# Patient Record
Sex: Male | Born: 1940 | Race: Black or African American | Hispanic: No | Marital: Married | Smoking: Never smoker
Health system: Southern US, Community
[De-identification: ages and names within clinical notes are randomized; demographics above are authoritative.]

## PROBLEM LIST (undated history)

## (undated) DIAGNOSIS — IMO0001 Reserved for inherently not codable concepts without codable children: Secondary | ICD-10-CM

## (undated) DIAGNOSIS — C801 Malignant (primary) neoplasm, unspecified: Secondary | ICD-10-CM

## (undated) DIAGNOSIS — IMO0002 Reserved for concepts with insufficient information to code with codable children: Secondary | ICD-10-CM

## (undated) DIAGNOSIS — I639 Cerebral infarction, unspecified: Secondary | ICD-10-CM

## (undated) DIAGNOSIS — I4891 Unspecified atrial fibrillation: Secondary | ICD-10-CM

## (undated) DIAGNOSIS — E7800 Pure hypercholesterolemia, unspecified: Secondary | ICD-10-CM

## (undated) DIAGNOSIS — N189 Chronic kidney disease, unspecified: Secondary | ICD-10-CM

## (undated) DIAGNOSIS — M1990 Unspecified osteoarthritis, unspecified site: Secondary | ICD-10-CM

## (undated) DIAGNOSIS — I2510 Atherosclerotic heart disease of native coronary artery without angina pectoris: Secondary | ICD-10-CM

## (undated) DIAGNOSIS — I10 Essential (primary) hypertension: Secondary | ICD-10-CM

## (undated) HISTORY — DX: Reserved for concepts with insufficient information to code with codable children: IMO0002

## (undated) HISTORY — PX: KNEE ARTHROSCOPY: SUR90

## (undated) HISTORY — PX: JOINT REPLACEMENT: SHX530

## (undated) HISTORY — DX: Pure hypercholesterolemia, unspecified: E78.00

## (undated) HISTORY — PX: OTHER SURGICAL HISTORY: SHX169

## (undated) HISTORY — DX: Unspecified osteoarthritis, unspecified site: M19.90

## (undated) HISTORY — DX: Essential (primary) hypertension: I10

## (undated) HISTORY — DX: Reserved for inherently not codable concepts without codable children: IMO0001

---

## 1999-09-11 ENCOUNTER — Other Ambulatory Visit: Admission: RE | Admit: 1999-09-11 | Discharge: 1999-09-11 | Payer: Self-pay | Admitting: Urology

## 1999-12-11 ENCOUNTER — Encounter: Payer: Self-pay | Admitting: Family Medicine

## 1999-12-11 ENCOUNTER — Ambulatory Visit (HOSPITAL_COMMUNITY): Admission: RE | Admit: 1999-12-11 | Discharge: 1999-12-11 | Payer: Self-pay | Admitting: Family Medicine

## 2000-07-21 ENCOUNTER — Encounter (INDEPENDENT_AMBULATORY_CARE_PROVIDER_SITE_OTHER): Payer: Self-pay | Admitting: Specialist

## 2000-07-21 ENCOUNTER — Other Ambulatory Visit: Admission: RE | Admit: 2000-07-21 | Discharge: 2000-07-21 | Payer: Self-pay | Admitting: Urology

## 2001-06-28 ENCOUNTER — Ambulatory Visit (HOSPITAL_COMMUNITY): Admission: RE | Admit: 2001-06-28 | Discharge: 2001-06-28 | Payer: Self-pay | Admitting: Family Medicine

## 2001-06-28 ENCOUNTER — Encounter: Payer: Self-pay | Admitting: Family Medicine

## 2001-08-02 ENCOUNTER — Ambulatory Visit (HOSPITAL_COMMUNITY): Admission: RE | Admit: 2001-08-02 | Discharge: 2001-08-02 | Payer: Self-pay | Admitting: Family Medicine

## 2001-08-02 ENCOUNTER — Encounter: Payer: Self-pay | Admitting: Family Medicine

## 2006-11-01 ENCOUNTER — Ambulatory Visit: Payer: Self-pay | Admitting: Gastroenterology

## 2006-11-19 ENCOUNTER — Emergency Department: Payer: Self-pay

## 2008-04-20 ENCOUNTER — Emergency Department: Payer: Self-pay | Admitting: Emergency Medicine

## 2010-05-18 ENCOUNTER — Ambulatory Visit
Admission: RE | Admit: 2010-05-18 | Discharge: 2010-05-18 | Payer: Self-pay | Source: Home / Self Care | Attending: Urology | Admitting: Urology

## 2010-05-18 LAB — POCT I-STAT 4, (NA,K, GLUC, HGB,HCT)
Glucose, Bld: 87 mg/dL (ref 70–99)
HCT: 42 % (ref 39.0–52.0)
Hemoglobin: 14.3 g/dL (ref 13.0–17.0)
Potassium: 4 mEq/L (ref 3.5–5.1)
Sodium: 143 mEq/L (ref 135–145)

## 2010-05-18 NOTE — Op Note (Signed)
  Mark [name], Mark [name]             ACCOUNT NO.:  [id number]  MEDICAL RECORD NO.:  [id number]          PATIENT TYPE:  AMB  LOCATION:  [facility]                         FACILITY:  [facility]  PHYSICIAN:  Mark C. Vernie Ammons, M.D.  DATE OF BIRTH:  [birth year]  DATE OF PROCEDURE:  05/18/2010 DATE OF DISCHARGE:                              OPERATIVE REPORT   PREOPERATIVE DIAGNOSIS:  Elevated prostate-specific antigen.  POSTOPERATIVE DIAGNOSIS:  Elevated prostate-specific antigen.  PROCEDURE:  Transrectal ultrasound and biopsy of the prostate.  SURGEON:  Mark C. Vernie Ammons, M.D.  ANESTHESIA:  General.  BLOOD LOSS:  Minimal.  SPECIMENS:  Prostate cores to Pathology.  COMPLICATIONS:  None.  INDICATIONS:  The patient is a 70 year old male with a long history of elevated PSA.  He underwent transrectal ultrasound and biopsy of his prostate first in 2000 and then 2002 and finally a third time in January 2010 for an elevated PSA.  His last biopsy was due to a PSA of 20.3 while on a 5-alpha-reductase inhibitor.  None of the biopsies revealed any abnormality.  He does have an enlarged prostate and we therefore have discussed a repeat biopsy with cores obtained from his transition zone in order to rule out a transition zone cancer.  His most recent PSA was greater than 30 on Avodart.  The risks, complications, alternatives, and limitations have been discussed.  He understands and elected to proceed.  DESCRIPTION OF OPERATION:  After informed consent, the patient was brought to the major OR, placed on the table, and administered general anesthesia and then moved to the lateral position.  He had received preoperative antibiotics, and I proceeded with transrectal ultrasound using a 10 MHz probe.  Scanning from base to apex in the axial plane, I note hypertrophy of the transition zone.  The peripheral zone appeared to be free of hyper or hypoechoic lesions.  The capsule appears intact throughout.   Additional scanning sagittally revealed no further findings.  The seminal vesicles appear symmetric and uninvolved with disease process.  His prostate length measured 5.67 cm, the height was 4.66 cm, and the width was 5.96 cm, giving a prostate volume of 82.6 cc.  Transrectal ultrasound-guided biopsies were then obtained.  I obtained 12 cores in the standard fashion, but all of these were obtained by advancing the needle through the peripheral zone and into the transition zone, where biopsies were obtained of the deep transition zone.  This was well tolerated, with no immediate complication or bleeding.  He will be taken to recovery room and observed and then discharged, with followup in my office in 1 week to go over the results of this prostate biopsy.  He will remain on his antibiotics and was given written instructions upon discharge.     Mark C. Vernie Ammons, M.D.     MCO/MEDQ  D:  05/18/2010  T:  05/18/2010  Job:  [id number]  Electronically Signed by [name] M.D. on 05/18/2010 06:15:45 PM

## 2010-05-26 ENCOUNTER — Other Ambulatory Visit (HOSPITAL_COMMUNITY): Payer: Self-pay | Admitting: Urology

## 2010-05-26 DIAGNOSIS — C61 Malignant neoplasm of prostate: Secondary | ICD-10-CM

## 2010-06-08 ENCOUNTER — Ambulatory Visit (HOSPITAL_COMMUNITY)
Admission: RE | Admit: 2010-06-08 | Discharge: 2010-06-08 | Disposition: A | Discharge status: Home / Self Care | Payer: Medicare Other | Source: Ambulatory Visit | Attending: Urology | Admitting: Urology

## 2010-06-08 ENCOUNTER — Encounter (HOSPITAL_COMMUNITY): Payer: Self-pay

## 2010-06-08 ENCOUNTER — Encounter (HOSPITAL_COMMUNITY)
Admission: RE | Admit: 2010-06-08 | Discharge: 2010-06-08 | Disposition: A | Discharge status: Home / Self Care | Payer: Medicare Other | Source: Ambulatory Visit | Attending: Urology | Admitting: Urology

## 2010-06-08 DIAGNOSIS — C61 Malignant neoplasm of prostate: Secondary | ICD-10-CM | POA: Insufficient documentation

## 2010-06-08 HISTORY — DX: Malignant (primary) neoplasm, unspecified: C80.1

## 2010-06-08 MED ORDER — TECHNETIUM TC 99M MEDRONATE IV KIT
22.9000 | PACK | Freq: Once | INTRAVENOUS | Status: AC | PRN
  Administered 2010-06-08: 22.9 via INTRAVENOUS

## 2010-06-24 ENCOUNTER — Ambulatory Visit: Payer: Medicare Other | Attending: Radiation Oncology | Admitting: Radiation Oncology

## 2010-06-24 DIAGNOSIS — Z79899 Other long term (current) drug therapy: Secondary | ICD-10-CM | POA: Insufficient documentation

## 2010-06-24 DIAGNOSIS — C61 Malignant neoplasm of prostate: Secondary | ICD-10-CM | POA: Insufficient documentation

## 2010-06-24 DIAGNOSIS — C7951 Secondary malignant neoplasm of bone: Secondary | ICD-10-CM | POA: Insufficient documentation

## 2010-06-24 DIAGNOSIS — C7952 Secondary malignant neoplasm of bone marrow: Secondary | ICD-10-CM | POA: Insufficient documentation

## 2010-06-24 DIAGNOSIS — I10 Essential (primary) hypertension: Secondary | ICD-10-CM | POA: Insufficient documentation

## 2010-06-24 DIAGNOSIS — E7800 Pure hypercholesterolemia, unspecified: Secondary | ICD-10-CM | POA: Insufficient documentation

## 2010-09-22 ENCOUNTER — Ambulatory Visit
Admission: RE | Admit: 2010-09-22 | Discharge: 2010-09-22 | Disposition: A | Discharge status: Home / Self Care | Payer: Medicare Other | Source: Ambulatory Visit | Attending: Radiation Oncology | Admitting: Radiation Oncology

## 2010-09-22 DIAGNOSIS — R350 Frequency of micturition: Secondary | ICD-10-CM | POA: Insufficient documentation

## 2010-09-22 DIAGNOSIS — C61 Malignant neoplasm of prostate: Secondary | ICD-10-CM | POA: Insufficient documentation

## 2010-09-22 DIAGNOSIS — Z510 Encounter for antineoplastic radiation therapy: Secondary | ICD-10-CM | POA: Insufficient documentation

## 2010-09-22 DIAGNOSIS — R300 Dysuria: Secondary | ICD-10-CM | POA: Insufficient documentation

## 2010-10-06 ENCOUNTER — Emergency Department: Payer: Self-pay | Admitting: *Deleted

## 2010-10-08 ENCOUNTER — Emergency Department: Payer: Self-pay | Admitting: Emergency Medicine

## 2010-10-09 ENCOUNTER — Emergency Department: Payer: Self-pay | Admitting: Emergency Medicine

## 2010-10-10 ENCOUNTER — Emergency Department: Payer: Self-pay | Admitting: Internal Medicine

## 2010-10-13 ENCOUNTER — Emergency Department (HOSPITAL_COMMUNITY)
Admission: EM | Admit: 2010-10-13 | Discharge: 2010-10-13 | Disposition: A | Discharge status: Home / Self Care | Payer: Medicare Other | Attending: Emergency Medicine | Admitting: Emergency Medicine

## 2010-10-13 DIAGNOSIS — I10 Essential (primary) hypertension: Secondary | ICD-10-CM | POA: Insufficient documentation

## 2010-10-13 DIAGNOSIS — E7800 Pure hypercholesterolemia, unspecified: Secondary | ICD-10-CM | POA: Insufficient documentation

## 2010-10-13 DIAGNOSIS — Z8546 Personal history of malignant neoplasm of prostate: Secondary | ICD-10-CM | POA: Insufficient documentation

## 2010-10-13 DIAGNOSIS — Z0389 Encounter for observation for other suspected diseases and conditions ruled out: Secondary | ICD-10-CM | POA: Insufficient documentation

## 2010-10-22 ENCOUNTER — Other Ambulatory Visit: Payer: Self-pay | Admitting: Urology

## 2010-10-22 ENCOUNTER — Ambulatory Visit (HOSPITAL_BASED_OUTPATIENT_CLINIC_OR_DEPARTMENT_OTHER)
Admission: RE | Admit: 2010-10-22 | Discharge: 2010-10-22 | Disposition: A | Discharge status: Home / Self Care | Payer: Medicare Other | Source: Ambulatory Visit | Attending: Urology | Admitting: Urology

## 2010-10-22 DIAGNOSIS — Z01812 Encounter for preprocedural laboratory examination: Secondary | ICD-10-CM | POA: Insufficient documentation

## 2010-10-22 DIAGNOSIS — E7800 Pure hypercholesterolemia, unspecified: Secondary | ICD-10-CM | POA: Insufficient documentation

## 2010-10-22 DIAGNOSIS — I10 Essential (primary) hypertension: Secondary | ICD-10-CM | POA: Insufficient documentation

## 2010-10-22 DIAGNOSIS — N401 Enlarged prostate with lower urinary tract symptoms: Secondary | ICD-10-CM | POA: Insufficient documentation

## 2010-10-22 DIAGNOSIS — N138 Other obstructive and reflux uropathy: Secondary | ICD-10-CM | POA: Insufficient documentation

## 2010-10-22 DIAGNOSIS — R339 Retention of urine, unspecified: Secondary | ICD-10-CM | POA: Insufficient documentation

## 2010-10-22 DIAGNOSIS — C61 Malignant neoplasm of prostate: Secondary | ICD-10-CM | POA: Insufficient documentation

## 2010-10-22 LAB — POCT I-STAT, CHEM 8
BUN: 20 mg/dL (ref 6–23)
Calcium, Ion: 1.23 mmol/L (ref 1.12–1.32)
Chloride: 105 mEq/L (ref 96–112)
Creatinine, Ser: 1.1 mg/dL (ref 0.50–1.35)
Glucose, Bld: 98 mg/dL (ref 70–99)
HCT: 40 % (ref 39.0–52.0)
Hemoglobin: 13.6 g/dL (ref 13.0–17.0)
Potassium: 4 mEq/L (ref 3.5–5.1)
Sodium: 143 mEq/L (ref 135–145)
TCO2: 29 mmol/L (ref 0–100)

## 2010-10-28 NOTE — Op Note (Signed)
NAMEWACO, Mark [name]             ACCOUNT NO.:  [id number]  MEDICAL RECORD NO.:  [id number]  LOCATION:                                 FACILITY:  PHYSICIAN:  Shylynn Bruning C. Vernie Ammons, M.D.  DATE OF BIRTH:  08/13/1940  DATE OF PROCEDURE:  10/22/2010 DATE OF DISCHARGE:                              OPERATIVE REPORT   PREOPERATIVE DIAGNOSES: 1. Urinary retention. 2. Benign prostatic hypertrophy.  PROCEDURE: 1. Transurethral incision of prostate. 2. Open suprapubic tube placement.  SURGEON:  [name] C. Vernie Ammons, M.D.  ANESTHESIA:  General.  BLOOD LOSS:  Minimal.  DRAINS:  An 18-French Foley catheter as a suprapubic tube.  SPECIMENS:  TUR chips to pathology.  BLOOD LOSS:  Minimal.  COMPLICATIONS:  None.  INDICATIONS:  The patient is a 70 year old male who has BPH with outlet obstructive voiding symptoms who was diagnosed with adenocarcinoma of the prostate.  He began external beam radiation therapy and subsequently developed urinary retention.  Attempts at medical management were undertaken but he has failed medical management and because a Foley catheter per urethra during radiation therapy would place him at increased risk of urethral stricture disease, we discussed placement of the suprapubic tube and transurethral incision of the prostate.  The risks, complications, alternatives, and limitations were discussed.  He understands and elected to proceed.  A culture preoperatively from his catheter was negative.  He received ampicillin and gentamicin preoperatively as well.  DESCRIPTION OF OPERATION:  After informed consent, the patient was brought to major OR, placed on table, and administered general anesthesia.  He was then moved to the dorsal lithotomy position and his genitalia was sterilely prepped and draped.  Initially, I passed the 28- French resectoscope sheath with [name] into the bladder and removed the obturator inserting the 12-degree lens with  resectoscope element.  The bladder was noted to be trabeculated.  There was irritation from the catheter.  His prostatic urethra revealed bilobar hypertrophy.  I began my incision at the level of the bladder neck using the loop and incised at the 6 o'clock position back to the level of the verumontanum. This was taken down to near the surgical capsule and bleeding points were then cauterized.  The prostate chips were then removed from the bladder and sent to pathology.  The bladder was then filled to capacity and the patient was placed in Trendelenburg position.  I first passed a spinal needle 2 fingerbreadths above the symphysis pubis and visualized this with the cystoscope and the bladder full.  I therefore infiltrated this area with 0.5% Marcaine with epinephrine and then made a midline incision approximately 1 cm in length.  I then removed the cystoscope and with the bladder full passed the Lowsley retractor per urethra and was able to palpate this through the incision in the lower abdomen.  I then cut on top of the Lowsley retractor and passed this through the bladder and out through the skin incision.  An 18-French Foley catheter was then grasped in the retractors jaws and withdrawn into the bladder and out the urethra.  I released the catheter and then reinserted the resectoscope and advanced the catheter back through the urethra and into the bladder  under direct vision.  I then filled the balloon with 10 cc of water.  I then removed the resectoscope and placed a 24-French Foley catheter.  Irrigation was then connected to the suprapubic tube and continuous bladder irrigation was begun while the patient is in the recovery room.  Prior to his discharge, the Foley catheter in his urethra will be removed and the suprapubic tube will be left connected to close system drainage.  He will be given a prescription for Cipro 500 mg b.i.d. #14 and Vicodin HP #28 with written instructions  given and follow up in my office in 1 week.     Annalese Stiner C. Vernie Ammons, M.D.     MCO/MEDQ  D:  10/22/2010  T:  10/22/2010  Job:  [id number]  Electronically Signed by [name] M.D. on 10/28/2010 04:48:45 AM

## 2011-01-04 ENCOUNTER — Ambulatory Visit
Admission: RE | Admit: 2011-01-04 | Discharge: 2011-01-04 | Disposition: A | Discharge status: Home / Self Care | Payer: Medicare Other | Source: Ambulatory Visit | Attending: Radiation Oncology | Admitting: Radiation Oncology

## 2011-06-28 ENCOUNTER — Ambulatory Visit: Payer: Medicare Other | Admitting: Radiation Oncology

## 2011-07-03 ENCOUNTER — Encounter: Payer: Self-pay | Admitting: Radiation Oncology

## 2011-07-05 ENCOUNTER — Encounter: Payer: Self-pay | Admitting: Radiation Oncology

## 2011-07-05 ENCOUNTER — Ambulatory Visit
Admission: RE | Admit: 2011-07-05 | Discharge: 2011-07-05 | Disposition: A | Discharge status: Home / Self Care | Payer: Medicare Other | Source: Ambulatory Visit | Attending: Radiation Oncology | Admitting: Radiation Oncology

## 2011-07-05 VITALS — BP 131/79 | HR 62 | Temp 97.6°F | Wt 206.2 lb

## 2011-07-05 DIAGNOSIS — C61 Malignant neoplasm of prostate: Secondary | ICD-10-CM | POA: Insufficient documentation

## 2011-07-05 NOTE — Progress Notes (Signed)
Here for routine follow up post completion of pelvic radiation of in August of 2012. Intermittent burning on urination. No frequency. Nocturia x 2.

## 2011-07-05 NOTE — Progress Notes (Signed)
CC:   Mark [name], M.D. Mark [name], M.D.  DIAGNOSIS:  Prostate cancer.  INTERVAL SINCE RADIATION THERAPY:  Seven months.  NARRATIVE:  Mark [name] comes in today for routine followup.  He clinically seems to be doing reasonably well.  The patient has noticed some mild dysuria, but denies any hematuria.  The patient's nocturia has improved significantly.  He has nocturia approximately 2 to 3 times. The patient denies any rectal bleeding or discomfort with bowel movements.  The patient was able to have his bladder catheter removed approximately a month after his radiation therapy was completed.  I am unsure whether the patient has had a PSA since completion of his therapy, but have requested this information from Dr. Margrett [name] office. The patient will be seeing Mark [name] in approximately a month.  PHYSICAL EXAMINATION:  Vital Signs:  The patient's temperature is 97.6, pulse 62, blood pressure is 131/79, weight is 206 pounds.  Lymph: Examination of the neck and supraclavicular region reveals no evidence of adenopathy.  The axillary areas are free of adenopathy.  Lungs: Examination of the lungs reveals them to be clear.  Heart:  Has a regular rhythm and rate.  Abdomen:  Soft and nontender with normal bowel sounds.  Rectal:  A rectal exam is not performed in light of the upcoming exam with Mark [name].  IMPRESSION AND PLAN:  The patient is doing well at this time except for intermittent dysuria.  I have recommended the patient present to Dr. Margrett [name] office today for a UA, C and S for further evaluation of this issue.  Otherwise the patient will see Mark [name] in April.  In light of the patient's close followup with Urology, I have not scheduled Mark [name] for a formal followup appointment, but would be glad to see him at any time.    ______________________________ Mark [name], Ph.D., M.D. JDK/MEDQ  D:  07/05/2011  T:  07/05/2011  Job:  [id number]

## 2013-12-14 ENCOUNTER — Ambulatory Visit: Payer: Self-pay | Admitting: Nephrology

## 2014-08-12 ENCOUNTER — Encounter: Payer: Self-pay | Admitting: Emergency Medicine

## 2014-08-12 DIAGNOSIS — Z8546 Personal history of malignant neoplasm of prostate: Secondary | ICD-10-CM | POA: Diagnosis not present

## 2014-08-20 DIAGNOSIS — Z8546 Personal history of malignant neoplasm of prostate: Secondary | ICD-10-CM | POA: Diagnosis not present

## 2014-09-26 ENCOUNTER — Ambulatory Visit: Payer: Self-pay | Admitting: Family Medicine

## 2015-02-18 DIAGNOSIS — Z8546 Personal history of malignant neoplasm of prostate: Secondary | ICD-10-CM | POA: Diagnosis not present

## 2015-02-25 DIAGNOSIS — Z8546 Personal history of malignant neoplasm of prostate: Secondary | ICD-10-CM | POA: Diagnosis not present

## 2015-02-25 DIAGNOSIS — R3121 Asymptomatic microscopic hematuria: Secondary | ICD-10-CM | POA: Diagnosis not present

## 2015-05-12 DIAGNOSIS — R69 Illness, unspecified: Secondary | ICD-10-CM | POA: Diagnosis not present

## 2015-07-15 DIAGNOSIS — Z0100 Encounter for examination of eyes and vision without abnormal findings: Secondary | ICD-10-CM | POA: Diagnosis not present

## 2015-08-18 DIAGNOSIS — N401 Enlarged prostate with lower urinary tract symptoms: Secondary | ICD-10-CM | POA: Diagnosis not present

## 2015-08-18 DIAGNOSIS — Z8546 Personal history of malignant neoplasm of prostate: Secondary | ICD-10-CM | POA: Diagnosis not present

## 2015-08-25 DIAGNOSIS — Z0000 Encounter for general adult medical examination without abnormal findings: Secondary | ICD-10-CM | POA: Diagnosis not present

## 2015-08-25 DIAGNOSIS — Z8546 Personal history of malignant neoplasm of prostate: Secondary | ICD-10-CM | POA: Diagnosis not present

## 2015-11-10 DIAGNOSIS — R69 Illness, unspecified: Secondary | ICD-10-CM | POA: Diagnosis not present

## 2015-11-24 DIAGNOSIS — R69 Illness, unspecified: Secondary | ICD-10-CM | POA: Diagnosis not present

## 2016-02-02 ENCOUNTER — Encounter: Payer: Self-pay | Admitting: Family Medicine

## 2016-02-02 ENCOUNTER — Ambulatory Visit (INDEPENDENT_AMBULATORY_CARE_PROVIDER_SITE_OTHER): Payer: Medicare HMO | Admitting: Family Medicine

## 2016-02-02 VITALS — BP 140/80 | HR 66 | Temp 98.4°F | Resp 16 | Ht 67.0 in | Wt 203.6 lb

## 2016-02-02 DIAGNOSIS — M25512 Pain in left shoulder: Secondary | ICD-10-CM | POA: Diagnosis not present

## 2016-02-02 DIAGNOSIS — G8929 Other chronic pain: Secondary | ICD-10-CM | POA: Diagnosis not present

## 2016-02-02 DIAGNOSIS — I10 Essential (primary) hypertension: Secondary | ICD-10-CM | POA: Diagnosis not present

## 2016-02-02 DIAGNOSIS — Z021 Encounter for pre-employment examination: Secondary | ICD-10-CM | POA: Diagnosis not present

## 2016-02-02 DIAGNOSIS — E7800 Pure hypercholesterolemia, unspecified: Secondary | ICD-10-CM

## 2016-02-02 DIAGNOSIS — Z111 Encounter for screening for respiratory tuberculosis: Secondary | ICD-10-CM

## 2016-02-02 LAB — LIPID PANEL
Cholesterol: 196 mg/dL (ref 125–200)
HDL: 56 mg/dL (ref >=40)
LDL Cholesterol: 111 mg/dL (ref <130)
Total CHOL/HDL Ratio: 3.5 Ratio (ref <=5.0)
Triglycerides: 147 mg/dL (ref <150)
VLDL: 29 mg/dL (ref <30)

## 2016-02-02 LAB — COMPLETE METABOLIC PANEL WITH GFR
ALT: 18 U/L (ref 9–46)
AST: 30 U/L (ref 10–35)
Albumin: 4.1 g/dL (ref 3.6–5.1)
Alkaline Phosphatase: 59 U/L (ref 40–115)
BUN: 16 mg/dL (ref 7–25)
CO2: 27 mmol/L (ref 20–31)
Calcium: 9.4 mg/dL (ref 8.6–10.3)
Chloride: 106 mmol/L (ref 98–110)
Creat: 1.25 mg/dL — ABNORMAL HIGH (ref 0.70–1.18)
GFR, Est African American: 65 mL/min (ref >=60)
GFR, Est Non African American: 56 mL/min — ABNORMAL LOW (ref >=60)
Glucose, Bld: 95 mg/dL (ref 65–99)
Potassium: 4.8 mmol/L (ref 3.5–5.3)
Sodium: 142 mmol/L (ref 135–146)
Total Bilirubin: 1.1 mg/dL (ref 0.2–1.2)
Total Protein: 7.2 g/dL (ref 6.1–8.1)

## 2016-02-02 NOTE — Progress Notes (Signed)
Name: Mark [name]   MRN: [id number]    DOB: 04-24-1940   Date:02/02/2016       Progress Note  Subjective  Chief Complaint  Chief Complaint  Patient presents with  . Hypertension    follow up  . Hyperlipidemia  This patient is usually followed by Dr. Rutherford Nail, new to me  Hypertension  This is a chronic problem. The problem is unchanged. The problem is controlled. Pertinent negatives include no blurred vision, chest pain, headaches, palpitations or shortness of breath. Past treatments include ACE inhibitors and calcium channel blockers. There is no history of kidney disease, CAD/MI or CVA.  Hyperlipidemia  This is a chronic problem. The current episode started more than 1 year ago. The problem is controlled (No recent Lipid Panel results available.). Pertinent negatives include no chest pain, leg pain, myalgias or shortness of breath. Current antihyperlipidemic treatment includes statins.  Shoulder Pain   The pain is present in the left shoulder. This is a new problem. The current episode started more than 1 month ago (2 months ago). There has been no history of extremity trauma. The problem has been gradually worsening. The quality of the pain is described as sharp. The pain is at a severity of 6/10. Pertinent negatives include no stiffness. The symptoms are aggravated by activity (worse with playing golf). He has tried nothing for the symptoms. His past medical history is significant for osteoarthritis.    Pt. Presents for completing his medical report to be employed as a Oceanographer for pre-school at [employer]. He is a new patient to me and a complete review of history was obtained.    Past Medical History:  Diagnosis Date  . Arthritis   . Cancer ([place])   . Hypercholesterolemia   . Hypertension   . Radiation 09/30/2008-12/02/2010   7800 cGy  Dr.Ottelin    Past Surgical History:  Procedure Laterality Date  . left knee replacement      No family history on  file.  Social History   Social History  . Marital status: Married    Spouse name: N/A  . Number of children: N/A  . Years of education: N/A   Occupational History  . RETIRED [employer]  . SELF-EMPLOYED     DIGITAL PHONE/SECURITY   Social History Main Topics  . Smoking status: Never Smoker  . Smokeless tobacco: Never Used  . Alcohol use No  . Drug use: No  . Sexual activity: Not on file   Other Topics Concern  . Not on file   Social History Narrative  . No narrative on file     Current Outpatient Prescriptions:  .  amLODipine (NORVASC) 10 MG tablet, Take by mouth., Disp: , Rfl:  .  atorvastatin (LIPITOR) 20 MG tablet, Take 40 mg by mouth daily., Disp: , Rfl:  .  finasteride (PROSCAR) 5 MG tablet, Take 5 mg by mouth daily., Disp: , Rfl:  .  lisinopril (PRINIVIL,ZESTRIL) 40 MG tablet, Take by mouth., Disp: , Rfl:   No Known Allergies   Review of Systems  Eyes: Negative for blurred vision.  Respiratory: Negative for shortness of breath.   Cardiovascular: Negative for chest pain and palpitations.  Musculoskeletal: Negative for myalgias and stiffness.  Neurological: Negative for headaches.    Objective  Vitals:   02/02/16 1008  BP: 140/80  Pulse: 66  Resp: 16  Temp: 98.4 F (36.9 C)  TempSrc: Oral  SpO2: 97%  Weight: 203 lb 9.6 oz (92.4 kg)  Height: 5\' 7"  (1.702 m)    Physical Exam  Constitutional: He is oriented to person, place, and time and well-developed, well-nourished, and in no distress.  Cardiovascular: Normal rate, regular rhythm, S1 normal, S2 normal and normal heart sounds.   No murmur heard. Pulmonary/Chest: Effort normal and breath sounds normal. No respiratory distress. He has no decreased breath sounds. He has no wheezes.  Abdominal: Soft. Bowel sounds are normal. There is no tenderness.  Musculoskeletal:       Left shoulder: He exhibits tenderness.       Right ankle: He exhibits no swelling.       Left ankle: He exhibits no swelling.   Mild tenderness to palpation over the anterior left shoulder, limited ROM  Neurological: He is alert and oriented to person, place, and time.  Skin: Skin is warm, dry and intact.  Psychiatric: Mood, memory, affect and judgment normal.  Nursing note and vitals reviewed.   Assessment & Plan  1. Essential hypertension BP stable on present antihypertensive therapy  2. Hypercholesterolemia Obtain FLP, continue on statin therapy - Lipid Profile - COMPLETE METABOLIC PANEL WITH GFR  3. Chronic left shoulder pain We'll rule out arthritis by obtaining an x-ray of left shoulder - DG Shoulder Left; Future  4. Encounter for pre-employment health screening examination Completed exam, ordered tuberculosis testing. Patient verbalized that he is able to perform the listed duties of his job  5. Tuberculosis screening  - Quantiferon tb gold assay  Note: Total face-to-face time 40 minutes, greater than 50% was spent in counseling and coordination of care with the patient. This included updating patient's past medical, surgical, family and social history, following up on multiple acute and chronic problems, reviewing his school form and administrating the tuberculosis questionnaire and the exam. Sena Hoopingarner [facility] 02/02/2016 10:38 AM

## 2016-02-04 ENCOUNTER — Telehealth: Payer: Self-pay | Admitting: Family Medicine

## 2016-02-04 LAB — QUANTIFERON TB GOLD ASSAY (BLOOD)
Interferon Gamma Release Assay: NEGATIVE
Mitogen-Nil: >10 IU/mL
Quantiferon Nil Value: 0.91 IU/mL
Quantiferon Tb Ag Minus Nil Value: <0 IU/mL

## 2016-02-04 NOTE — Telephone Encounter (Signed)
Pt is needing all his medications called into [facility] [address]

## 2016-02-10 ENCOUNTER — Telehealth: Payer: Self-pay

## 2016-02-10 MED ORDER — ATORVASTATIN CALCIUM 40 MG PO TABS: 40.0000 mg | ORAL_TABLET | Freq: Every day | ORAL | 0 refills | Status: DC

## 2016-02-10 NOTE — Telephone Encounter (Signed)
Please send to [facility] [address]

## 2016-02-10 NOTE — Telephone Encounter (Signed)
Patient has been notified of lab results and a prescription for atorvastatin 40 mg daily has been sent to [facility] [address] Dr. Manuella Ghazi, patient has been notified and verbalized understanding

## 2016-02-11 NOTE — Telephone Encounter (Signed)
Medication has been refilled and sent to [facility] [address] °

## 2016-02-19 ENCOUNTER — Other Ambulatory Visit: Payer: Self-pay | Admitting: Emergency Medicine

## 2016-02-19 MED ORDER — AMLODIPINE BESYLATE 10 MG PO TABS: 10.0000 mg | ORAL_TABLET | Freq: Every day | ORAL | 0 refills | Status: DC

## 2016-02-19 MED ORDER — LISINOPRIL 40 MG PO TABS: 40.0000 mg | ORAL_TABLET | Freq: Every day | ORAL | 0 refills | Status: DC

## 2016-03-18 DIAGNOSIS — N400 Enlarged prostate without lower urinary tract symptoms: Secondary | ICD-10-CM | POA: Diagnosis not present

## 2016-03-18 DIAGNOSIS — Z8546 Personal history of malignant neoplasm of prostate: Secondary | ICD-10-CM | POA: Diagnosis not present

## 2016-05-04 ENCOUNTER — Other Ambulatory Visit: Payer: Self-pay | Admitting: Emergency Medicine

## 2016-05-04 MED ORDER — AMLODIPINE BESYLATE 10 MG PO TABS: 10.0000 mg | ORAL_TABLET | Freq: Every day | ORAL | 0 refills | Status: DC

## 2016-05-04 MED ORDER — LISINOPRIL 40 MG PO TABS: 40.0000 mg | ORAL_TABLET | Freq: Every day | ORAL | 0 refills | Status: DC

## 2016-05-04 MED ORDER — TERAZOSIN HCL 10 MG PO CAPS: 10.0000 mg | ORAL_CAPSULE | Freq: Every day | ORAL | 0 refills | Status: DC

## 2016-05-04 MED ORDER — ATORVASTATIN CALCIUM 40 MG PO TABS
40.0000 mg | ORAL_TABLET | Freq: Every day | ORAL | 0 refills | Status: DC
Start: 2016-05-04 — End: 2020-12-15

## 2016-05-04 NOTE — Progress Notes (Unsigned)
er

## 2016-05-07 ENCOUNTER — Ambulatory Visit: Payer: Medicare HMO | Admitting: Family Medicine

## 2016-05-16 ENCOUNTER — Other Ambulatory Visit: Payer: Self-pay | Admitting: Family Medicine

## 2016-06-07 ENCOUNTER — Telehealth: Payer: Self-pay | Admitting: Family Medicine

## 2016-06-07 NOTE — Telephone Encounter (Signed)
Called Pt to schedule AWV with [facility] - knb °

## 2016-06-10 DIAGNOSIS — R69 Illness, unspecified: Secondary | ICD-10-CM | POA: Diagnosis not present

## 2016-07-12 DIAGNOSIS — R69 Illness, unspecified: Secondary | ICD-10-CM | POA: Diagnosis not present

## 2016-07-30 ENCOUNTER — Other Ambulatory Visit: Payer: Self-pay | Admitting: Family Medicine

## 2016-08-21 DIAGNOSIS — R69 Illness, unspecified: Secondary | ICD-10-CM | POA: Diagnosis not present

## 2016-08-25 ENCOUNTER — Other Ambulatory Visit: Payer: Self-pay | Admitting: Family Medicine

## 2016-08-25 ENCOUNTER — Ambulatory Visit
Admission: RE | Admit: 2016-08-25 | Discharge: 2016-08-25 | Disposition: A | Discharge status: Home / Self Care | Payer: Self-pay | Source: Ambulatory Visit | Attending: Family Medicine | Admitting: Family Medicine

## 2016-08-25 DIAGNOSIS — M25552 Pain in left hip: Secondary | ICD-10-CM

## 2016-08-25 DIAGNOSIS — M1612 Unilateral primary osteoarthritis, left hip: Secondary | ICD-10-CM | POA: Diagnosis not present

## 2016-09-16 DIAGNOSIS — R69 Illness, unspecified: Secondary | ICD-10-CM | POA: Diagnosis not present

## 2017-01-04 DIAGNOSIS — R69 Illness, unspecified: Secondary | ICD-10-CM | POA: Diagnosis not present

## 2017-01-10 DIAGNOSIS — R69 Illness, unspecified: Secondary | ICD-10-CM | POA: Diagnosis not present

## 2017-03-08 DIAGNOSIS — Z8546 Personal history of malignant neoplasm of prostate: Secondary | ICD-10-CM | POA: Diagnosis not present

## 2017-03-08 DIAGNOSIS — N400 Enlarged prostate without lower urinary tract symptoms: Secondary | ICD-10-CM | POA: Diagnosis not present

## 2017-03-21 DIAGNOSIS — R69 Illness, unspecified: Secondary | ICD-10-CM | POA: Diagnosis not present

## 2017-05-05 DIAGNOSIS — N400 Enlarged prostate without lower urinary tract symptoms: Secondary | ICD-10-CM | POA: Diagnosis not present

## 2017-05-05 DIAGNOSIS — R310 Gross hematuria: Secondary | ICD-10-CM | POA: Diagnosis not present

## 2017-08-29 DIAGNOSIS — R69 Illness, unspecified: Secondary | ICD-10-CM | POA: Diagnosis not present

## 2018-02-27 DIAGNOSIS — R351 Nocturia: Secondary | ICD-10-CM | POA: Diagnosis not present

## 2018-02-27 DIAGNOSIS — C61 Malignant neoplasm of prostate: Secondary | ICD-10-CM | POA: Diagnosis not present

## 2018-02-27 DIAGNOSIS — Z8546 Personal history of malignant neoplasm of prostate: Secondary | ICD-10-CM | POA: Diagnosis not present

## 2018-09-14 DIAGNOSIS — R69 Illness, unspecified: Secondary | ICD-10-CM | POA: Diagnosis not present

## 2019-02-19 DIAGNOSIS — C61 Malignant neoplasm of prostate: Secondary | ICD-10-CM | POA: Diagnosis not present

## 2019-03-05 DIAGNOSIS — R351 Nocturia: Secondary | ICD-10-CM | POA: Diagnosis not present

## 2019-03-05 DIAGNOSIS — Z8546 Personal history of malignant neoplasm of prostate: Secondary | ICD-10-CM | POA: Diagnosis not present

## 2019-03-26 DIAGNOSIS — R69 Illness, unspecified: Secondary | ICD-10-CM | POA: Diagnosis not present

## 2019-04-05 DIAGNOSIS — R69 Illness, unspecified: Secondary | ICD-10-CM | POA: Diagnosis not present

## 2019-06-25 DIAGNOSIS — I10 Essential (primary) hypertension: Secondary | ICD-10-CM | POA: Diagnosis not present

## 2019-06-25 DIAGNOSIS — N400 Enlarged prostate without lower urinary tract symptoms: Secondary | ICD-10-CM | POA: Diagnosis not present

## 2019-06-25 DIAGNOSIS — E785 Hyperlipidemia, unspecified: Secondary | ICD-10-CM | POA: Diagnosis not present

## 2019-07-26 DIAGNOSIS — M25512 Pain in left shoulder: Secondary | ICD-10-CM | POA: Diagnosis not present

## 2019-09-03 DIAGNOSIS — Z8546 Personal history of malignant neoplasm of prostate: Secondary | ICD-10-CM | POA: Diagnosis not present

## 2019-09-24 DIAGNOSIS — Z8546 Personal history of malignant neoplasm of prostate: Secondary | ICD-10-CM | POA: Diagnosis not present

## 2019-12-25 DIAGNOSIS — C61 Malignant neoplasm of prostate: Secondary | ICD-10-CM | POA: Diagnosis not present

## 2019-12-25 DIAGNOSIS — Z1389 Encounter for screening for other disorder: Secondary | ICD-10-CM | POA: Diagnosis not present

## 2019-12-25 DIAGNOSIS — Z0000 Encounter for general adult medical examination without abnormal findings: Secondary | ICD-10-CM | POA: Diagnosis not present

## 2019-12-25 DIAGNOSIS — I10 Essential (primary) hypertension: Secondary | ICD-10-CM | POA: Diagnosis not present

## 2019-12-25 DIAGNOSIS — E785 Hyperlipidemia, unspecified: Secondary | ICD-10-CM | POA: Diagnosis not present

## 2019-12-28 DIAGNOSIS — C61 Malignant neoplasm of prostate: Secondary | ICD-10-CM | POA: Diagnosis not present

## 2020-02-11 DIAGNOSIS — H524 Presbyopia: Secondary | ICD-10-CM | POA: Diagnosis not present

## 2020-03-25 DIAGNOSIS — E785 Hyperlipidemia, unspecified: Secondary | ICD-10-CM | POA: Diagnosis not present

## 2020-03-25 DIAGNOSIS — K5900 Constipation, unspecified: Secondary | ICD-10-CM | POA: Diagnosis not present

## 2020-03-25 DIAGNOSIS — Z6832 Body mass index (BMI) 32.0-32.9, adult: Secondary | ICD-10-CM | POA: Diagnosis not present

## 2020-03-25 DIAGNOSIS — Z008 Encounter for other general examination: Secondary | ICD-10-CM | POA: Diagnosis not present

## 2020-03-25 DIAGNOSIS — M1990 Unspecified osteoarthritis, unspecified site: Secondary | ICD-10-CM | POA: Diagnosis not present

## 2020-03-25 DIAGNOSIS — E669 Obesity, unspecified: Secondary | ICD-10-CM | POA: Diagnosis not present

## 2020-03-25 DIAGNOSIS — Z809 Family history of malignant neoplasm, unspecified: Secondary | ICD-10-CM | POA: Diagnosis not present

## 2020-03-25 DIAGNOSIS — Z7722 Contact with and (suspected) exposure to environmental tobacco smoke (acute) (chronic): Secondary | ICD-10-CM | POA: Diagnosis not present

## 2020-03-25 DIAGNOSIS — I10 Essential (primary) hypertension: Secondary | ICD-10-CM | POA: Diagnosis not present

## 2020-03-31 DIAGNOSIS — C61 Malignant neoplasm of prostate: Secondary | ICD-10-CM | POA: Diagnosis not present

## 2020-03-31 DIAGNOSIS — N400 Enlarged prostate without lower urinary tract symptoms: Secondary | ICD-10-CM | POA: Diagnosis not present

## 2020-04-07 ENCOUNTER — Other Ambulatory Visit: Payer: Self-pay | Admitting: Urology

## 2020-04-07 ENCOUNTER — Other Ambulatory Visit (HOSPITAL_COMMUNITY): Payer: Self-pay | Admitting: Urology

## 2020-04-07 DIAGNOSIS — C61 Malignant neoplasm of prostate: Secondary | ICD-10-CM

## 2020-04-15 ENCOUNTER — Ambulatory Visit (HOSPITAL_COMMUNITY): Payer: Medicare HMO

## 2020-04-15 DIAGNOSIS — C61 Malignant neoplasm of prostate: Secondary | ICD-10-CM | POA: Diagnosis not present

## 2020-04-15 DIAGNOSIS — K7689 Other specified diseases of liver: Secondary | ICD-10-CM | POA: Diagnosis not present

## 2020-04-15 DIAGNOSIS — N400 Enlarged prostate without lower urinary tract symptoms: Secondary | ICD-10-CM | POA: Diagnosis not present

## 2020-04-15 DIAGNOSIS — K5730 Diverticulosis of large intestine without perforation or abscess without bleeding: Secondary | ICD-10-CM | POA: Diagnosis not present

## 2020-04-21 ENCOUNTER — Encounter (HOSPITAL_COMMUNITY)
Admission: RE | Admit: 2020-04-21 | Discharge: 2020-04-21 | Disposition: A | Discharge status: Home / Self Care | Payer: Medicare HMO | Source: Ambulatory Visit | Attending: Urology | Admitting: Urology

## 2020-04-21 ENCOUNTER — Other Ambulatory Visit: Payer: Self-pay

## 2020-04-21 DIAGNOSIS — C61 Malignant neoplasm of prostate: Secondary | ICD-10-CM | POA: Diagnosis not present

## 2020-04-21 MED ORDER — TECHNETIUM TC 99M MEDRONATE IV KIT
22.0000 | PACK | Freq: Once | INTRAVENOUS | Status: AC | PRN
  Administered 2020-04-21: 22 via INTRAVENOUS

## 2020-04-25 DIAGNOSIS — C61 Malignant neoplasm of prostate: Secondary | ICD-10-CM | POA: Diagnosis not present

## 2020-05-13 DIAGNOSIS — I10 Essential (primary) hypertension: Secondary | ICD-10-CM | POA: Diagnosis not present

## 2020-05-13 DIAGNOSIS — S8012XA Contusion of left lower leg, initial encounter: Secondary | ICD-10-CM | POA: Diagnosis not present

## 2020-05-13 DIAGNOSIS — W009XXA Unspecified fall due to ice and snow, initial encounter: Secondary | ICD-10-CM | POA: Diagnosis not present

## 2020-06-23 DIAGNOSIS — L918 Other hypertrophic disorders of the skin: Secondary | ICD-10-CM | POA: Diagnosis not present

## 2020-06-23 DIAGNOSIS — E7800 Pure hypercholesterolemia, unspecified: Secondary | ICD-10-CM | POA: Diagnosis not present

## 2020-06-23 DIAGNOSIS — I10 Essential (primary) hypertension: Secondary | ICD-10-CM | POA: Diagnosis not present

## 2020-06-23 DIAGNOSIS — C61 Malignant neoplasm of prostate: Secondary | ICD-10-CM | POA: Diagnosis not present

## 2020-11-10 DIAGNOSIS — W109XXA Fall (on) (from) unspecified stairs and steps, initial encounter: Secondary | ICD-10-CM | POA: Diagnosis not present

## 2020-11-10 DIAGNOSIS — M25561 Pain in right knee: Secondary | ICD-10-CM | POA: Diagnosis not present

## 2020-11-10 DIAGNOSIS — T1490XA Injury, unspecified, initial encounter: Secondary | ICD-10-CM | POA: Diagnosis not present

## 2020-11-10 DIAGNOSIS — S8991XA Unspecified injury of right lower leg, initial encounter: Secondary | ICD-10-CM | POA: Diagnosis not present

## 2020-11-10 DIAGNOSIS — Z743 Need for continuous supervision: Secondary | ICD-10-CM | POA: Diagnosis not present

## 2020-12-11 ENCOUNTER — Other Ambulatory Visit: Payer: Self-pay | Admitting: Orthopedic Surgery

## 2020-12-11 ENCOUNTER — Other Ambulatory Visit: Payer: Self-pay

## 2020-12-11 ENCOUNTER — Encounter
Admission: RE | Admit: 2020-12-11 | Discharge: 2020-12-11 | Disposition: A | Discharge status: Home / Self Care | Payer: No Typology Code available for payment source | Source: Ambulatory Visit | Attending: Orthopedic Surgery | Admitting: Orthopedic Surgery

## 2020-12-11 ENCOUNTER — Encounter: Payer: Self-pay | Admitting: Orthopedic Surgery

## 2020-12-11 HISTORY — DX: Atherosclerotic heart disease of native coronary artery without angina pectoris: I25.10

## 2020-12-11 HISTORY — DX: Unspecified atrial fibrillation: I48.91

## 2020-12-11 HISTORY — DX: Chronic kidney disease, unspecified: N18.9

## 2020-12-11 NOTE — Patient Instructions (Signed)
Your procedure is scheduled on: December 15, 2020 MONDAY Report to the Registration Desk on the 1st floor of the [facility]. To find out your arrival time, please call [phone] between 1PM - 3PM on: December 12, 2020 Friday   REMEMBER: Instructions that are not followed completely may result in serious medical risk, up to and including death; or upon the discretion of your surgeon and anesthesiologist your surgery may need to be rescheduled.  DO NOT EAT OR DRINK after midnight the night before surgery.  No gum chewing, lozengers or hard candies.  TAKE THESE MEDICATIONS THE MORNING OF SURGERY WITH A SIP OF WATER: AMLODIPINE HYDRALAZINE  One week prior to surgery: Stop Anti-inflammatories (NSAIDS) such as Advil, Aleve, Ibuprofen, Motrin, Naproxen, Naprosyn and Aspirin based products such as Excedrin, Goodys Powder, BC Powder. Stop ANY OVER THE COUNTER supplements until after surgery. You may however, continue to take Tylenol if needed for pain up until the day of surgery.  No Alcohol for 24 hours before or after surgery.  No Smoking including e-cigarettes for 24 hours prior to surgery.  No chewable tobacco products for at least 6 hours prior to surgery.  No nicotine patches on the day of surgery.  Do not use any "recreational" drugs for at least a week prior to your surgery.  Please be advised that the combination of cocaine and anesthesia may have negative outcomes, up to and including death. If you test positive for cocaine, your surgery will be cancelled.  On the morning of surgery brush your teeth with toothpaste and water, you may rinse your mouth with mouthwash if you wish. Do not swallow any toothpaste or mouthwash.  Do not wear jewelry, make-up, hairpins, clips or nail polish.  Do not wear lotions, powders, or perfumes OR DEODORANT   Do not shave body from the neck down 48 hours prior to surgery just in case you cut yourself which could leave a site for infection.  Also,  freshly shaved skin may become irritated if using the CHG soap.  Contact lenses, hearing aids and dentures may not be worn into surgery.  Do not bring valuables to the hospital. [facility] is not responsible for any missing/lost belongings or valuables.   Use CHG Soap as directed on instruction sheet.  Notify your doctor if there is any change in your medical condition (cold, fever, infection).  Wear comfortable clothing (specific to your surgery type) to the hospital.  After surgery, you can help prevent lung complications by doing breathing exercises.  Take deep breaths and cough every 1-2 hours. Your doctor may order a device called an Incentive Spirometer to help you take deep breaths. When coughing or sneezing, hold a pillow firmly against your incision with both hands. This is called "splinting." Doing this helps protect your incision. It also decreases belly discomfort.   If you are being discharged the day of surgery, you will not be allowed to drive home. You will need a responsible adult (18 years or older) to drive you home and stay with you that night.   If you are taking public transportation, you will need to have a responsible adult (18 years or older) with you. Please confirm with your physician that it is acceptable to use public transportation.   Please call the [facility] at [phone] if you have any questions about these instructions.  Surgery Visitation Policy:  Patients undergoing a surgery or procedure may have one family member or support person with them  as long as that person is not COVID-19 positive or experiencing its symptoms.  That person may remain in the waiting area during the procedure.

## 2020-12-11 NOTE — H&P (Signed)
  NAME: Mark [name] MRN:   [id number] DOB:   1940-07-20     HISTORY AND PHYSICAL  CHIEF COMPLAINT:  right leg pain  HISTORY:   Mark [name] a 80 y.o. male  with right  Knee Pain Patient presents with a knee injury involving the right knee. Onset of the symptoms was several weeks ago. Inciting event: injured while walking . Current symptoms include giving out. Pain is aggravated by any weight bearing. Patient has had no prior knee problems. Evaluation to date: plain films: no significant change from prior studies. Treatment to date: avoidance of offending activity, brace which is somewhat effective, ice, OTC analgesics which are somewhat effective, and prescription NSAIDS which are somewhat effective.   Plan for right quad repair  PAST MEDICAL HISTORY:   Past Medical History:  Diagnosis Date   Arthritis    Lower Back, Hips, and Hands.   Cancer [facility])    Prostate CA s/o Rad Tx   Hypercholesterolemia    Hypertension    Radiation 09/30/2008-12/02/2010   7800 cGy  Dr.Ottelin    PAST SURGICAL HISTORY:   Past Surgical History:  Procedure Laterality Date   JOINT REPLACEMENT     Arthroscopic surgery on left knee   left knee replacement      MEDICATIONS:  (Not in a hospital admission)   ALLERGIES:  No Known Allergies  REVIEW OF SYSTEMS:   Negative except HPI  FAMILY HISTORY:   Family History  Problem Relation Age of Onset   Hypertension Father    Healthy Sister     SOCIAL HISTORY:   reports that he has never smoked. He has never used smokeless tobacco. He reports that he does not drink alcohol and does not use drugs.  PHYSICAL EXAM:  General appearance: alert, cooperative, and no distress Neck: no JVD, supple, symmetrical, trachea midline, and thyroid not enlarged, symmetric, no tenderness/mass/nodules Resp: clear to auscultation bilaterally Cardio: regular rate and rhythm, S1, S2 normal, no murmur, click, rub or gallop GI: soft, non-tender; bowel sounds normal; no  masses,  no organomegaly Extremities: edema right knee and Homans sign is negative, no sign of DVT Pulses: 2+ and symmetric Skin: Skin color, texture, turgor normal. No rashes or lesions Neurologic: Alert and oriented X 3, normal strength and tone. Normal symmetric reflexes. Normal coordination and gait    LABORATORY STUDIES: No results for input(s): WBC, HGB, HCT, PLT in the last 72 hours.  No results for input(s): NA, K, CL, CO2, GLUCOSE, BUN, CREATININE, CALCIUM in the last 72 hours.  STUDIES/RESULTS:  No results found.  ASSESSMENT:  right quad rupture        Active Problems:   * No active hospital problems. *    PLAN:  Right quad repair   Carlynn Spry 12/11/2020. 3:54 PM

## 2020-12-12 ENCOUNTER — Encounter
Admission: RE | Admit: 2020-12-12 | Discharge: 2020-12-12 | Disposition: A | Discharge status: Home / Self Care | Payer: Medicare HMO | Source: Ambulatory Visit | Attending: Orthopedic Surgery | Admitting: Orthopedic Surgery

## 2020-12-12 ENCOUNTER — Encounter: Payer: Self-pay | Admitting: Urgent Care

## 2020-12-12 DIAGNOSIS — Z01810 Encounter for preprocedural cardiovascular examination: Secondary | ICD-10-CM | POA: Diagnosis not present

## 2020-12-12 DIAGNOSIS — Z01818 Encounter for other preprocedural examination: Secondary | ICD-10-CM | POA: Insufficient documentation

## 2020-12-12 DIAGNOSIS — I10 Essential (primary) hypertension: Secondary | ICD-10-CM | POA: Diagnosis not present

## 2020-12-12 LAB — URINALYSIS, ROUTINE W REFLEX MICROSCOPIC
Bacteria, UA: NONE SEEN
Bilirubin Urine: NEGATIVE
Glucose, UA: NEGATIVE mg/dL
Hgb urine dipstick: NEGATIVE
Ketones, ur: NEGATIVE mg/dL
Leukocytes,Ua: NEGATIVE
Nitrite: NEGATIVE
Protein, ur: 30 mg/dL — AB
Specific Gravity, Urine: 1.015 (ref 1.005–1.030)
pH: 5 (ref 5.0–8.0)

## 2020-12-12 LAB — CBC
HCT: 40 % (ref 39.0–52.0)
Hemoglobin: 12.8 g/dL — ABNORMAL LOW (ref 13.0–17.0)
MCH: 29.6 pg (ref 26.0–34.0)
MCHC: 32 g/dL (ref 30.0–36.0)
MCV: 92.4 fL (ref 80.0–100.0)
Platelets: 231 10*3/uL (ref 150–400)
RBC: 4.33 MIL/uL (ref 4.22–5.81)
RDW: 12.5 % (ref 11.5–15.5)
WBC: 5.1 10*3/uL (ref 4.0–10.5)
nRBC: 0 % (ref 0.0–0.2)

## 2020-12-12 LAB — PROTIME-INR
INR: 1 (ref 0.8–1.2)
Prothrombin Time: 13.1 seconds (ref 11.4–15.2)

## 2020-12-12 LAB — BASIC METABOLIC PANEL
Anion gap: 6 (ref 5–15)
BUN: 15 mg/dL (ref 8–23)
CO2: 27 mmol/L (ref 22–32)
Calcium: 9.1 mg/dL (ref 8.9–10.3)
Chloride: 107 mmol/L (ref 98–111)
Creatinine, Ser: 1.39 mg/dL — ABNORMAL HIGH (ref 0.61–1.24)
GFR, Estimated: 52 mL/min — ABNORMAL LOW (ref >60)
Glucose, Bld: 108 mg/dL — ABNORMAL HIGH (ref 70–99)
Potassium: 3.7 mmol/L (ref 3.5–5.1)
Sodium: 140 mmol/L (ref 135–145)

## 2020-12-12 LAB — APTT: aPTT: 33 seconds (ref 24–36)

## 2020-12-15 ENCOUNTER — Ambulatory Visit
Admission: RE | Admit: 2020-12-15 | Discharge: 2020-12-15 | Disposition: A | Discharge status: Home / Self Care | Payer: No Typology Code available for payment source | Attending: Orthopedic Surgery | Admitting: Orthopedic Surgery

## 2020-12-15 ENCOUNTER — Ambulatory Visit: Payer: No Typology Code available for payment source | Admitting: Anesthesiology

## 2020-12-15 ENCOUNTER — Encounter
Admission: RE | Disposition: A | Discharge status: Home / Self Care | Payer: Self-pay | Source: Home / Self Care | Attending: Orthopedic Surgery

## 2020-12-15 ENCOUNTER — Encounter: Payer: Self-pay | Admitting: Orthopedic Surgery

## 2020-12-15 ENCOUNTER — Ambulatory Visit: Payer: No Typology Code available for payment source

## 2020-12-15 DIAGNOSIS — X58XXXA Exposure to other specified factors, initial encounter: Secondary | ICD-10-CM | POA: Insufficient documentation

## 2020-12-15 DIAGNOSIS — S76191A Other specified injury of right quadriceps muscle, fascia and tendon, initial encounter: Secondary | ICD-10-CM | POA: Diagnosis not present

## 2020-12-15 DIAGNOSIS — Z79899 Other long term (current) drug therapy: Secondary | ICD-10-CM | POA: Diagnosis not present

## 2020-12-15 DIAGNOSIS — Z419 Encounter for procedure for purposes other than remedying health state, unspecified: Secondary | ICD-10-CM

## 2020-12-15 DIAGNOSIS — E7800 Pure hypercholesterolemia, unspecified: Secondary | ICD-10-CM | POA: Diagnosis not present

## 2020-12-15 DIAGNOSIS — G8918 Other acute postprocedural pain: Secondary | ICD-10-CM | POA: Diagnosis not present

## 2020-12-15 DIAGNOSIS — M79604 Pain in right leg: Secondary | ICD-10-CM | POA: Diagnosis not present

## 2020-12-15 HISTORY — PX: QUADRICEPS TENDON REPAIR: SHX756

## 2020-12-15 SURGERY — REPAIR, TENDON, QUADRICEPS
Anesthesia: Spinal | Laterality: Right

## 2020-12-15 MED ORDER — FAMOTIDINE 20 MG PO TABS
ORAL_TABLET | ORAL | Status: AC
  Filled 2020-12-15: qty 1

## 2020-12-15 MED ORDER — FENTANYL CITRATE (PF) 100 MCG/2ML IJ SOLN
25.0000 ug | INTRAMUSCULAR | Status: DC | PRN
  Administered 2020-12-15 (×2): 50 ug via INTRAVENOUS

## 2020-12-15 MED ORDER — ROCURONIUM BROMIDE 100 MG/10ML IV SOLN
INTRAVENOUS | Status: DC | PRN
  Administered 2020-12-15: 50 mg via INTRAVENOUS

## 2020-12-15 MED ORDER — ONDANSETRON HCL 4 MG/2ML IJ SOLN
INTRAMUSCULAR | Status: AC
  Filled 2020-12-15: qty 2

## 2020-12-15 MED ORDER — PROPOFOL 10 MG/ML IV BOLUS
INTRAVENOUS | Status: DC | PRN
  Administered 2020-12-15: 150 mg via INTRAVENOUS
  Administered 2020-12-15 (×2): 50 mg via INTRAVENOUS

## 2020-12-15 MED ORDER — ONDANSETRON HCL 4 MG/2ML IJ SOLN
INTRAMUSCULAR | Status: DC | PRN
Start: 2020-12-15 — End: 2020-12-15
  Administered 2020-12-15: 4 mg via INTRAVENOUS

## 2020-12-15 MED ORDER — ACETAMINOPHEN 10 MG/ML IV SOLN
INTRAVENOUS | Status: AC
  Filled 2020-12-15: qty 100

## 2020-12-15 MED ORDER — FENTANYL CITRATE (PF) 100 MCG/2ML IJ SOLN
INTRAMUSCULAR | Status: AC
  Filled 2020-12-15: qty 2

## 2020-12-15 MED ORDER — GLYCOPYRROLATE 0.2 MG/ML IJ SOLN
INTRAMUSCULAR | Status: AC
  Filled 2020-12-15: qty 1

## 2020-12-15 MED ORDER — HYDRALAZINE HCL 20 MG/ML IJ SOLN
INTRAMUSCULAR | Status: DC | PRN
  Administered 2020-12-15: 10 mg via INTRAVENOUS

## 2020-12-15 MED ORDER — ACETAMINOPHEN 325 MG PO TABS: 325.0000 mg | ORAL_TABLET | Freq: Four times a day (QID) | ORAL | Status: DC | PRN

## 2020-12-15 MED ORDER — FENTANYL CITRATE (PF) 100 MCG/2ML IJ SOLN
INTRAMUSCULAR | Status: DC | PRN
  Administered 2020-12-15: 50 ug via INTRAVENOUS
  Administered 2020-12-15 (×2): 25 ug via INTRAVENOUS
  Administered 2020-12-15: 50 ug via INTRAVENOUS

## 2020-12-15 MED ORDER — FENTANYL CITRATE PF 50 MCG/ML IJ SOSY: 50.0000 ug | PREFILLED_SYRINGE | Freq: Once | INTRAMUSCULAR | Status: AC

## 2020-12-15 MED ORDER — EPHEDRINE 5 MG/ML INJ
INTRAVENOUS | Status: AC
  Filled 2020-12-15: qty 5

## 2020-12-15 MED ORDER — ACETAMINOPHEN 10 MG/ML IV SOLN
INTRAVENOUS | Status: DC | PRN
  Administered 2020-12-15: 1000 mg via INTRAVENOUS

## 2020-12-15 MED ORDER — DOCUSATE SODIUM 100 MG PO CAPS: 100.0000 mg | ORAL_CAPSULE | Freq: Every day | ORAL | 2 refills | Status: AC | PRN

## 2020-12-15 MED ORDER — SODIUM CHLORIDE 0.9 % IR SOLN
Status: DC | PRN
  Administered 2020-12-15: 1000 mL

## 2020-12-15 MED ORDER — LACTATED RINGERS IV SOLN: INTRAVENOUS | Status: DC

## 2020-12-15 MED ORDER — BUPIVACAINE HCL (PF) 0.5 % IJ SOLN
INTRAMUSCULAR | Status: AC
  Filled 2020-12-15: qty 10

## 2020-12-15 MED ORDER — MIDAZOLAM HCL 2 MG/2ML IJ SOLN
INTRAMUSCULAR | Status: AC
  Administered 2020-12-15: 1 mg via INTRAVENOUS
  Filled 2020-12-15: qty 2

## 2020-12-15 MED ORDER — MORPHINE SULFATE (PF) 2 MG/ML IV SOLN: 0.5000 mg | INTRAVENOUS | Status: DC | PRN

## 2020-12-15 MED ORDER — HYDROCODONE-ACETAMINOPHEN 5-325 MG PO TABS
1.0000 | ORAL_TABLET | Freq: Once | ORAL | Status: AC
  Administered 2020-12-15: 1 via ORAL

## 2020-12-15 MED ORDER — CEFAZOLIN SODIUM-DEXTROSE 2-4 GM/100ML-% IV SOLN
INTRAVENOUS | Status: AC
  Filled 2020-12-15: qty 100

## 2020-12-15 MED ORDER — CHLORHEXIDINE GLUCONATE 0.12 % MT SOLN
OROMUCOSAL | Status: AC
  Filled 2020-12-15: qty 15

## 2020-12-15 MED ORDER — HYDROCODONE-ACETAMINOPHEN 7.5-325 MG PO TABS: 1.0000 | ORAL_TABLET | ORAL | Status: DC | PRN

## 2020-12-15 MED ORDER — HYDROMORPHONE HCL 1 MG/ML IJ SOLN
INTRAMUSCULAR | Status: DC | PRN
  Administered 2020-12-15 (×2): .5 mg via INTRAVENOUS

## 2020-12-15 MED ORDER — FENTANYL CITRATE (PF) 100 MCG/2ML IJ SOLN
INTRAMUSCULAR | Status: AC
  Administered 2020-12-15: 50 ug via INTRAVENOUS
  Filled 2020-12-15: qty 2

## 2020-12-15 MED ORDER — PROPOFOL 1000 MG/100ML IV EMUL
INTRAVENOUS | Status: AC
  Filled 2020-12-15: qty 100

## 2020-12-15 MED ORDER — DEXAMETHASONE SODIUM PHOSPHATE 10 MG/ML IJ SOLN
INTRAMUSCULAR | Status: AC
  Filled 2020-12-15: qty 1

## 2020-12-15 MED ORDER — ACETAMINOPHEN 500 MG PO TABS: 500.0000 mg | ORAL_TABLET | Freq: Four times a day (QID) | ORAL | Status: DC

## 2020-12-15 MED ORDER — HYDROMORPHONE HCL 1 MG/ML IJ SOLN
INTRAMUSCULAR | Status: AC
  Filled 2020-12-15: qty 1

## 2020-12-15 MED ORDER — FENTANYL CITRATE PF 50 MCG/ML IJ SOSY
PREFILLED_SYRINGE | INTRAMUSCULAR | Status: AC
  Administered 2020-12-15: 50 ug via INTRAVENOUS
  Filled 2020-12-15: qty 1

## 2020-12-15 MED ORDER — NEOMYCIN-POLYMYXIN B GU 40-200000 IR SOLN
Status: AC
  Filled 2020-12-15: qty 20

## 2020-12-15 MED ORDER — METOCLOPRAMIDE HCL 5 MG/ML IJ SOLN: 5.0000 mg | Freq: Three times a day (TID) | INTRAMUSCULAR | Status: DC | PRN

## 2020-12-15 MED ORDER — BUPIVACAINE HCL (PF) 0.25 % IJ SOLN
INTRAMUSCULAR | Status: DC | PRN
  Administered 2020-12-15: 20 mL

## 2020-12-15 MED ORDER — ORAL CARE MOUTH RINSE: 15.0000 mL | Freq: Once | OROMUCOSAL | Status: AC

## 2020-12-15 MED ORDER — HYDROCODONE-ACETAMINOPHEN 5-325 MG PO TABS
ORAL_TABLET | ORAL | Status: AC
  Filled 2020-12-15: qty 1

## 2020-12-15 MED ORDER — METOCLOPRAMIDE HCL 10 MG PO TABS: 5.0000 mg | ORAL_TABLET | Freq: Three times a day (TID) | ORAL | Status: DC | PRN

## 2020-12-15 MED ORDER — HYDROCODONE-ACETAMINOPHEN 5-325 MG PO TABS: 1.0000 | ORAL_TABLET | ORAL | 0 refills | Status: DC | PRN

## 2020-12-15 MED ORDER — ONDANSETRON HCL 4 MG/2ML IJ SOLN: 4.0000 mg | Freq: Four times a day (QID) | INTRAMUSCULAR | Status: DC | PRN

## 2020-12-15 MED ORDER — ONDANSETRON HCL 4 MG PO TABS: 4.0000 mg | ORAL_TABLET | Freq: Four times a day (QID) | ORAL | Status: DC | PRN

## 2020-12-15 MED ORDER — POVIDONE-IODINE 10 % EX SWAB: 2.0000 "application " | Freq: Once | CUTANEOUS | Status: DC

## 2020-12-15 MED ORDER — ROCURONIUM BROMIDE 10 MG/ML (PF) SYRINGE
PREFILLED_SYRINGE | INTRAVENOUS | Status: AC
  Filled 2020-12-15: qty 10

## 2020-12-15 MED ORDER — HYDROMORPHONE HCL 1 MG/ML IJ SOLN
0.2500 mg | INTRAMUSCULAR | Status: DC | PRN
Start: 2020-12-15 — End: 2020-12-16
  Administered 2020-12-15: 0.5 mg via INTRAVENOUS

## 2020-12-15 MED ORDER — SUCCINYLCHOLINE CHLORIDE 200 MG/10ML IV SOSY
PREFILLED_SYRINGE | INTRAVENOUS | Status: AC
  Filled 2020-12-15: qty 10

## 2020-12-15 MED ORDER — LIDOCAINE HCL (PF) 2 % IJ SOLN
INTRAMUSCULAR | Status: AC
  Filled 2020-12-15: qty 5

## 2020-12-15 MED ORDER — OXYCODONE HCL 5 MG PO TABS
ORAL_TABLET | ORAL | Status: AC
  Filled 2020-12-15: qty 1

## 2020-12-15 MED ORDER — CEFAZOLIN SODIUM-DEXTROSE 2-4 GM/100ML-% IV SOLN
2.0000 g | INTRAVENOUS | Status: AC
  Administered 2020-12-15: 2 g via INTRAVENOUS

## 2020-12-15 MED ORDER — LIDOCAINE HCL (CARDIAC) PF 100 MG/5ML IV SOSY
PREFILLED_SYRINGE | INTRAVENOUS | Status: DC | PRN
  Administered 2020-12-15: 100 mg via INTRAVENOUS

## 2020-12-15 MED ORDER — KETOROLAC TROMETHAMINE 15 MG/ML IJ SOLN
INTRAMUSCULAR | Status: AC
  Administered 2020-12-15: 7.5 mg via INTRAVENOUS
  Filled 2020-12-15: qty 1

## 2020-12-15 MED ORDER — DEXAMETHASONE SODIUM PHOSPHATE 10 MG/ML IJ SOLN
INTRAMUSCULAR | Status: DC | PRN
  Administered 2020-12-15: 8 mg via INTRAVENOUS

## 2020-12-15 MED ORDER — SODIUM CHLORIDE FLUSH 0.9 % IV SOLN
INTRAVENOUS | Status: AC
  Filled 2020-12-15: qty 10

## 2020-12-15 MED ORDER — LIDOCAINE HCL (PF) 1 % IJ SOLN
INTRAMUSCULAR | Status: DC | PRN
  Administered 2020-12-15: 1 mL via SUBCUTANEOUS

## 2020-12-15 MED ORDER — CHLORHEXIDINE GLUCONATE 0.12 % MT SOLN
15.0000 mL | Freq: Once | OROMUCOSAL | Status: AC
  Administered 2020-12-15: 15 mL via OROMUCOSAL

## 2020-12-15 MED ORDER — HYDROMORPHONE HCL 1 MG/ML IJ SOLN
INTRAMUSCULAR | Status: AC
  Administered 2020-12-15: 0.5 mg via INTRAVENOUS
  Filled 2020-12-15: qty 1

## 2020-12-15 MED ORDER — HYDROCODONE-ACETAMINOPHEN 5-325 MG PO TABS: 1.0000 | ORAL_TABLET | ORAL | Status: DC | PRN

## 2020-12-15 MED ORDER — SUGAMMADEX SODIUM 200 MG/2ML IV SOLN
INTRAVENOUS | Status: DC | PRN
  Administered 2020-12-15: 187.8 mg via INTRAVENOUS

## 2020-12-15 MED ORDER — OXYCODONE HCL 5 MG PO TABS
5.0000 mg | ORAL_TABLET | Freq: Once | ORAL | Status: AC | PRN
  Administered 2020-12-15: 5 mg via ORAL

## 2020-12-15 MED ORDER — KETOROLAC TROMETHAMINE 15 MG/ML IJ SOLN: 7.5000 mg | Freq: Four times a day (QID) | INTRAMUSCULAR | Status: DC

## 2020-12-15 MED ORDER — OXYCODONE HCL 5 MG/5ML PO SOLN
5.0000 mg | Freq: Once | ORAL | Status: AC | PRN
Start: 2020-12-15 — End: 2020-12-15

## 2020-12-15 MED ORDER — MIDAZOLAM HCL 2 MG/2ML IJ SOLN: 1.0000 mg | Freq: Once | INTRAMUSCULAR | Status: AC

## 2020-12-15 MED ORDER — PROPOFOL 10 MG/ML IV BOLUS
INTRAVENOUS | Status: AC
  Filled 2020-12-15: qty 20

## 2020-12-15 MED ORDER — LIDOCAINE HCL (PF) 1 % IJ SOLN
INTRAMUSCULAR | Status: AC
  Filled 2020-12-15: qty 5

## 2020-12-15 MED ORDER — FAMOTIDINE 20 MG PO TABS
20.0000 mg | ORAL_TABLET | Freq: Once | ORAL | Status: AC
  Administered 2020-12-15: 20 mg via ORAL

## 2020-12-15 SURGICAL SUPPLY — 53 items
APL PRP STRL LF DISP 70% ISPRP (MISCELLANEOUS) ×1
BLADE SURG SZ10 CARB STEEL (BLADE) ×2 IMPLANT
BNDG COHESIVE 6X5 TAN ST LF (GAUZE/BANDAGES/DRESSINGS) ×2 IMPLANT
BNDG ELASTIC 6X5.8 VLCR STR LF (GAUZE/BANDAGES/DRESSINGS) ×2 IMPLANT
BNDG ESMARK 6X12 TAN STRL LF (GAUZE/BANDAGES/DRESSINGS) ×2 IMPLANT
CANISTER SUCT 1200ML W/VALVE (MISCELLANEOUS) IMPLANT
CHLORAPREP W/TINT 26 (MISCELLANEOUS) ×2 IMPLANT
COOLER POLAR GLACIER W/PUMP (MISCELLANEOUS) ×2 IMPLANT
CUFF TOURN SGL QUICK 24 (TOURNIQUET CUFF) ×2
CUFF TOURN SGL QUICK 34 (TOURNIQUET CUFF)
CUFF TRNQT CYL 24X4X16.5-23 (TOURNIQUET CUFF) ×1 IMPLANT
CUFF TRNQT CYL 34X4.125X (TOURNIQUET CUFF) IMPLANT
DRAPE U-SHAPE 47X51 STRL (DRAPES) ×2 IMPLANT
DRSG OPSITE POSTOP 4X12 (GAUZE/BANDAGES/DRESSINGS) IMPLANT
ELECT REM PT RETURN 9FT ADLT (ELECTROSURGICAL) ×2
ELECTRODE REM PT RTRN 9FT ADLT (ELECTROSURGICAL) ×1 IMPLANT
GAUZE 4X4 16PLY ~~LOC~~+RFID DBL (SPONGE) ×2 IMPLANT
GAUZE SPONGE 4X4 12PLY STRL (GAUZE/BANDAGES/DRESSINGS) ×2 IMPLANT
GAUZE XEROFORM 1X8 LF (GAUZE/BANDAGES/DRESSINGS) ×2 IMPLANT
GLOVE SURG ORTHO LTX SZ8 (GLOVE) ×2 IMPLANT
GLOVE SURG UNDER LTX SZ8 (GLOVE) ×2 IMPLANT
GOWN STRL REUS W/ TWL LRG LVL3 (GOWN DISPOSABLE) ×1 IMPLANT
GOWN STRL REUS W/ TWL XL LVL3 (GOWN DISPOSABLE) ×1 IMPLANT
GOWN STRL REUS W/TWL LRG LVL3 (GOWN DISPOSABLE) ×2
GOWN STRL REUS W/TWL XL LVL3 (GOWN DISPOSABLE) ×2
HANDLE YANKAUER SUCT BULB TIP (MISCELLANEOUS) ×2 IMPLANT
KIT TURNOVER KIT A (KITS) ×2 IMPLANT
MANIFOLD NEPTUNE II (INSTRUMENTS) ×2 IMPLANT
NDL SAFETY ECLIPSE 18X1.5 (NEEDLE) ×1 IMPLANT
NEEDLE HYPO 18GX1.5 SHARP (NEEDLE) ×2
NEEDLE HYPO 22GX1.5 SAFETY (NEEDLE) ×2 IMPLANT
NEEDLE MAYO CATGUT SZ4 (NEEDLE) IMPLANT
PACK EXTREMITY ARMC (MISCELLANEOUS) ×2 IMPLANT
PAD ABD DERMACEA PRESS 5X9 (GAUZE/BANDAGES/DRESSINGS) ×2 IMPLANT
PAD ARMBOARD 7.5X6 YLW CONV (MISCELLANEOUS) ×2 IMPLANT
PAD WRAPON POLAR KNEE (MISCELLANEOUS) ×1 IMPLANT
PADDING CAST 4IN STRL (MISCELLANEOUS) ×2
PADDING CAST BLEND 4X4 STRL (MISCELLANEOUS) ×2 IMPLANT
RETRIEVER SUT HEWSON (MISCELLANEOUS) ×2 IMPLANT
SPONGE T-LAP 18X18 ~~LOC~~+RFID (SPONGE) ×4 IMPLANT
STAPLER SKIN PROX 35W (STAPLE) ×2 IMPLANT
STOCKINETTE BIAS CUT 6 980064 (GAUZE/BANDAGES/DRESSINGS) IMPLANT
STOCKINETTE M/LG 89821 (MISCELLANEOUS) ×2 IMPLANT
SUCTION FRAZIER HANDLE 10FR (MISCELLANEOUS)
SUCTION TUBE FRAZIER 10FR DISP (MISCELLANEOUS) IMPLANT
SUT FIBERWIRE #5 38 BLUE (WIRE) IMPLANT
SUT FIBERWIRE #5 38 CONV BLUE (SUTURE) ×4
SUT VIC AB 2-0 CT1 18 (SUTURE) ×2 IMPLANT
SUT VIC AB PLUS 45CM 1-MO-4 (SUTURE) ×4 IMPLANT
SUTURE FIBERWR #5 38 CONV BLUE (SUTURE) ×2 IMPLANT
SYR 20ML LL LF (SYRINGE) ×2 IMPLANT
WATER STERILE IRR 500ML POUR (IV SOLUTION) ×2 IMPLANT
WRAPON POLAR PAD KNEE (MISCELLANEOUS) ×2

## 2020-12-15 NOTE — Anesthesia Procedure Notes (Signed)
Anesthesia Regional Block: Adductor canal block   Pre-Anesthetic Checklist: , timeout performed,  Correct Patient, Correct Site, Correct Laterality,  Correct Procedure, Correct Position, site marked,  Risks and benefits discussed,  Surgical consent,  Pre-op evaluation,  At surgeon's request and post-op pain management  Laterality: Lower and Right  Prep: chloraprep       Needles:  Injection technique: Single-shot  Needle Type: Echogenic Needle     Needle Length: 9cm  Needle Gauge: 21     Additional Needles:   Procedures:,,,, ultrasound used (permanent image in chart),,    Narrative:  Start time: 12/15/2020 8:35 AM End time: 12/15/2020 8:39 AM Injection made incrementally with aspirations every 5 mL.  Performed by: Personally  Anesthesiologist: Felicia Bloomquist, Precious Haws, MD  Additional Notes: Patient consented for risk and benefits of nerve block including but not limited to nerve damage, failed block, bleeding and infection.  Patient voiced understanding.  Functioning IV was confirmed and monitors were applied.  Timeout done prior to procedure and prior to any sedation being given to the patient.  Patient confirmed procedure site prior to any sedation given to the patient.  A 30m 22ga Stimuplex needle was used. Sterile prep,hand hygiene and sterile gloves were used.  Minimal sedation used for procedure.  No paresthesia endorsed by patient during the procedure.  Negative aspiration and negative test dose prior to incremental administration of local anesthetic. The patient tolerated the procedure well with no immediate complications.

## 2020-12-15 NOTE — Anesthesia Preprocedure Evaluation (Addendum)
Anesthesia Evaluation  Patient identified by MRN, date of birth, ID band Patient awake    Reviewed: Allergy & Precautions, NPO status , Patient's Chart, lab work & pertinent test results  History of Anesthesia Complications Negative for: history of anesthetic complications  Airway Mallampati: III  TM Distance: >3 FB Neck ROM: full    Dental  (+) Chipped   Pulmonary neg pulmonary ROS, neg shortness of breath,    Pulmonary exam normal        Cardiovascular Exercise Tolerance: Good hypertension, (-) angina+ CAD  Normal cardiovascular exam     Neuro/Psych negative neurological ROS  negative psych ROS   GI/Hepatic negative GI ROS, Neg liver ROS,   Endo/Other  negative endocrine ROS  Renal/GU Renal disease     Musculoskeletal  (+) Arthritis ,   Abdominal   Peds  Hematology negative hematology ROS (+)   Anesthesia Other Findings Past Medical History: No date: Arthritis     Comment:  Lower Back, Hips, and Hands. No date: Cancer [facility])     Comment:  Prostate CA s/o Rad Tx No date: Chronic kidney disease No date: Coronary artery disease No date: Hypercholesterolemia No date: Hypertension 09/30/2008-12/02/2010: Radiation     Comment:  7800 cGy  Dr.Ottelin  Past Surgical History: No date: CATARACT SURGERY No date: KNEE ARTHROSCOPY; Left  BMI    Body Mass Index: 33.41 kg/m      Reproductive/Obstetrics negative OB ROS                             Anesthesia Physical Anesthesia Plan  ASA: 3  Anesthesia Plan: General ETT   Post-op Pain Management:  Regional for Post-op pain   Induction: Intravenous  PONV Risk Score and Plan: Ondansetron, Midazolam and Treatment may vary due to age or medical condition  Airway Management Planned: Oral ETT  Additional Equipment:   Intra-op Plan:   Post-operative Plan: Extubation in OR  Informed Consent: I have reviewed the patients History and  Physical, chart, labs and discussed the procedure including the risks, benefits and alternatives for the proposed anesthesia with the patient or authorized representative who has indicated his/her understanding and acceptance.     Dental Advisory Given  Plan Discussed with: Anesthesiologist, CRNA and Surgeon  Anesthesia Plan Comments: (Patient consented for risks of anesthesia including but not limited to:  - adverse reactions to medications - damage to eyes, teeth, lips or other oral mucosa - nerve damage due to positioning  - sore throat or hoarseness - Damage to heart, brain, nerves, lungs, other parts of body or loss of life  Patient voiced understanding.)       Anesthesia Quick Evaluation

## 2020-12-15 NOTE — Progress Notes (Signed)
NOTIFIED DR. Harlow Mares THROUGH SECURE CHAT THAT PATIENT WIFE IS REQUESTING AN UPDATE FROM MD. DR Harlow Mares IS TO CALL HER TOMORROW.

## 2020-12-15 NOTE — Op Note (Addendum)
   12/15/2020  1:46 PM  PATIENT:  Mark [name]    PRE-OPERATIVE DIAGNOSIS:  [id number] Inj right quadriceps muscle, fascia and tendon, init encntr  POST-OPERATIVE DIAGNOSIS:  Same  PROCEDURE:  REPAIR QUADRICEP TENDON, RIGHT  SURGEON:  Lovell Sheehan, MD  ASSIST: Carlynn Spry, PA-C  ANESTHESIA:   General  PREOPERATIVE INDICATIONS:  Mark [name] is a  80 y.o. male with a diagnosis of S76.191A Inj right quadriceps muscle, fascia and tendon, init encntr who elected for surgical management.    The risks benefits and alternatives were discussed with the patient preoperatively including but not limited to the risks of infection, bleeding, nerve injury, cardiopulmonary complications, the need for revision surgery, among others, and the patient was willing to proceed.  EBL: 50 ml  TOURNIQUET TIME: 41  MIN at 250 mmHg  OPERATIVE IMPLANTS: number 5 FiberWire x 2  OPERATIVE FINDINGS: Complete disruption of the quadriceps tendon with 2 cm of displacement. Tear of both the medial and lateral retinaculum  OPERATIVE PROCEDURE: The patient was brought to the operating room and underwent general endotracheal anesthesia in the supine position.  The operative leg was prepped and draped in a sterile fashion.  Esmarch was applied and tourniquet inflated to 250 mmHg. An anterior midline incision was made and dissection carried out sharply through subcutaneous tissue. The tendon ends were freshened up.  The superior aspect of patella was exposed and a rongeur used to freshen up the bone.  Drill holes were then made through the patella proximal to distal. The tendon was repaired with 4 strands of #5 FiberWire sutures through 3 drill holes.  Additional repair of the retinaculum and capsule was performed with #1 Vicryl interrupted stitches. Subcutaneous tissue was closed with 2-0 Vicryl and the skin was closed with staples.  Sponge and needle counts were correct. Dry sterile dressing was applied along  with Polar Care and knee brace locked in extension  Tourniquet was deflated with good return of blood flow to the foot.  The patient was awakened and taken to recovery in good condition.  Kurtis Bushman, MD

## 2020-12-15 NOTE — Discharge Instructions (Addendum)
Continue weight bear as tolerated on the right lower extremity while in knee immobilizer only  Elevate the right lower extremity whenever possible and continue the polar care while elevating the extremity. Keep dressing clean and dry.  Call [phone] with any questions, such as fever > 101.5 degrees, drainage from the wound or shortness of breath. AMBULATORY SURGERY  DISCHARGE INSTRUCTIONS   The drugs that you were given will stay in your system until tomorrow so for the next 24 hours you should not:  Drive an automobile Make any legal decisions Drink any alcoholic beverage   You may resume regular meals tomorrow.  Today it is better to start with liquids and gradually work up to solid foods.  You may eat anything you prefer, but it is better to start with liquids, then soup and crackers, and gradually work up to solid foods.   Please notify your doctor immediately if you have any unusual bleeding, trouble breathing, redness and pain at the surgery site, drainage, fever, or pain not relieved by medication.     Your post-operative visit with Dr.                                       is: Date:                        Time:    Please call to schedule your post-operative visit.  Additional Instructions:

## 2020-12-15 NOTE — Anesthesia Procedure Notes (Signed)
Procedure Name: Intubation Date/Time: 12/15/2020 12:36 PM Performed by: Demetrius Charity, CRNA Pre-anesthesia Checklist: Patient identified, Patient being monitored, Timeout performed, Emergency Drugs available and Suction available Patient Re-evaluated:Patient Re-evaluated prior to induction Oxygen Delivery Method: Circle system utilized Preoxygenation: Pre-oxygenation with 100% oxygen Induction Type: IV induction Ventilation: Mask ventilation without difficulty and Oral airway inserted - appropriate to patient size Laryngoscope Size: Mac and McGraph Grade View: Grade II Tube type: Oral Tube size: 7.0 mm Number of attempts: 1 Airway Equipment and Method: Stylet and Video-laryngoscopy Placement Confirmation: ETT inserted through vocal cords under direct vision, positive ETCO2 and breath sounds checked- equal and bilateral Secured at: 21 cm Tube secured with: Tape Dental Injury: Teeth and Oropharynx as per pre-operative assessment

## 2020-12-15 NOTE — Anesthesia Postprocedure Evaluation (Signed)
Anesthesia Post Note  Patient: Mark [name]  Procedure(s) Performed: REPAIR QUADRICEP TENDON (Right)  Patient location during evaluation: PACU Anesthesia Type: Spinal Level of consciousness: awake and alert Pain management: pain level controlled Vital Signs Assessment: post-procedure vital signs reviewed and stable Respiratory status: spontaneous breathing, nonlabored ventilation, respiratory function stable and patient connected to nasal cannula oxygen Cardiovascular status: blood pressure returned to baseline and stable Postop Assessment: no apparent nausea or vomiting Anesthetic complications: no   No notable events documented.   Last Vitals:  Vitals:   12/15/20 1445 12/15/20 1500  BP: (!) 174/80 (!) 164/74  Pulse: 79 72  Resp: 13 14  Temp:    SpO2: 97% 96%    Last Pain:  Vitals:   12/15/20 1445  TempSrc:   PainSc: 10-Worst pain ever                 Precious Haws Crue Otero

## 2020-12-15 NOTE — H&P (Signed)
The patient has been re-examined, and the chart reviewed, and there have been no interval changes to the documented history and physical.  Plan a right quadriceps tendon repair today. ° °Anesthesia is consulted regarding a peripheral nerve block for post-operative pain. ° °The risks, benefits, and alternatives have been discussed at length, and the patient is willing to proceed.   ° °

## 2020-12-15 NOTE — Transfer of Care (Signed)
Immediate Anesthesia Transfer of Care Note  Patient: Mark [name]  Procedure(s) Performed: REPAIR QUADRICEP TENDON (Right)  Patient Location: PACU  Anesthesia Type:General  Level of Consciousness: awake and alert   Airway & Oxygen Therapy: Patient Spontanous Breathing and Patient connected to face mask oxygen  Post-op Assessment: Report given to RN and Post -op Vital signs reviewed and stable  Post vital signs: Reviewed and stable  Last Vitals:  Vitals Value Taken Time  BP 162/86   Temp    Pulse 71   Resp 17   SpO2 100     Last Pain:  Vitals:   12/15/20 0807  TempSrc: Temporal  PainSc: 0-No pain         Complications: No notable events documented.

## 2020-12-16 ENCOUNTER — Encounter: Payer: Self-pay | Admitting: Orthopedic Surgery

## 2020-12-24 ENCOUNTER — Encounter: Payer: Self-pay | Admitting: Orthopedic Surgery

## 2020-12-30 DIAGNOSIS — E7800 Pure hypercholesterolemia, unspecified: Secondary | ICD-10-CM | POA: Diagnosis not present

## 2020-12-30 DIAGNOSIS — Z0000 Encounter for general adult medical examination without abnormal findings: Secondary | ICD-10-CM | POA: Diagnosis not present

## 2020-12-30 DIAGNOSIS — Z1389 Encounter for screening for other disorder: Secondary | ICD-10-CM | POA: Diagnosis not present

## 2020-12-30 DIAGNOSIS — C61 Malignant neoplasm of prostate: Secondary | ICD-10-CM | POA: Diagnosis not present

## 2020-12-30 DIAGNOSIS — I10 Essential (primary) hypertension: Secondary | ICD-10-CM | POA: Diagnosis not present

## 2021-01-12 DIAGNOSIS — R9721 Rising PSA following treatment for malignant neoplasm of prostate: Secondary | ICD-10-CM | POA: Diagnosis not present

## 2021-01-12 DIAGNOSIS — C61 Malignant neoplasm of prostate: Secondary | ICD-10-CM | POA: Diagnosis not present

## 2021-01-21 DIAGNOSIS — C61 Malignant neoplasm of prostate: Secondary | ICD-10-CM | POA: Diagnosis not present

## 2021-01-21 DIAGNOSIS — Z5111 Encounter for antineoplastic chemotherapy: Secondary | ICD-10-CM | POA: Diagnosis not present

## 2021-02-25 DIAGNOSIS — Z5111 Encounter for antineoplastic chemotherapy: Secondary | ICD-10-CM | POA: Diagnosis not present

## 2021-02-25 DIAGNOSIS — C61 Malignant neoplasm of prostate: Secondary | ICD-10-CM | POA: Diagnosis not present

## 2021-03-09 DIAGNOSIS — Z20822 Contact with and (suspected) exposure to covid-19: Secondary | ICD-10-CM | POA: Diagnosis not present

## 2021-03-31 DIAGNOSIS — M1612 Unilateral primary osteoarthritis, left hip: Secondary | ICD-10-CM | POA: Diagnosis not present

## 2021-05-11 DIAGNOSIS — L309 Dermatitis, unspecified: Secondary | ICD-10-CM | POA: Diagnosis not present

## 2021-05-11 DIAGNOSIS — R42 Dizziness and giddiness: Secondary | ICD-10-CM | POA: Diagnosis not present

## 2021-05-11 DIAGNOSIS — C61 Malignant neoplasm of prostate: Secondary | ICD-10-CM | POA: Diagnosis not present

## 2021-05-11 DIAGNOSIS — I10 Essential (primary) hypertension: Secondary | ICD-10-CM | POA: Diagnosis not present

## 2021-06-17 DIAGNOSIS — M1612 Unilateral primary osteoarthritis, left hip: Secondary | ICD-10-CM | POA: Diagnosis not present

## 2021-06-29 DIAGNOSIS — E7800 Pure hypercholesterolemia, unspecified: Secondary | ICD-10-CM | POA: Diagnosis not present

## 2021-06-29 DIAGNOSIS — R42 Dizziness and giddiness: Secondary | ICD-10-CM | POA: Diagnosis not present

## 2021-06-29 DIAGNOSIS — I10 Essential (primary) hypertension: Secondary | ICD-10-CM | POA: Diagnosis not present

## 2021-06-29 DIAGNOSIS — R609 Edema, unspecified: Secondary | ICD-10-CM | POA: Diagnosis not present

## 2021-06-29 DIAGNOSIS — I700 Atherosclerosis of aorta: Secondary | ICD-10-CM | POA: Diagnosis not present

## 2021-07-06 DIAGNOSIS — C61 Malignant neoplasm of prostate: Secondary | ICD-10-CM | POA: Diagnosis not present

## 2021-07-07 DIAGNOSIS — M25652 Stiffness of left hip, not elsewhere classified: Secondary | ICD-10-CM | POA: Diagnosis not present

## 2021-07-07 DIAGNOSIS — M25552 Pain in left hip: Secondary | ICD-10-CM | POA: Diagnosis not present

## 2021-07-14 DIAGNOSIS — Z8546 Personal history of malignant neoplasm of prostate: Secondary | ICD-10-CM | POA: Diagnosis not present

## 2021-07-14 DIAGNOSIS — E785 Hyperlipidemia, unspecified: Secondary | ICD-10-CM | POA: Diagnosis not present

## 2021-07-14 DIAGNOSIS — I10 Essential (primary) hypertension: Secondary | ICD-10-CM | POA: Diagnosis not present

## 2021-07-14 DIAGNOSIS — K5900 Constipation, unspecified: Secondary | ICD-10-CM | POA: Diagnosis not present

## 2021-07-14 DIAGNOSIS — Z8249 Family history of ischemic heart disease and other diseases of the circulatory system: Secondary | ICD-10-CM | POA: Diagnosis not present

## 2021-07-14 DIAGNOSIS — Z809 Family history of malignant neoplasm, unspecified: Secondary | ICD-10-CM | POA: Diagnosis not present

## 2021-07-14 DIAGNOSIS — E669 Obesity, unspecified: Secondary | ICD-10-CM | POA: Diagnosis not present

## 2021-07-14 DIAGNOSIS — Z7982 Long term (current) use of aspirin: Secondary | ICD-10-CM | POA: Diagnosis not present

## 2021-07-14 DIAGNOSIS — Z833 Family history of diabetes mellitus: Secondary | ICD-10-CM | POA: Diagnosis not present

## 2021-07-22 DIAGNOSIS — M25652 Stiffness of left hip, not elsewhere classified: Secondary | ICD-10-CM | POA: Diagnosis not present

## 2021-07-22 DIAGNOSIS — M25552 Pain in left hip: Secondary | ICD-10-CM | POA: Diagnosis not present

## 2021-09-02 DIAGNOSIS — C61 Malignant neoplasm of prostate: Secondary | ICD-10-CM | POA: Diagnosis not present

## 2021-09-11 ENCOUNTER — Encounter (HOSPITAL_COMMUNITY): Payer: Self-pay

## 2021-09-11 ENCOUNTER — Emergency Department (HOSPITAL_COMMUNITY)
Admission: EM | Admit: 2021-09-11 | Discharge: 2021-09-11 | Disposition: A | Discharge status: Home / Self Care | Payer: No Typology Code available for payment source | Attending: Emergency Medicine | Admitting: Emergency Medicine

## 2021-09-11 ENCOUNTER — Emergency Department (HOSPITAL_COMMUNITY): Payer: No Typology Code available for payment source

## 2021-09-11 ENCOUNTER — Other Ambulatory Visit: Payer: Self-pay

## 2021-09-11 DIAGNOSIS — R102 Pelvic and perineal pain: Secondary | ICD-10-CM | POA: Diagnosis present

## 2021-09-11 DIAGNOSIS — M25552 Pain in left hip: Secondary | ICD-10-CM | POA: Insufficient documentation

## 2021-09-11 MED ORDER — PREDNISONE 20 MG PO TABS: 20.0000 mg | ORAL_TABLET | Freq: Every day | ORAL | 0 refills | Status: DC

## 2021-09-11 MED ORDER — KETOROLAC TROMETHAMINE 15 MG/ML IJ SOLN
15.0000 mg | Freq: Once | INTRAMUSCULAR | Status: AC
  Administered 2021-09-11: 15 mg via INTRAMUSCULAR
  Filled 2021-09-11: qty 1

## 2021-09-11 MED ORDER — PREDNISONE 20 MG PO TABS
40.0000 mg | ORAL_TABLET | Freq: Once | ORAL | Status: AC
  Administered 2021-09-11: 40 mg via ORAL
  Filled 2021-09-11: qty 2

## 2021-09-11 NOTE — Discharge Instructions (Signed)
The x-ray did show some arthritis in the hip.  I think this is most likely musculoskeletal pain.  Hopefully the steroids will help.  Otherwise continue the medicines were already on.  Follow-up with your doctors.

## 2021-09-11 NOTE — ED Provider Notes (Signed)
[facility] DEPT Provider Note   CSN: [id number] Arrival date & time: 09/11/21  1959     History  Chief Complaint  Patient presents with   Hip Pain    Mark [name] is a 81 y.o. male.   Hip Pain Patient presents with pelvic and hip pain.  Began after almost slipping.  No fall.  Has some chronic issues with both hips.  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  Does go to physical therapy.  Pain was worse today.  No new numbness or weakness.  Not on blood thinners.  No loss of bladder bowel control.     Home Medications Prior to Admission medications   Medication Sig Start Date End Date Taking? Authorizing Provider  predniSONE (DELTASONE) 20 MG tablet Take 1 tablet (20 mg total) by mouth daily. 09/11/21  Yes Davonna Belling, MD  amLODipine (NORVASC) 10 MG tablet Take 1 tablet (10 mg total) by mouth daily. 05/04/16   Roselee Nova, MD  docusate sodium (COLACE) 100 MG capsule Take 1 capsule (100 mg total) by mouth daily as needed. 12/15/20 12/15/21  Lovell Sheehan, MD  hydrALAZINE (APRESOLINE) 10 MG tablet Take 20 mg by mouth 3 (three) times daily.    [provider]  HYDROcodone-acetaminophen (NORCO/VICODIN) 5-325 MG tablet Take 1 tablet by mouth every 4 (four) hours as needed for moderate pain. 12/15/20 12/15/21  Lovell Sheehan, MD  lidocaine (XYLOCAINE) 5 % ointment Apply 1 application topically as needed for moderate pain.    [provider]  lisinopril (PRINIVIL,ZESTRIL) 40 MG tablet Take 1 tablet (40 mg total) by mouth daily. 05/04/16   Roselee Nova, MD  rosuvastatin (CRESTOR) 20 MG tablet Take 20 mg by mouth daily. 11/27/20   [provider]  terazosin (HYTRIN) 5 MG capsule Take 5 mg by mouth at bedtime. 11/28/20   [provider]      Allergies    Patient has no known allergies.    Review of Systems   Review of Systems  Physical Exam Updated Vital Signs BP (!) 137/92  (BP Location: Right Arm)   Pulse (!) 59   Temp 98.4 F (36.9 C) (Oral)   Resp 17   Ht '5\' 6"'$  (1.676 m)   Wt 93.9 kg   SpO2 99%   BMI 33.41 kg/m  Physical Exam Vitals and nursing note reviewed.  Eyes:     Pupils: Pupils are equal, round, and reactive to light.  Abdominal:     Tenderness: There is no abdominal tenderness.  Musculoskeletal:     Cervical back: Neck supple.     Comments: Pain with straight leg raise bilaterally.  No tenderness on lateral hips.  No abdominal tenderness.  Skin:    General: Skin is warm.  Neurological:     Mental Status: He is alert and oriented to person, place, and time.    ED Results / Procedures / Treatments   Labs (all labs ordered are listed, but only abnormal results are displayed) Labs Reviewed - No data to display  EKG None  Radiology DG Hips Bilat W or Wo Pelvis 2 Views  Result Date: 09/11/2021 CLINICAL DATA:  Pain. EXAM: DG HIP (WITH OR WITHOUT PELVIS) 2V BILAT COMPARISON:  Left hip x-ray 08/25/2016. FINDINGS: There is a new left hip arthroplasty in anatomic alignment. No evidence for hardware loosening. 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 No acute fractures are seen. Prostate radiotherapy seeds are present. IMPRESSION: 1. No acute fracture or dislocation. 2. 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 3. Left hip arthroplasty appears uncomplicated. Electronically Signed   By: Ronney Asters M.D.   On: 09/11/2021 20:48    Procedures Procedures    Medications Ordered in ED Medications  ketorolac (TORADOL) 15 MG/ML injection 15 mg (15 mg Intramuscular Given 09/11/21 2144)  predniSONE (DELTASONE) tablet 40 mg (40 mg Oral Given 09/11/21 2144)    ED Course/ Medical Decision Making/ A&P                           Medical Decision Making Risk Prescription drug management.   Patient with pelvic/hip pain.  Began after a near fall.  X-rays done and  reassuring.  Does show degenerative changes on the right and previous surgery on the left.  Treat symptomatically.  Some Toradol given.  I think it is low risk for the single dose.  Also given some prednisone.  Will discharge home and continue his other medicines.  Has orthopedic follow-up already.  Appears stable for discharge.  Doubt severe intra-abdominal or pelvic pathology        Final Clinical Impression(s) / ED Diagnoses Final diagnoses:  Pelvic pain    Rx / DC Orders ED Discharge Orders          Ordered    predniSONE (DELTASONE) 20 MG tablet  Daily        09/11/21 2216              Davonna Belling, MD 09/11/21 2328

## 2021-09-11 NOTE — ED Provider Triage Note (Signed)
Emergency Medicine Provider Triage Evaluation Note  Mark [name] , a 81 y.o. male  was evaluated in triage.  Pt complains of hip pain.  He reports both of his hips have been hurting.  He denies any specific injury. He otherwise feels well.  Physical Exam  BP (!) 146/74 (BP Location: Left Arm)   Pulse 64   Temp 98.4 F (36.9 C) (Oral)   Resp 18   Ht '5\' 6"'$  (1.676 m)   Wt 93.9 kg   SpO2 99%   BMI 33.41 kg/m  Gen:   Awake, no distress   Resp:  Normal effort  MSK:   Moves extremities without difficulty  Other:  Normal speech  Medical Decision Making  Medically screening exam initiated at 8:25 PM.  Appropriate orders placed.  Erenest Rasher was informed that the remainder of the evaluation will be completed by another provider, this initial triage assessment does not replace that evaluation, and the importance of remaining in the ED until their evaluation is complete.     Lorin Glass, [place] 09/11/21 2026

## 2021-09-11 NOTE — ED Triage Notes (Signed)
Pt complains of bilateral hip pain since this morning. Pt states that he was unable to get off of his couch this morning.

## 2021-09-14 ENCOUNTER — Inpatient Hospital Stay: Payer: No Typology Code available for payment source

## 2021-09-14 ENCOUNTER — Other Ambulatory Visit: Payer: Self-pay

## 2021-09-14 ENCOUNTER — Encounter: Payer: Self-pay | Admitting: Emergency Medicine

## 2021-09-14 ENCOUNTER — Emergency Department: Payer: No Typology Code available for payment source

## 2021-09-14 ENCOUNTER — Inpatient Hospital Stay
Admission: EM | Admit: 2021-09-14 | Discharge: 2021-09-18 | DRG: 552 | Disposition: A | Discharge status: Skilled Nursing Facility | Payer: No Typology Code available for payment source | Attending: Internal Medicine | Admitting: Internal Medicine

## 2021-09-14 DIAGNOSIS — M47812 Spondylosis without myelopathy or radiculopathy, cervical region: Secondary | ICD-10-CM | POA: Diagnosis present

## 2021-09-14 DIAGNOSIS — E669 Obesity, unspecified: Secondary | ICD-10-CM | POA: Diagnosis present

## 2021-09-14 DIAGNOSIS — R52 Pain, unspecified: Secondary | ICD-10-CM | POA: Diagnosis not present

## 2021-09-14 DIAGNOSIS — R509 Fever, unspecified: Secondary | ICD-10-CM | POA: Diagnosis not present

## 2021-09-14 DIAGNOSIS — R5381 Other malaise: Secondary | ICD-10-CM | POA: Diagnosis not present

## 2021-09-14 DIAGNOSIS — Z6831 Body mass index (BMI) 31.0-31.9, adult: Secondary | ICD-10-CM

## 2021-09-14 DIAGNOSIS — E876 Hypokalemia: Secondary | ICD-10-CM | POA: Diagnosis present

## 2021-09-14 DIAGNOSIS — M19041 Primary osteoarthritis, right hand: Secondary | ICD-10-CM | POA: Diagnosis present

## 2021-09-14 DIAGNOSIS — R29898 Other symptoms and signs involving the musculoskeletal system: Secondary | ICD-10-CM

## 2021-09-14 DIAGNOSIS — R569 Unspecified convulsions: Secondary | ICD-10-CM

## 2021-09-14 DIAGNOSIS — M25512 Pain in left shoulder: Secondary | ICD-10-CM | POA: Diagnosis present

## 2021-09-14 DIAGNOSIS — I10 Essential (primary) hypertension: Secondary | ICD-10-CM | POA: Diagnosis present

## 2021-09-14 DIAGNOSIS — M4802 Spinal stenosis, cervical region: Principal | ICD-10-CM | POA: Diagnosis present

## 2021-09-14 DIAGNOSIS — D72829 Elevated white blood cell count, unspecified: Secondary | ICD-10-CM | POA: Diagnosis present

## 2021-09-14 DIAGNOSIS — M160 Bilateral primary osteoarthritis of hip: Secondary | ICD-10-CM | POA: Diagnosis present

## 2021-09-14 DIAGNOSIS — N183 Chronic kidney disease, stage 3 unspecified: Secondary | ICD-10-CM | POA: Diagnosis not present

## 2021-09-14 DIAGNOSIS — R531 Weakness: Secondary | ICD-10-CM | POA: Diagnosis present

## 2021-09-14 DIAGNOSIS — N179 Acute kidney failure, unspecified: Secondary | ICD-10-CM | POA: Diagnosis present

## 2021-09-14 DIAGNOSIS — Z8546 Personal history of malignant neoplasm of prostate: Secondary | ICD-10-CM | POA: Diagnosis not present

## 2021-09-14 DIAGNOSIS — T380X5A Adverse effect of glucocorticoids and synthetic analogues, initial encounter: Secondary | ICD-10-CM | POA: Diagnosis present

## 2021-09-14 DIAGNOSIS — N1831 Chronic kidney disease, stage 3a: Secondary | ICD-10-CM | POA: Diagnosis present

## 2021-09-14 DIAGNOSIS — Z9181 History of falling: Secondary | ICD-10-CM | POA: Diagnosis not present

## 2021-09-14 DIAGNOSIS — Z7401 Bed confinement status: Secondary | ICD-10-CM | POA: Diagnosis not present

## 2021-09-14 DIAGNOSIS — M5125 Other intervertebral disc displacement, thoracolumbar region: Secondary | ICD-10-CM | POA: Diagnosis present

## 2021-09-14 DIAGNOSIS — Z79899 Other long term (current) drug therapy: Secondary | ICD-10-CM

## 2021-09-14 DIAGNOSIS — I129 Hypertensive chronic kidney disease with stage 1 through stage 4 chronic kidney disease, or unspecified chronic kidney disease: Secondary | ICD-10-CM | POA: Diagnosis present

## 2021-09-14 DIAGNOSIS — I2510 Atherosclerotic heart disease of native coronary artery without angina pectoris: Secondary | ICD-10-CM | POA: Diagnosis present

## 2021-09-14 DIAGNOSIS — M19042 Primary osteoarthritis, left hand: Secondary | ICD-10-CM | POA: Diagnosis present

## 2021-09-14 DIAGNOSIS — M25511 Pain in right shoulder: Secondary | ICD-10-CM | POA: Diagnosis present

## 2021-09-14 DIAGNOSIS — E785 Hyperlipidemia, unspecified: Secondary | ICD-10-CM | POA: Diagnosis not present

## 2021-09-14 DIAGNOSIS — Z923 Personal history of irradiation: Secondary | ICD-10-CM

## 2021-09-14 DIAGNOSIS — M549 Dorsalgia, unspecified: Secondary | ICD-10-CM | POA: Diagnosis not present

## 2021-09-14 DIAGNOSIS — Z8249 Family history of ischemic heart disease and other diseases of the circulatory system: Secondary | ICD-10-CM

## 2021-09-14 DIAGNOSIS — M6259 Muscle wasting and atrophy, not elsewhere classified, multiple sites: Secondary | ICD-10-CM | POA: Diagnosis not present

## 2021-09-14 DIAGNOSIS — R41841 Cognitive communication deficit: Secondary | ICD-10-CM | POA: Diagnosis not present

## 2021-09-14 DIAGNOSIS — N400 Enlarged prostate without lower urinary tract symptoms: Secondary | ICD-10-CM | POA: Diagnosis not present

## 2021-09-14 DIAGNOSIS — E7800 Pure hypercholesterolemia, unspecified: Secondary | ICD-10-CM | POA: Diagnosis present

## 2021-09-14 DIAGNOSIS — R2681 Unsteadiness on feet: Secondary | ICD-10-CM | POA: Diagnosis not present

## 2021-09-14 LAB — URINALYSIS, ROUTINE W REFLEX MICROSCOPIC
Bacteria, UA: NONE SEEN
Bilirubin Urine: NEGATIVE
Glucose, UA: NEGATIVE mg/dL
Ketones, ur: NEGATIVE mg/dL
Leukocytes,Ua: NEGATIVE
Nitrite: NEGATIVE
Protein, ur: 30 mg/dL — AB
Specific Gravity, Urine: 1.018 (ref 1.005–1.030)
Squamous Epithelial / HPF: NONE SEEN (ref 0–5)
pH: 5 (ref 5.0–8.0)

## 2021-09-14 LAB — COMPREHENSIVE METABOLIC PANEL
ALT: 17 U/L (ref 0–44)
AST: 37 U/L (ref 15–41)
Albumin: 3.6 g/dL (ref 3.5–5.0)
Alkaline Phosphatase: 63 U/L (ref 38–126)
Anion gap: 9 (ref 5–15)
BUN: 44 mg/dL — ABNORMAL HIGH (ref 8–23)
CO2: 22 mmol/L (ref 22–32)
Calcium: 8.7 mg/dL — ABNORMAL LOW (ref 8.9–10.3)
Chloride: 104 mmol/L (ref 98–111)
Creatinine, Ser: 1.75 mg/dL — ABNORMAL HIGH (ref 0.61–1.24)
GFR, Estimated: 39 mL/min — ABNORMAL LOW (ref >60)
Glucose, Bld: 140 mg/dL — ABNORMAL HIGH (ref 70–99)
Potassium: 3.6 mmol/L (ref 3.5–5.1)
Sodium: 135 mmol/L (ref 135–145)
Total Bilirubin: 0.8 mg/dL (ref 0.3–1.2)
Total Protein: 8 g/dL (ref 6.5–8.1)

## 2021-09-14 LAB — CBC
HCT: 36.2 % — ABNORMAL LOW (ref 39.0–52.0)
HCT: 36.2 % — ABNORMAL LOW (ref 39.0–52.0)
Hemoglobin: 11.4 g/dL — ABNORMAL LOW (ref 13.0–17.0)
Hemoglobin: 11.5 g/dL — ABNORMAL LOW (ref 13.0–17.0)
MCH: 28.4 pg (ref 26.0–34.0)
MCH: 27.9 pg (ref 26.0–34.0)
MCHC: 31.5 g/dL (ref 30.0–36.0)
MCHC: 31.8 g/dL (ref 30.0–36.0)
MCV: 90 fL (ref 80.0–100.0)
MCV: 87.9 fL (ref 80.0–100.0)
Platelets: 293 10*3/uL (ref 150–400)
Platelets: 313 10*3/uL (ref 150–400)
RBC: 4.02 MIL/uL — ABNORMAL LOW (ref 4.22–5.81)
RBC: 4.12 MIL/uL — ABNORMAL LOW (ref 4.22–5.81)
RDW: 13.1 % (ref 11.5–15.5)
RDW: 13.1 % (ref 11.5–15.5)
WBC: 15.3 10*3/uL — ABNORMAL HIGH (ref 4.0–10.5)
WBC: 14.7 10*3/uL — ABNORMAL HIGH (ref 4.0–10.5)
nRBC: 0 % (ref 0.0–0.2)
nRBC: 0 % (ref 0.0–0.2)

## 2021-09-14 LAB — CK: Total CK: 156 U/L (ref 49–397)

## 2021-09-14 LAB — SEDIMENTATION RATE: Sed Rate: 58 mm/hr — ABNORMAL HIGH (ref 0–20)

## 2021-09-14 LAB — TROPONIN I (HIGH SENSITIVITY)
Troponin I (High Sensitivity): 10 ng/L (ref <18)
Troponin I (High Sensitivity): 16 ng/L (ref <18)

## 2021-09-14 LAB — CREATININE, SERUM
Creatinine, Ser: 1.39 mg/dL — ABNORMAL HIGH (ref 0.61–1.24)
GFR, Estimated: 51 mL/min — ABNORMAL LOW (ref >60)

## 2021-09-14 LAB — LIPASE, BLOOD: Lipase: 29 U/L (ref 11–51)

## 2021-09-14 LAB — PROCALCITONIN: Procalcitonin: 0.38 ng/mL

## 2021-09-14 MED ORDER — ROSUVASTATIN CALCIUM 10 MG PO TABS
20.0000 mg | ORAL_TABLET | Freq: Every day | ORAL | Status: DC
  Administered 2021-09-15 – 2021-09-18 (×4): 20 mg via ORAL
  Filled 2021-09-14 (×4): qty 2

## 2021-09-14 MED ORDER — ENOXAPARIN SODIUM 60 MG/0.6ML IJ SOSY
0.5000 mg/kg | PREFILLED_SYRINGE | INTRAMUSCULAR | Status: DC
  Administered 2021-09-14 – 2021-09-15 (×2): 47.5 mg via SUBCUTANEOUS
  Filled 2021-09-14 (×2): qty 0.6

## 2021-09-14 MED ORDER — TERAZOSIN HCL 5 MG PO CAPS
5.0000 mg | ORAL_CAPSULE | Freq: Every day | ORAL | Status: DC
  Administered 2021-09-14 – 2021-09-17 (×4): 5 mg via ORAL
  Filled 2021-09-14 (×5): qty 1

## 2021-09-14 MED ORDER — DOCUSATE SODIUM 100 MG PO CAPS
100.0000 mg | ORAL_CAPSULE | Freq: Every day | ORAL | Status: DC | PRN
Start: 2021-09-14 — End: 2021-09-18
  Administered 2021-09-16: 100 mg via ORAL
  Filled 2021-09-14: qty 1

## 2021-09-14 MED ORDER — ACETAMINOPHEN 325 MG PO TABS
650.0000 mg | ORAL_TABLET | Freq: Four times a day (QID) | ORAL | Status: DC | PRN
  Administered 2021-09-15 – 2021-09-18 (×8): 650 mg via ORAL
  Filled 2021-09-14 (×10): qty 2

## 2021-09-14 MED ORDER — ACETAMINOPHEN 650 MG RE SUPP: 650.0000 mg | Freq: Four times a day (QID) | RECTAL | Status: DC | PRN

## 2021-09-14 MED ORDER — SODIUM CHLORIDE 0.9 % IV SOLN: INTRAVENOUS | Status: DC

## 2021-09-14 MED ORDER — ALBUTEROL SULFATE (2.5 MG/3ML) 0.083% IN NEBU: 2.5000 mg | INHALATION_SOLUTION | RESPIRATORY_TRACT | Status: DC | PRN

## 2021-09-14 MED ORDER — ONDANSETRON HCL 4 MG/2ML IJ SOLN: 4.0000 mg | Freq: Four times a day (QID) | INTRAMUSCULAR | Status: DC | PRN

## 2021-09-14 MED ORDER — AMLODIPINE BESYLATE 5 MG PO TABS
5.0000 mg | ORAL_TABLET | Freq: Every day | ORAL | Status: DC
Start: 2021-09-14 — End: 2021-09-17
  Administered 2021-09-14 – 2021-09-16 (×3): 5 mg via ORAL
  Filled 2021-09-14 (×3): qty 1

## 2021-09-14 MED ORDER — HYDRALAZINE HCL 10 MG PO TABS
20.0000 mg | ORAL_TABLET | Freq: Three times a day (TID) | ORAL | Status: DC
  Administered 2021-09-14 – 2021-09-18 (×11): 20 mg via ORAL
  Filled 2021-09-14 (×13): qty 2

## 2021-09-14 MED ORDER — SODIUM CHLORIDE 0.9 % IV SOLN
INTRAVENOUS | Status: AC
Start: 2021-09-14 — End: 2021-09-15

## 2021-09-14 MED ORDER — IOHEXOL 300 MG/ML  SOLN
80.0000 mL | Freq: Once | INTRAMUSCULAR | Status: AC | PRN
  Administered 2021-09-14: 80 mL via INTRAVENOUS

## 2021-09-14 MED ORDER — ONDANSETRON HCL 4 MG PO TABS: 4.0000 mg | ORAL_TABLET | Freq: Four times a day (QID) | ORAL | Status: DC | PRN

## 2021-09-14 NOTE — ED Triage Notes (Signed)
Pt in via EMS from home with c/o back pain and abd pain. Pt was seen at [facility] yesterday and was dx'd with arthritis and given meds. Pt has been sitting in a hard chair for most of the evening and body got stiff. 99.1 temp, CBG 169, 152/65, 999% RA, 80 HR. Pt has not taken pain meds today.

## 2021-09-14 NOTE — Consult Note (Signed)
Consulting Department: Internal Medicine  Primary Physician:  [facility] [place]  Chief Complaint:  Leg Weakness  History of Present Illness: 09/14/2021 Mark [name] is a 81 y.o. male who presents with the chief complaint of right lower extremity weakness.  He states that he had been in his usual state of health and was having pain and weakness in his right lower extremity.  He has a history of CKD, hypertension, prostate cancer status post radiation bilateral hip replacements.  He presented to the emergency department with weakness and dizziness.  He had a recent ER admission for similar symptoms approximately 1 to 2 days ago and was diagnosed with arthritis and given prednisone.  He had continued weakness at home and given the severity came back for second opinion.  He states that he has no radiating pain down his legs.  He is able to feel his rectum and genitalia.  He feels like his only weakness in his right quad.  No sensory changes.  No radiating pain.  He feels pain in his low back and hips when he is moved.    The symptoms are causing a significant impact on the patient's life.   Review of Systems:  A 10 point review of systems is negative, except for the pertinent positives and negatives detailed in the HPI.  Past Medical History: Past Medical History:  Diagnosis Date   Arthritis    Lower Back, Hips, and Hands.   Cancer [facility])    Prostate CA s/o Rad Tx   Chronic kidney disease    Coronary artery disease    Hypercholesterolemia    Hypertension    Radiation 09/30/2008-12/02/2010   7800 cGy  Dr.Ottelin    Past Surgical History: Past Surgical History:  Procedure Laterality Date   CATARACT SURGERY     KNEE ARTHROSCOPY Left    QUADRICEPS TENDON REPAIR Right 12/15/2020   Procedure: REPAIR QUADRICEP TENDON;  Surgeon: Lovell Sheehan, MD;  Location: [facility] ORS;  Service: Orthopedics;  Laterality: Right;    Allergies: Allergies as of 09/14/2021   (No Known Allergies)     Medications:  Current Facility-Administered Medications:    0.9 %  sodium chloride infusion, , Intravenous, Continuous, Clance Boll, MD, Last Rate: 75 mL/hr at 09/14/21 2130, New Bag at 09/14/21 2130   acetaminophen (TYLENOL) tablet 650 mg, 650 mg, Oral, Q6H PRN **OR** acetaminophen (TYLENOL) suppository 650 mg, 650 mg, Rectal, Q6H PRN, Clance Boll, MD   albuterol (PROVENTIL) (2.5 MG/3ML) 0.083% nebulizer solution 2.5 mg, 2.5 mg, Nebulization, Q2H PRN, Clance Boll, MD   amLODipine (NORVASC) tablet 5 mg, 5 mg, Oral, Daily, Myles Rosenthal A, MD, 5 mg at 09/14/21 1949   docusate sodium (COLACE) capsule 100 mg, 100 mg, Oral, Daily PRN, Clance Boll, MD   enoxaparin (LOVENOX) injection 47.5 mg, 0.5 mg/kg, Subcutaneous, Q24H, Myles Rosenthal A, MD   hydrALAZINE (APRESOLINE) tablet 20 mg, 20 mg, Oral, TID, Myles Rosenthal A, MD   ondansetron (ZOFRAN) tablet 4 mg, 4 mg, Oral, Q6H PRN **OR** ondansetron (ZOFRAN) injection 4 mg, 4 mg, Intravenous, Q6H PRN, Clance Boll, MD   [START ON 09/15/2021] rosuvastatin (CRESTOR) tablet 20 mg, 20 mg, Oral, Daily, Myles Rosenthal A, MD   terazosin (HYTRIN) capsule 5 mg, 5 mg, Oral, QHS, Clance Boll, MD   Social History: Social History   Tobacco Use   Smoking status: Never   Smokeless tobacco: Never  Vaping Use   Vaping Use: Never used  Substance Use Topics   Alcohol use: No   Drug use: No    Family Medical History: Family History  Problem Relation Age of Onset   Hypertension Father    Healthy Sister     Physical Examination: Vitals:   09/14/21 1949 09/14/21 2119  BP: (!) 178/74 (!) 177/80  Pulse:  79  Resp:  18  Temp:  (!) 100.8 F (38.2 C)  SpO2:  98%     General: Patient is well developed, well nourished, calm, collected, and in no apparent distress.  NEUROLOGICAL:  General: In no acute distress.   Awake, alert, oriented to person, place, and time.  Pupils equal round and  reactive to light.  Facial tone is symmetric.  Tongue protrusion is midline.  There is no pronator drift.  Strength:  Side Iliopsoas Quads Hamstring PF DF EHL  R '3 3 5 5 5 5  '$ L '5 5 5 5 5 5  '$ Thigh adduction full   Bilateral upper and lower extremity sensation is intact to light touch. Reflexes are Trace in patella and achilles. Clonus is not present.  Toes are down-going.    Imaging: Narrative & Impression  CLINICAL DATA:  Ataxia, nontraumatic, cervical pathology suspected   EXAM: MRI CERVICAL SPINE WITHOUT CONTRAST   TECHNIQUE: Multiplanar, multisequence MR imaging of the cervical spine was performed. No intravenous contrast was administered.   COMPARISON:  None Available.   FINDINGS: Alignment: Straightening of the cervical lordosis with grade 1 anterolisthesis of C7 on T1.   Vertebrae: No fracture, evidence of discitis, or bone lesion. Multilevel discogenic endplate marrow changes and anterior endplate osteophytosis.   Cord: Normal signal and morphology.   Posterior Fossa, vertebral arteries, paraspinal tissues: Negative.   Disc levels:   C2-C3: Disc osteophyte complex with central protrusion. Left greater than right facet arthropathy. Mild canal stenosis. No significant foraminal stenosis.   C3-C4: Disc osteophyte complex with central protrusion. Bilateral facet arthropathy and uncovertebral spurring. Mild canal stenosis with mild bilateral foraminal stenosis.   C4-C5: Disc osteophyte complex with mild bilateral facet and uncovertebral spurring. Moderate canal stenosis with moderate bilateral foraminal stenosis.   C5-C6: Disc osteophyte complex with right paracentral protrusion. Bilateral facet and uncovertebral arthropathy. Moderate-severe canal stenosis with severe right and moderate left foraminal stenosis.   C6-C7: Disc osteophyte complex, eccentric to the right. Bilateral facet and uncovertebral arthropathy. Moderate canal stenosis  with mild-to-moderate bilateral foraminal stenosis.   C7-T1: Disc uncovering without focal protrusion. Mild bilateral facet arthropathy. No foraminal or canal stenosis.   IMPRESSION: 1. 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 2. Multilevel bilateral foraminal stenosis, severe on the right at C5-6.     Electronically Signed   By: Davina Poke D.O.   On: 09/14/2021 17:34     Narrative & Impression  CLINICAL DATA:  Back trauma. Abnormal neuro exam. Lower leg weakness.   EXAM: MRI THORACIC SPINE WITHOUT CONTRAST   TECHNIQUE: Multiplanar, multisequence MR imaging of the thoracic spine was performed. No intravenous contrast was administered.   COMPARISON:  Same day CT chest, abdomen, and pelvis 09/14/2021   FINDINGS: Alignment:  Minimal 2 mm grade 1 anterolisthesis of C7 on T1.   Vertebrae: Vertebral body heights are maintained. Severe T8-9 and T9-10 disc space narrowing. Severe partially visualized C5-6, severe moderate C6-7, and mild C7-T1 disc space narrowing. Decreased disc hydration is greatest at C5-6 through T2-3 and T7-8 through T10-11. Mild-to-moderate C6-7 and mild  C7-T1, mild T2-3, mild T5-6, mild T6-7 chronic fat intensity marrow endplate degenerative changes. No acute fracture or destructive bone lesion.   Cord: Normal signal and morphology of the thoracic cord. The conus terminates at the superior L1 level.   Paraspinal and other soft tissues: The kidneys are partially visualized. There are partially visualized cysts within the bilateral kidneys, measuring at least 6 cm on the left.   Disc levels:   Please see contemporaneous MRI of the cervical spine for evaluation of the lower cervical spine levels.   T1-2: Mild broad-based posterior disc bulge. Mild-to-moderate bilateral facet joint hypertrophy. Mild right neuroforaminal stenosis.   T9-10: Mild  bilateral facet joint hypertrophy. Minimal posterior disc bulge. Mild narrowing of the left-greater-than-right lateral recesses. Borderline mild bilateral neuroforaminal stenosis.   T10-11: Mild-to-moderate bilateral facet joint hypertrophy. Midline posterior annular disc tear. Minimal posterior disc bulge. Mild-to-moderate bilateral neuroforaminal stenosis. No central canal stenosis.   T11-12: Mild bilateral facet joint hypertrophy. Minimal posterior disc bulge. Mild bilateral neuroforaminal stenosis. No central canal stenosis.   T12-L1: Right paracentral posterior disc protrusion measures up to 8 mm in AP dimension and contributes to severe right lateral recess stenosis. Mild-to-moderate bilateral facet joint hypertrophy. Moderate right and mild left neuroforaminal stenosis. Overall mild central canal stenosis.   IMPRESSION: Multilevel degenerative disc and joint changes as above. Mild-to-moderate bilateral T10-11 neuroforaminal stenosis.   T12-L1 right paracentral posterior disc protrusion contributes to severe right lateral recess stenosis and mild central canal stenosis. Moderate right and mild left neuroforaminal stenosis.     Electronically Signed   By: Yvonne Kendall M.D.   Narrative & Impression  CLINICAL DATA:  Lower back pain. Cauda equina syndrome suspected. Lower leg weakness.   EXAM: MRI LUMBAR SPINE WITHOUT CONTRAST   TECHNIQUE: Multiplanar, multisequence MR imaging of the lumbar spine was performed. No intravenous contrast was administered.   COMPARISON:  CT chest, abdomen and pelvis earlier same day 09/14/2021   FINDINGS: Segmentation:  Standard.   Alignment: 4 mm grade 1 anterolisthesis of L4 on L5. Mild levocurvature centered at L2-3.   Vertebrae: Vertebral body heights are maintained. Scattered vertebral body fat intensity hemangiomas. Mild L2-3, severe posterior L3-4: Mild posterior L4-5 disc space narrowing. Mild-to-moderate chronic fat  intensity marrow endplate degenerative changes at the posterior L3-4 level. No acute fracture.   Conus medullaris and cauda equina: Conus extends to the superior L1 level. Conus and cauda equina appear normal.   Paraspinal and other soft tissues: The kidneys are partially visualized. There are decreased T1 increased T2 signal cysts within the left-greater-than-right kidneys. No abdominal aortic aneurysm.   Disc levels:   T12-L1: Right paracentral posterior disc protrusion measures up to 8 mm in AP dimension and contributes to severe right lateral recess stenosis. Mild-to-moderate bilateral facet joint hypertrophy. Moderate right and mild left neuroforaminal stenosis. Overall mild central canal stenosis.   L1-2: Mild bilateral facet joint hypertrophy. No posterior disc bulge central canal narrowing, or neuroforaminal stenosis.   L2-3: Mild-to-moderate bilateral facet joint hypertrophy. Mild broad-based posterior disc osteophyte complex. Mild right neuroforaminal stenosis. Mild narrowing of the lateral recesses and borderline mild central canal stenosis.   L3-4: Mild-to-moderate bilateral facet hypertrophy. Moderate broad-based posterior disc osteophyte complex with moderate bilateral intraforaminal extension. Moderate bilateral neuroforaminal stenosis. Moderate narrowing of the lateral recesses and moderate central canal stenosis.   L4-5: Severe bilateral facet joint hypertrophy. Mild right facet joint effusion. Posterior disc uncovering with minimal posterior disc bulge. Moderate bilateral neuroforaminal stenosis. Severe narrowing of  the lateral recesses and severe central canal stenosis.   L5-S1: Severe left-greater-than-right facet joint hypertrophy. Small left intraforaminal annular disc tear. No significant posterior disc bulge. No significant neuroforaminal stenosis. No central canal stenosis.   IMPRESSION: 1. Multilevel degenerative disc and joint changes as above.  Grade 1 anterolisthesis of L4 on L5. 2. Moderate L3-4 and severe L4-5 central canal stenosis. 3. Multilevel neuroforaminal stenosis including moderate bilateral L3-4 and moderate bilateral L4-5 neuroforaminal stenosis.     Electronically Signed   By: Yvonne Kendall M.D.   On: 09/14/2021 21:44    I have personally reviewed the images and agree with the above interpretation.  Labs:    Latest Ref Rng & Units 09/14/2021    7:28 PM 09/14/2021    9:53 AM 12/12/2020    9:02 AM  CBC  WBC 4.0 - 10.5 K/uL 14.7   15.3   5.1    Hemoglobin 13.0 - 17.0 g/dL 11.5   11.4   12.8    Hematocrit 39.0 - 52.0 % 36.2   36.2   40.0    Platelets 150 - 400 K/uL 313   293   231         Assessment and Plan: Mr. Remedios is a pleasant 81 y.o. male with weakness in his right lower extremity.  He denies any new bowel or bladder incontinence.  Denies any sensation changes.  Denies any radiating pain.  This is relatively acute onset of right-sided leg weakness without sensory changes.  On physical exam he is full strength are at least 4+ in the bilateral posterior lower extremities plantarflexion dorsiflexion EHL.  In the right lower extremity is approximately 3 out of 5 knee extension and 3 out of 5 hip flexion.  On the left he is 4+ out of 5 in both.  He has no sensory alterations.  His reflexes are absent bilaterally however he has had prior knee surgeries.  His imaging is detailed above which shows multilevel cervical stenosis without clear signal change, and a right-sided paramedian T12-L1 disc herniation.  We discussed his findings with him that some of his weakness could perhaps be coming from his disc herniation, however the severity of weakness and knee extension and hip flexion with the absence of any weakness in obturator function would argue against a radicular problem.  This is at the level of the conus which can sometimes make these symptoms difficult to discern.  He does have a history of prostate cancer and  radiation so could potentially be a delayed radiation effect with femoral predominance.  However, the patient clearly stated that he would not want any surgery on his back even if it meant that he would have prolonged weakness in his lower extremity and that this was potentially salvageable versus reversible.  I have discussed the condition with the patient, including showing the radiographs and discussing treatment options in layman's terms.     Stevan Born, MD/MSCR Dept. of Neurosurgery

## 2021-09-14 NOTE — ED Notes (Signed)
Pt transported to CT at this time.

## 2021-09-14 NOTE — ED Triage Notes (Signed)
Pt reports tried to get out of the bed last night and could not so he called EMS. EMS came out and checked him out and helped him into a chair where her has been since EMS picked him up today. Pt reports was seen at [facility] Sunday for the same and was dx'd with arthritis. Pt reports continued stiffness and pain and abd pain as well.

## 2021-09-14 NOTE — ED Notes (Signed)
Dr. Quentin Cornwall MD at bedside for evaluation

## 2021-09-14 NOTE — ED Notes (Signed)
Called lab at this time to add blood work to pt's previous collection

## 2021-09-14 NOTE — ED Notes (Addendum)
Pt c/o lower back pain since Sunday states that he went to [facility] and they told him it was only arthritis, states it is stiff in his back and now is also having abd pain. Pt also having troubles urinating. Pt is A&Ox4 and NAD   Pt 2 person assist from the wheelchair to stretcher, pt normally able to ambulate without any issues.

## 2021-09-14 NOTE — H&P (Signed)
History and Physical    Mark [name] GNF:[id number] DOB: [birth year] DOA: 09/14/2021  [facility]P: [facility] [place]  Patient coming from: home  I have personally briefly reviewed patient's old medical records in [place]  Chief Complaint: weakness, change in ms  HPI: Mark [name] is a 81 y.o. 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 Patient of note has interim history of recent ER visit [place] for hip pain at which time he was diagnosed with arthritis treated with Toradol as well as prednisone and discharged home. Since then at home he has had decrease mobility due to pain and per wife.  Per wife this am on attempting to help him out of bed he was unable to get up and noted he felt still and that his legs were weak and noted increase pain. Due to this EMS was called who assisted him to chair. Patient however has not been able to move from chair since then and EMS was called to bring patient to ED. Per  wife patient has period were he had a shaking sleep and was some what confused but she states this has since resolved. Patient currently notes no current confusion and states he remembers all events and denies any episode of presyncope. He notes no n/v/d/ and does endorse some constipation. He also notes his leaves are weak and has overall body weakness. He denies any pain on urination, no cough no fever no chills. He notes lower pelvic pain and states back pain when he tries to stand.   ED Course:  Afeb, bp 140/73, hr 79, rr 20 , sat 97% on ra  Wbc: 15.3, hgb 11.4plt 293 NA 135, K 3.6, gly 140, cr 1.75 (1.39) CK156   Ct head NAD MRI head: MRI cervical spine: 1. Advanced multilevel cervical spondylosis with  moderate-severe canal stenosis at C5-C6 and moderate canal stenosis at C4-5 and C6-7. 2. Multilevel bilateral foraminal stenosis, severe on the right at C5-6.   CT chest abd: 1. No acute abnormality in the chest, abdomen or pelvis. 2.  Calcific coronary artery and aortic atherosclerosis. 3. Prominent stool in the right, proximal transverse and proximal descending colon.   Review of Systems: As per HPI otherwise 10 point review of systems negative.   Past Medical History:  Diagnosis Date   Arthritis    Lower Back, Hips, and Hands.   Cancer [facility])    Prostate CA s/o Rad Tx   Chronic kidney disease    Coronary artery disease    Hypercholesterolemia    Hypertension    Radiation 09/30/2008-12/02/2010   7800 cGy  Dr.Ottelin    Past Surgical History:  Procedure Laterality Date   CATARACT SURGERY     KNEE ARTHROSCOPY Left    QUADRICEPS TENDON REPAIR Right 12/15/2020   Procedure: REPAIR QUADRICEP TENDON;  Surgeon: Lovell Sheehan, MD;  Location: [facility] ORS;  Service: Orthopedics;  Laterality: Right;     reports that he has never smoked. He has never used smokeless tobacco. He reports that he does not drink alcohol and does not use drugs.  No Known Allergies  Family History  Problem Relation Age of Onset   Hypertension Father    Healthy Sister     Prior to Admission medications  Medication Sig Start Date End Date Taking? Authorizing Provider  amLODipine (NORVASC) 5 MG tablet Take 5 mg by mouth daily. 07/20/21  Yes [provider]  docusate sodium (COLACE) 100 MG capsule Take 1 capsule (100 mg total) by mouth daily as needed. 12/15/20 12/15/21 Yes Lovell Sheehan, MD  hydrALAZINE (APRESOLINE) 10 MG tablet Take 20 mg by mouth 3 (three) times daily.   Yes [provider]  lisinopril (PRINIVIL,ZESTRIL) 40 MG tablet Take 1 tablet (40 mg total) by mouth daily. 05/04/16  Yes Rochel Brome A, MD  rosuvastatin (CRESTOR) 20 MG tablet Take 20 mg by mouth daily. 11/27/20  Yes  [provider]  terazosin (HYTRIN) 5 MG capsule Take 5 mg by mouth at bedtime. 11/28/20  Yes [provider]  amLODipine (NORVASC) 10 MG tablet Take 1 tablet (10 mg total) by mouth daily. Patient not taking: Reported on 09/14/2021 05/04/16   Roselee Nova, MD  HYDROcodone-acetaminophen (NORCO/VICODIN) 5-325 MG tablet Take 1 tablet by mouth every 4 (four) hours as needed for moderate pain. Patient not taking: Reported on 09/14/2021 12/15/20 12/15/21  Lovell Sheehan, MD  lidocaine (XYLOCAINE) 5 % ointment Apply 1 application topically as needed for moderate pain.    [provider]  predniSONE (DELTASONE) 20 MG tablet Take 1 tablet (20 mg total) by mouth daily. Patient not taking: Reported on 09/14/2021 09/11/21   Davonna Belling, MD    Physical Exam: Vitals:   09/14/21 0923 09/14/21 0926 09/14/21 1108 09/14/21 1413  BP:  140/73 (!) 154/78 (!) 160/106  Pulse:  79 78 78  Resp:  '20 20 18  '$ Temp:  97.9 F (36.6 C)    TempSrc:  Oral    SpO2:  97% 98% 98%  Weight: 93.9 kg     Height: '5\' 6"'$  (1.676 m)        Vitals:   09/14/21 0923 09/14/21 0926 09/14/21 1108 09/14/21 1413  BP:  140/73 (!) 154/78 (!) 160/106  Pulse:  79 78 78  Resp:  '20 20 18  '$ Temp:  97.9 F (36.6 C)    TempSrc:  Oral    SpO2:  97% 98% 98%  Weight: 93.9 kg     Height: '5\' 6"'$  (1.676 m)     Constitutional: NAD, calm, comfortable Eyes: PERRL, lids and conjunctivae normal ENMT: Mucous membranes are moist. Posterior pharynx clear of any exudate or lesions.Normal dentition.  Neck: normal, supple, no masses, no thyromegaly Respiratory: clear to auscultation bilaterally, no wheezing, no crackles. Normal respiratory effort. No accessory muscle use.  Cardiovascular: Regular rate and rhythm, no murmurs / rubs / gallops. No extremity edema. 2+ pedal pulses. No carotid bruits.  Abdomen: no tenderness, no masses palpated. No hepatosplenomegaly. Bowel sounds positive.  Musculoskeletal: no clubbing /  cyanosis. No joint deformity upper and lower extremities. Good ROM, no contractures. Normal muscle tone.  Skin: no rashes, lesions, ulcers. No induration Neurologic: CN 2-12 grossly intact. Sensation intact, Strength 3+-4/5 in all lower ext , due to pain/weakness  Psychiatric: Normal judgment and insight. Alert and oriented x 3. Normal mood.    Labs on Admission: I have personally reviewed following labs and imaging studies  CBC: Recent Labs  Lab 09/14/21 0953  WBC 15.3*  HGB 11.4*  HCT 36.2*  MCV 90.0  PLT 681   Basic Metabolic Panel: Recent Labs  Lab 09/14/21 0953  NA 135  K 3.6  CL 104  CO2 22  GLUCOSE 140*  BUN 44*  CREATININE 1.75*  CALCIUM 8.7*   GFR: Estimated Creatinine Clearance: 36.1 mL/min (A) (by C-G formula based on SCr of 1.75 mg/dL (H)). Liver Function Tests: Recent Labs  Lab 09/14/21 0953  AST 37  ALT 17  ALKPHOS 63  BILITOT 0.8  PROT 8.0  ALBUMIN 3.6   Recent Labs  Lab 09/14/21 0953  LIPASE 29   No results for input(s): AMMONIA in the last 168 hours. Coagulation Profile: No results for input(s): INR, PROTIME in the last 168 hours. Cardiac Enzymes: Recent Labs  Lab 09/14/21 0953  CKTOTAL 156   BNP (last 3 results) No results for input(s): PROBNP in the last 8760 hours. HbA1C: No results for input(s): HGBA1C in the last 72 hours. CBG: No results for input(s): GLUCAP in the last 168 hours. Lipid Profile: No results for input(s): CHOL, HDL, LDLCALC, TRIG, CHOLHDL, LDLDIRECT in the last 72 hours. Thyroid Function Tests: No results for input(s): TSH, T4TOTAL, FREET4, T3FREE, THYROIDAB in the last 72 hours. Anemia Panel: No results for input(s): VITAMINB12, FOLATE, FERRITIN, TIBC, IRON, RETICCTPCT in the last 72 hours. Urine analysis:    Component Value Date/Time   COLORURINE YELLOW (A) 09/14/2021 0953   APPEARANCEUR HAZY (A) 09/14/2021 0953   LABSPEC 1.018 09/14/2021 0953   PHURINE 5.0 09/14/2021 0953   GLUCOSEU NEGATIVE  09/14/2021 0953   HGBUR MODERATE (A) 09/14/2021 0953   BILIRUBINUR NEGATIVE 09/14/2021 0953   KETONESUR NEGATIVE 09/14/2021 0953   PROTEINUR 30 (A) 09/14/2021 0953   NITRITE NEGATIVE 09/14/2021 0953   LEUKOCYTESUR NEGATIVE 09/14/2021 0953    Radiological Exams on Admission: DG Chest 2 View  Result Date: 09/14/2021 CLINICAL DATA:  81 year old male with history of lower back and shoulder pain. Evaluate for infiltrate. EXAM: CHEST - 2 VIEW COMPARISON:  No priors. FINDINGS: Lung volumes are low. Ill-defined opacity in the left lung base which may reflect atelectasis and/or consolidation. No pleural effusions. No pneumothorax. No pulmonary nodule or mass noted. Pulmonary vasculature and the cardiomediastinal silhouette are within normal limits. Atherosclerotic calcifications in the thoracic aorta. IMPRESSION: 1. Low lung volumes with ill-defined opacity in the left lung base, which may reflect areas of atelectasis and/or consolidation. 2. Aortic atherosclerosis. Electronically Signed   By: Vinnie Langton M.D.   On: 09/14/2021 12:35   CT HEAD WO CONTRAST (5MM)  Result Date: 09/14/2021 CLINICAL DATA:  Altered mental status. EXAM: CT HEAD WITHOUT CONTRAST TECHNIQUE: Contiguous axial images were obtained from the base of the skull through the vertex without intravenous contrast. RADIATION DOSE REDUCTION: This exam was performed according to the departmental dose-optimization program which includes automated exposure control, adjustment of the mA and/or kV according to patient size and/or use of iterative reconstruction technique. COMPARISON:  None Available. FINDINGS: Brain: No evidence of intracranial hemorrhage, acute infarction, hydrocephalus, extra-axial collection, or mass lesion/mass effect. Moderate chronic small vessel disease is noted. Vascular:  No hyperdense vessel or other acute findings. Skull: No evidence of fracture or other significant bone abnormality. Sinuses/Orbits:  No acute findings.  Other: None. IMPRESSION: No acute intracranial abnormality. Moderate chronic small vessel disease. Electronically Signed   By: Marlaine Hind M.D.   On: 09/14/2021 16:04   MR Cervical Spine Wo Contrast  Result Date: 09/14/2021 CLINICAL DATA:  Ataxia, nontraumatic, cervical pathology suspected EXAM: MRI CERVICAL SPINE WITHOUT CONTRAST TECHNIQUE: Multiplanar, multisequence MR imaging of the cervical spine was performed. No intravenous contrast was administered. COMPARISON:  None Available. FINDINGS: Alignment: Straightening of the cervical lordosis with grade 1 anterolisthesis of C7 on T1. Vertebrae: No  fracture, evidence of discitis, or bone lesion. Multilevel discogenic endplate marrow changes and anterior endplate osteophytosis. Cord: Normal signal and morphology. Posterior Fossa, vertebral arteries, paraspinal tissues: Negative. Disc levels: C2-C3: Disc osteophyte complex with central protrusion. Left greater than right facet arthropathy. Mild canal stenosis. No significant foraminal stenosis. C3-C4: Disc osteophyte complex with central protrusion. Bilateral facet arthropathy and uncovertebral spurring. Mild canal stenosis with mild bilateral foraminal stenosis. C4-C5: Disc osteophyte complex with mild bilateral facet and uncovertebral spurring. Moderate canal stenosis with moderate bilateral foraminal stenosis. C5-C6: Disc osteophyte complex with right paracentral protrusion. Bilateral facet and uncovertebral arthropathy. Moderate-severe canal stenosis with severe right and moderate left foraminal stenosis. C6-C7: Disc osteophyte complex, eccentric to the right. Bilateral facet and uncovertebral arthropathy. Moderate canal stenosis with mild-to-moderate bilateral foraminal stenosis. C7-T1: Disc uncovering without focal protrusion. Mild bilateral facet arthropathy. No foraminal or canal stenosis. IMPRESSION: 1. Advanced multilevel cervical spondylosis with moderate-severe canal stenosis at C5-C6 and moderate  canal stenosis at C4-5 and C6-7. 2. Multilevel bilateral foraminal stenosis, severe on the right at C5-6. Electronically Signed   By: Davina Poke D.O.   On: 09/14/2021 17:34   CT CHEST ABDOMEN PELVIS W CONTRAST  Result Date: 09/14/2021 CLINICAL DATA:  Weakness, abdominal pain and shoulder pain. Clinical concern for lung infiltrate, diverticulitis, small bowel obstruction or mass. EXAM: CT CHEST, ABDOMEN, AND PELVIS WITH CONTRAST TECHNIQUE: Multidetector CT imaging of the chest, abdomen and pelvis was performed following the standard protocol during bolus administration of intravenous contrast. RADIATION DOSE REDUCTION: This exam was performed according to the departmental dose-optimization program which includes automated exposure control, adjustment of the mA and/or kV according to patient size and/or use of iterative reconstruction technique. CONTRAST:  43m OMNIPAQUE IOHEXOL 300 MG/ML  SOLN COMPARISON:  Chest radiographs obtained earlier today. Renal ultrasound dated 12/06/2013. Abdomen and pelvis CT dated 04/15/2020. FINDINGS: CT CHEST FINDINGS Cardiovascular: Atheromatous calcifications, including the coronary arteries and aorta. Normal sized heart. Mediastinum/Nodes: No enlarged mediastinal, hilar, or axillary lymph nodes. Thyroid gland, trachea, and esophagus demonstrate no significant findings. Lungs/Pleura: Bilateral dependent atelectasis.  No pleural fluid. Musculoskeletal: Thoracic and lower cervical spine degenerative changes. Mild bilateral gynecomastia. CT ABDOMEN PELVIS FINDINGS Hepatobiliary: Normal appearing gallbladder. Stable right lobe liver probable cyst. Pancreas: Unremarkable. No pancreatic ductal dilatation or surrounding inflammatory changes. Spleen: Normal in size without focal abnormality. Adrenals/Urinary Tract: Stable mild bilateral adrenal hyperplasia. Stable bilateral renal cysts. Unremarkable ureters and urinary bladder. Stomach/Bowel: Prominent stool in the right, proximal  transverse and proximal descending colon. Multiple small sigmoid colon diverticula without evidence diverticulitis. Normal appearing appendix, small bowel and stomach. Vascular/Lymphatic: Atheromatous arterial calcifications without aneurysm. No enlarged lymph nodes. Reproductive: Moderately enlarged prostate gland containing 3 small surgical clips or radiation seeds. Other: No abdominal wall hernia or abnormality. No abdominopelvic ascites. Musculoskeletal: Left hip prosthesis. Lumbar spine degenerative changes, including marked facet degenerative changes at the L4-5 and L5-S1 levels with associated grade 1 anterolisthesis at the L4-5 level. IMPRESSION: 1. No acute abnormality in the chest, abdomen or pelvis. 2.  Calcific coronary artery and aortic atherosclerosis. 3. Prominent stool in the right, proximal transverse and proximal descending colon. Electronically Signed   By: SClaudie ReveringM.D.   On: 09/14/2021 14:28    EKG: Independently reviewed. 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 -  Multilevel bilateral foraminal stenosis, severe on the right at C5-6 -acute on chronic debility over the last 3-4 days due to pain -neurosurgery consult/orthospine consult for further evaluation  -supportive pain medication  - PT/OT  -patient prior baseline ambulatory with walker ( 4 days ago)  -may possible need rehab stay   Leukocytosis -no fever no chills, thought to be due to recent steroid use   HTN -resume amlodipine    HLD -continue statin    Mild AKI on CKDIII -gently ivfs  -hold nephrotoxic medications   Prostate cancer - s/p radiation   DVT prophylaxis: heparin  Code Status: full Family Communication: discussed with wife at bedside  Disposition Plan: patient   expected to be admitted greater than 2 midnights  Consults called: Admission status: med tele    Clance Boll MD [facility] P If 7PM-7AM, please contact night-coverage [email or web address] Password [facility]  09/14/2021, 6:34 PM

## 2021-09-14 NOTE — ED Provider Notes (Signed)
[facility] Provider Note    Event Date/Time   First MD Initiated Contact with Patient 09/14/21 1124     (approximate)   History   Back Pain and Abdominal Pain   HPI  KYZER BLOWE is a 81 y.o. male with history of arthritis presents to the ER for generalized weakness unable to get out of his chair.  Was seen at [facility] yesterday and diagnosed with arthritis given Decadron as well as Toradol.  Patient went home and states that he has been unable to get of his chair.  EMS had to come twice.  Patient with significant weakness throughout he says.  Also complaining of abdominal pain.     Physical Exam   Triage Vital Signs: ED Triage Vitals  Enc Vitals Group     BP 09/14/21 0926 140/73     Pulse Rate 09/14/21 0926 79     Resp 09/14/21 0926 20     Temp 09/14/21 0926 97.9 F (36.6 C)     Temp Source 09/14/21 0926 Oral     SpO2 09/14/21 0926 97 %     Weight 09/14/21 0923 207 lb 0.2 oz (93.9 kg)     Height 09/14/21 0923 '5\' 6"'$  (1.676 m)     Head Circumference --      Peak Flow --      Pain Score 09/14/21 0922 0     Pain Loc --      Pain Edu? --      Excl. in [place]? --     Most recent vital signs: Vitals:   09/14/21 1108 09/14/21 1413  BP: (!) 154/78 (!) 160/106  Pulse: 78 78  Resp: 20 18  Temp:    SpO2: 98% 98%     Constitutional: Alert  Eyes: Conjunctivae are normal.  Head: Atraumatic. Nose: No congestion/rhinnorhea. Mouth/Throat: Mucous membranes are moist.   Neck: Painless ROM.  Cardiovascular:   Good peripheral circulation. Respiratory: Normal respiratory effort.  No retractions.  Gastrointestinal: Soft with mild generalized ttp Musculoskeletal:  no deformity Neurologic:  MAE spontaneously. No gross focal neurologic deficits are appreciated.  Skin:  Skin is warm, dry and intact. No rash noted. Psychiatric: Mood and affect are normal. Speech and behavior are normal.    ED Results / Procedures / Treatments   Labs (all labs  ordered are listed, but only abnormal results are displayed) Labs Reviewed  COMPREHENSIVE METABOLIC PANEL - Abnormal; Notable for the following components:      Result Value   Glucose, Bld 140 (*)    BUN 44 (*)    Creatinine, Ser 1.75 (*)    Calcium 8.7 (*)    GFR, Estimated 39 (*)    All other components within normal limits  CBC - Abnormal; Notable for the following components:   WBC 15.3 (*)    RBC 4.02 (*)    Hemoglobin 11.4 (*)    HCT 36.2 (*)    All other components within normal limits  URINALYSIS, ROUTINE W REFLEX MICROSCOPIC - Abnormal; Notable for the following components:   Color, Urine YELLOW (*)    APPearance HAZY (*)    Hgb urine dipstick MODERATE (*)    Protein, ur 30 (*)    All other components within normal limits  LIPASE, BLOOD  CK  TROPONIN I (HIGH SENSITIVITY)  TROPONIN I (HIGH SENSITIVITY)     EKG  ED ECG REPORT I, Merlyn Lot, the attending physician, personally viewed and interpreted this ECG.  Date: 09/14/2021  EKG Time: 9:29  Rate: 80  Rhythm: sinus  Axis: normal  Intervals:normal  ST&T Change: nonspecifici st and twave abn    RADIOLOGY Please see ED Course for my review and interpretation.  I personally reviewed all radiographic images ordered to evaluate for the above acute complaints and reviewed radiology reports and findings.  These findings were personally discussed with the patient.  Please see medical record for radiology report.   PROCEDURES:  Critical Care performed:   Procedures   MEDICATIONS ORDERED IN ED: Medications  iohexol (OMNIPAQUE) 300 MG/ML solution 80 mL (80 mLs Intravenous Contrast Given 09/14/21 1328)     IMPRESSION / MDM / ASSESSMENT AND PLAN / ED COURSE  I reviewed the triage vital signs and the nursing notes.                              Differential diagnosis includes, but is not limited to, Dehydration, sepsis, pna, uti, hypoglycemia, cva, drug effect, sbo, diverticulitis, AAA,  stone  Patient presented to ER with multiple complaints as described above.  This presenting complaint could reflect a potentially life-threatening illness therefore the patient will be placed on continuous pulse oximetry and telemetry for monitoring.  Laboratory evaluation will be sent to evaluate for the above complaints.  CT imagine ordered to evaluate for the above complaints.    Clinical Course as of 09/14/21 1614  Mon Sep 14, 2021  1338 Chest x-ray on my interpretation shows no evidence of infiltrate. [PR]  1348 White blood cell count is mildly elevated but patient was given 2 doses of Decadron yesterday suspect this is reactive secondary to steroid. [PR]  3614 CT on my interpretation without evidence of AAA.  Bilateral renal cysts no hydro.  Will await formal radiology report. [PR]  4315 CT imaging reviewed with patient and family.  Additional history provided by family states that he had episode this morning that appeared like seizure-like activity with shaking and unresponsive.  They have never seen him like this before.  Patient amnestic to this event.  No history of seizures. [PR]  4008 CT head with no acute abnormality.  Given history weakness unable to go home will order MRI to evaluate for possible CVA will admit to hospitalist for further work-up of seizure-like activity. [PR]    Clinical Course User Index [PR] Merlyn Lot, MD    Patient's presentation is most consistent with acute presentation with potential threat to life or bodily function.   FINAL CLINICAL IMPRESSION(S) / ED DIAGNOSES   Final diagnoses:  Weakness  Seizure-like activity ([place])     Rx / DC Orders   ED Discharge Orders     None        Note:  This document was prepared using Dragon voice recognition software and may include unintentional dictation errors.    Merlyn Lot, MD 09/14/21 [phone]

## 2021-09-15 ENCOUNTER — Encounter: Payer: Self-pay | Admitting: Internal Medicine

## 2021-09-15 DIAGNOSIS — M4802 Spinal stenosis, cervical region: Secondary | ICD-10-CM | POA: Diagnosis not present

## 2021-09-15 DIAGNOSIS — N179 Acute kidney failure, unspecified: Secondary | ICD-10-CM | POA: Diagnosis not present

## 2021-09-15 DIAGNOSIS — D72829 Elevated white blood cell count, unspecified: Secondary | ICD-10-CM | POA: Diagnosis present

## 2021-09-15 DIAGNOSIS — R509 Fever, unspecified: Secondary | ICD-10-CM

## 2021-09-15 LAB — PSA: Prostatic Specific Antigen: 0.39 ng/mL (ref 0.00–4.00)

## 2021-09-15 LAB — CBC
HCT: 33.9 % — ABNORMAL LOW (ref 39.0–52.0)
Hemoglobin: 10.9 g/dL — ABNORMAL LOW (ref 13.0–17.0)
MCH: 28.1 pg (ref 26.0–34.0)
MCHC: 32.2 g/dL (ref 30.0–36.0)
MCV: 87.4 fL (ref 80.0–100.0)
Platelets: 278 10*3/uL (ref 150–400)
RBC: 3.88 MIL/uL — ABNORMAL LOW (ref 4.22–5.81)
RDW: 12.9 % (ref 11.5–15.5)
WBC: 11.8 10*3/uL — ABNORMAL HIGH (ref 4.0–10.5)
nRBC: 0 % (ref 0.0–0.2)

## 2021-09-15 LAB — COMPREHENSIVE METABOLIC PANEL
ALT: 22 U/L (ref 0–44)
AST: 37 U/L (ref 15–41)
Albumin: 2.8 g/dL — ABNORMAL LOW (ref 3.5–5.0)
Alkaline Phosphatase: 58 U/L (ref 38–126)
Anion gap: 10 (ref 5–15)
BUN: 34 mg/dL — ABNORMAL HIGH (ref 8–23)
CO2: 24 mmol/L (ref 22–32)
Calcium: 8.2 mg/dL — ABNORMAL LOW (ref 8.9–10.3)
Chloride: 103 mmol/L (ref 98–111)
Creatinine, Ser: 1.35 mg/dL — ABNORMAL HIGH (ref 0.61–1.24)
GFR, Estimated: 53 mL/min — ABNORMAL LOW (ref >60)
Glucose, Bld: 129 mg/dL — ABNORMAL HIGH (ref 70–99)
Potassium: 3.2 mmol/L — ABNORMAL LOW (ref 3.5–5.1)
Sodium: 137 mmol/L (ref 135–145)
Total Bilirubin: 1 mg/dL (ref 0.3–1.2)
Total Protein: 7 g/dL (ref 6.5–8.1)

## 2021-09-15 LAB — RESPIRATORY PANEL BY PCR

## 2021-09-15 LAB — PROTIME-INR
INR: 1.2 (ref 0.8–1.2)
Prothrombin Time: 15.3 seconds — ABNORMAL HIGH (ref 11.4–15.2)

## 2021-09-15 LAB — C-REACTIVE PROTEIN: CRP: 21 mg/dL — ABNORMAL HIGH (ref <1.0)

## 2021-09-15 MED ORDER — HYDROCODONE-ACETAMINOPHEN 5-325 MG PO TABS: 1.0000 | ORAL_TABLET | Freq: Four times a day (QID) | ORAL | 0 refills | Status: DC | PRN

## 2021-09-15 NOTE — Assessment & Plan Note (Signed)
-   Continue home meds °

## 2021-09-15 NOTE — TOC Progression Note (Signed)
Transition of Care [facility]) - Progression Note    Patient Details  Name: [name] MRN: [id number] Date of Birth: 05/15/1940  [facility] CM/SW [place], RN Phone Number: 09/15/2021, 12:09 PM  Clinical Narrative:     Met with the patient  He is agreeable to a bedsearch PASSR obtained, FL2 completed, Bedsearch sent Will review bed offers once obtained  Expected Discharge Plan: [place] Barriers to Discharge: Ship broker, SNF Pending bed offer, Continued Medical Work up  Expected Discharge Plan and Services Expected Discharge Plan: [place] arrangements for the past 2 months: Single Family Home                                       Social Determinants of Health (SDOH) Interventions    Readmission Risk Interventions     View : No data to display.

## 2021-09-15 NOTE — Evaluation (Signed)
Occupational Therapy Evaluation Patient Details Name: Mark [name] MRN: [id number] DOB: Oct 17, 1940 Today's Date: 09/15/2021   History of Present Illness Patient is a 81 year old male who presented [name] with a chief complaint of RLE weakness. 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   Clinical Impression   Mark [name] was seen for OT evaluation this date. Prior [name] hospital admission, pt was Independent for mobility and ADLs. Pt lives with spouse. Pt presents [name] acute OT demonstrating impaired ADL performance and functional mobility 2/2 decreased activity tolerance, impaired functional use of non dominant LUE, and functional strength/ROM/balance deficits. Pt currently requires MOD A for UB bathing at bed level, MAX A for LB bathing bed level including rolling L+R. Pt maintains sidelying with MIN cues for ~3 min. SETUP + SUPERVISION tooth brushing at bed level. Pt would benefit from skilled OT [name] address noted impairments and functional limitations (see below for any additional details). Upon hospital discharge, recommend STR [name] maximize pt safety and return [name] PLOF.    Recommendations for follow up therapy are one component of a multi-disciplinary discharge planning process, led by the attending physician.  Recommendations may be updated based on patient status, additional functional criteria and insurance authorization.   Follow Up Recommendations  Skilled nursing-short term rehab (<3 hours/day)    Assistance Recommended at Discharge Frequent or constant Supervision/Assistance  Patient can return home with the following Two people [name] help with walking and/or transfers;Two people [name] help with bathing/dressing/bathroom    Functional Status Assessment  Patient has had a recent decline in their functional status and demonstrates the ability [name] make significant improvements in function in a reasonable and predictable amount of  time.  Equipment Recommendations  Other (comment) (defer)    Recommendations for Other Services       Precautions / Restrictions Precautions Precautions: Fall Restrictions Weight Bearing Restrictions: No      Mobility Bed Mobility Overal bed mobility: Needs Assistance Bed Mobility: Rolling Rolling: Max assist         General bed mobility comments: maintains sidelying with SBA.    Transfers                   General transfer comment: pt deferred          ADL either performed or assessed with clinical judgement   ADL Overall ADL's : Needs assistance/impaired                                       General ADL Comments: TOTAL A don B socks at bed level. MOD A for UB bathing at bed level, MAX A for LB bathing bed level including rolling L+R. SETUP + SUPERVISION tooth brushing at bed level      Pertinent Vitals/Pain Pain Assessment Pain Assessment: Faces Faces Pain Scale: Hurts whole lot Pain Location: L shoulder [name] touch and ROM Pain Descriptors / Indicators: Aching, Discomfort Pain Intervention(s): Limited activity within patient's tolerance, Repositioned     Hand Dominance Right   Extremity/Trunk Assessment Upper Extremity Assessment Upper Extremity Assessment: LUE deficits/detail;Generalized weakness LUE Deficits / Details: grip 4/5. elbow flexion/extension 3/5, pain limited shoulder ROM   Lower Extremity Assessment Lower Extremity Assessment: Defer [name] PT evaluation       Communication Communication Communication: No difficulties   Cognition Arousal/Alertness: Awake/alert Behavior During Therapy: [facility] for tasks  assessed/performed Overall Cognitive Status: No family/caregiver present [name] determine baseline cognitive functioning                                 General Comments: pt denies pain despite yelling with pain during Jauca expects [name] be discharged [name]::  Private residence Living Arrangements: Spouse/significant other Available Help at Discharge: Family;Available PRN/intermittently (wife still works) Type of Home: House Home Access: Stairs [name] enter [employer] of Steps: 1 Entrance Stairs-Rails: None Home Layout: One level     Bathroom Shower/Tub: Tub/shower unit;Walk-in shower   Bathroom Toilet: Standard Bathroom Accessibility: Yes   Home Equipment: Grab bars - tub/shower;Rolling Walker (2 wheels);Cane - single point          Prior Functioning/Environment Prior Level of Function : Independent/Modified Independent             Mobility Comments: No AD at baseline patient required assistance with getting out of bed from wife ADLs Comments: Independent/Mod I        OT Problem List: Decreased strength;Decreased range of motion;Decreased activity tolerance;Impaired balance (sitting and/or standing)      OT Treatment/Interventions: Therapeutic exercise;Self-care/ADL training;Energy conservation;DME and/or AE instruction;Therapeutic activities;Patient/family education;Balance training    OT Goals(Current goals can be found in the care plan section) Acute Rehab OT Goals Patient Stated Goal: [name] go home OT Goal Formulation: With patient Time For Goal Achievement: 09/29/21 Potential [name] Achieve Goals: Fair ADL Goals Pt Will Perform Grooming: with min guard assist;sitting;with set-up Pt Will Perform Lower Body Dressing: with min assist;bed level Pt Will Transfer [name] Toilet: with mod assist;stand pivot transfer;bedside commode  OT Frequency: Min 2X/week    Co-evaluation              AM-PAC OT "6 Clicks" Daily Activity     Outcome Measure Help from another person eating meals?: A Little Help from another person taking care of personal grooming?: A Lot Help from another person toileting, which includes using toliet, bedpan, or urinal?: A Lot Help from another person bathing (including washing, rinsing, drying)?:  A Lot Help from another person [name] put on and taking off regular upper body clothing?: A Lot Help from another person [name] put on and taking off regular lower body clothing?: A Lot 6 Click Score: 13   End of Session    Activity Tolerance: Patient tolerated treatment well;Patient limited by fatigue Patient left: in bed;with call bell/phone within reach;with bed alarm set;with nursing/sitter in room  OT Visit Diagnosis: Other abnormalities of gait and mobility (R26.89);Muscle weakness (generalized) (M62.81)                Time: 5809-9833 OT Time Calculation (min): 16 min Charges:  OT General Charges $OT Visit: 1 Visit OT Evaluation $OT Eval Low Complexity: 1 Low OT Treatments $Self Care/Home Management : 8-22 mins  Dessie Coma, M.S. OTR/L  09/15/21, 3:42 PM  ascom [phone]

## 2021-09-15 NOTE — Assessment & Plan Note (Addendum)
Taken from neurosurgery note Dr. 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  We discussed his findings with him that some of his weakness could perhaps be coming from his disc herniation, however the severity of weakness and knee extension and hip flexion with the absence of any weakness in obturator function would argue against a radicular problem.  This is at the level of the conus which can sometimes make these symptoms difficult to discern.  He does have a history of prostate cancer and radiation so could potentially be a delayed radiation effect with femoral predominance.  However, the patient clearly stated that he would not want any surgery on his back even if it meant that he would have prolonged weakness in his lower extremity and that this was potentially salvageable versus reversible."  Supportive pain meds  PT/OT recommend SNF rehab --> placement pending

## 2021-09-15 NOTE — Discharge Summary (Signed)
Physician Discharge Summary   Patient: Mark [name] MRN: [id number] DOB: 1940-10-21   Admit:     Date of Admission: 09/14/2021 Admitted from: home   Discharge: Date of discharge: TBD, medically stable for discharge on 09/15/21 pending SNF placement  Disposition: Skilled nursing facility Condition at discharge: good  CODE STATUS: FULL   Diet recommendation: Cardiac diet   Discharge Physician: Emeterio Reeve, DO    PCP: [facility]  Recommendations for Outpatient Follow-up:  Follow up with PCP [facility] in 2 weeks Please obtain labs/tests: none Please follow up on the following pending results: none Please re-consult neurosurgery if needed   Printed for patient on AVS:   "You have declined any surgery or intervention for your spinal arthritis / disc disease which is causing pain and weakness. If you decide you would like to speak again to neurosurgeon about options for intervention, please follow up with them.   Plan now is for discharge to rehab facility to improve strength and function. If worsening symptoms or other concerns, please seek medical attention! "    Brief/Interim Summary: Mr. Boyett is a 81 year old male, PMH HTN, HLD, CKD 3, prostate CA s/p radiation, lumbar spine and bilateral hip arthritis, status post hip replacement, chronic debility followed by outpatient PT T.  Presents to ED 09/14/2021, pelvic/hip pain, lower extremity weakness, dizziness.  Recent ER visit to [facility] for hip pain, treated for arthritis with Toradol and prednisone, discharged home.  Since then, decreased mobility, increased pain, wife noted fall this morning, patient was reporting increased weakness and difficulty ambulating.  EMS assisted him to chair but still unable to move from then, EMS called again and brought to ED.  Prior baseline ambulatory with walker 4 days prior to arrival.  Wife also reported episode shaking in sleep and mild confusion  but this had since resolved.  ED course, no significant concerns on vital signs, WBC slight elevation at 15.3.  CT head no concerns.  MRI head/cervical spine advanced multilevel cervical spondylosis, moderate to severe canal stenosis at C5-C6, moderate canal stenosis C4-C5 and C6-C7, multilevel bilateral foraminal stenosis severe on right C5-6.  CT chest noted coronary artery and aortic atherosclerosis.  Given imaging findings as well as acute on chronic debility/weakness neurosurgery consult was obtained, patient declined any surgical procedure or other invasive intervention.  PT/OT recommend SNF for rehab.  Consultants:  neurosurgery  Procedures:  none      Discharge Diagnoses: Principal Problem:   Stenosis of cervical spine region Active Problems:   Hypertension   Hypercholesterolemia   Weakness   Leukocytosis   AKI (acute kidney injury) ([place])   Fever    Assessment & Plan: Stenosis of cervical spine region Taken from neurosurgery note Dr. Tamala Julian 09/14/21: "multilevel cervical stenosis without clear signal change, and a right-sided paramedian T12-L1 disc herniation.  We discussed his findings with him that some of his weakness could perhaps be coming from his disc herniation, however the severity of weakness and knee extension and hip flexion with the absence of any weakness in obturator function would argue against a radicular problem.  This is at the level of the conus which can sometimes make these symptoms difficult to discern.  He does have a history of prostate cancer and radiation so could potentially be a delayed radiation effect with femoral predominance.  However, the patient clearly stated that he would not want any surgery on his back even if it meant that he  would have prolonged weakness in his lower extremity and that this was potentially salvageable versus reversible." Supportive pain meds PT/OT recommend SNF rehab --> placement pending  Leukocytosis Likely d/t  steroids   Hypertension Continue home meds  Hypercholesterolemia Cont home meds  AKI (acute kidney injury) ([place]) Cr trending down Monitor outpatient Encourage po hydration  Fever Fever overnight, pt has no complaints as of rounds, no concerns for infection on UA, CXR was followed w/ CT chest and no consolidation, no other s/s infection at this time. WBC down-trending (up likely d/t steroids)  Continue to monitor and reassess as needed               Discharge Instructions  Discharge Instructions     Diet - low sodium heart healthy   Complete by: As directed    Discharge instructions   Complete by: As directed    You have declined any surgery or intervention for your spinal arthritis / disc disease which is causing pain and weakness. If you decide you would like to speak again to neurosurgeon about options for intervention, please follow up with them.   Plan now is for discharge to rehab facility to improve strength and function. If worsening symptoms or other concerns, please seek medical attention!   Increase activity slowly   Complete by: As directed        Allergies as of 09/15/2021   No Known Allergies      Medication List     STOP taking these medications    predniSONE 20 MG tablet Commonly known as: DELTASONE       TAKE these medications    amLODipine 5 MG tablet Commonly known as: NORVASC Take 5 mg by mouth daily. What changed: Another medication with the same name was removed. Continue taking this medication, and follow the directions you see here.   docusate sodium 100 MG capsule Commonly known as: Colace Take 1 capsule (100 mg total) by mouth daily as needed.   hydrALAZINE 10 MG tablet Commonly known as: APRESOLINE Take 20 mg by mouth 3 (three) times daily.   HYDROcodone-acetaminophen 5-325 MG tablet Commonly known as: NORCO/VICODIN Take 1 tablet by mouth every 6 (six) hours as needed for moderate pain or severe pain. What  changed:  when to take this reasons to take this   lidocaine 5 % ointment Commonly known as: XYLOCAINE Apply 1 application topically as needed for moderate pain.   lisinopril 40 MG tablet Commonly known as: ZESTRIL Take 1 tablet (40 mg total) by mouth daily.   rosuvastatin 20 MG tablet Commonly known as: CRESTOR Take 20 mg by mouth daily.   terazosin 5 MG capsule Commonly known as: HYTRIN Take 5 mg by mouth at bedtime.        Diet Orders (From admission, onward)     Start     Ordered   09/15/21 0000  Diet - low sodium heart healthy        09/15/21 1301   09/14/21 1919  Diet Heart Room service appropriate? Yes; Fluid consistency: Thin  Diet effective now       Question Answer Comment  Room service appropriate? Yes   Fluid consistency: Thin      09/14/21 1920               No Known Allergies   Subjective: Patient seen and examined resting comfortably in hospital bed, no apparent distress, he states pain is controlled, no new weakness or other concerns.  Discharge Exam: Vitals:   09/15/21 0358 09/15/21 0736  BP: 134/75 (!) 163/77  Pulse: 76 84  Resp: 16   Temp: 98.8 F (37.1 C) 99.3 F (37.4 C)  SpO2: 99% 94%   Vitals:   09/15/21 0101 09/15/21 0200 09/15/21 0358 09/15/21 0736  BP: (!) 153/76  134/75 (!) 163/77  Pulse: 89  76 84  Resp: 18  16   Temp: (!) 103 F (39.4 C) 100.3 F (37.9 C) 98.8 F (37.1 C) 99.3 F (37.4 C)  TempSrc: Oral Oral    SpO2: 98%  99% 94%  Weight:      Height:        General: Pt is alert, awake, not in acute distress Cardiovascular: RRR, S1/S2 +, no rubs, no gallops Respiratory: CTA bilaterally, no wheezing, no rhonchi Abdominal: Soft, NT, ND, bowel sounds + Extremities: no edema, no cyanosis     The results of significant diagnostics from this hospitalization (including imaging, microbiology, ancillary and laboratory) are listed below for reference.     Microbiology: Recent Results (from the past 240  hour(s))  Respiratory (~20 pathogens) panel by PCR     Status: None   Collection Time: 09/14/21  7:28 PM   Specimen: Nasopharyngeal Swab; Respiratory  Result Value Ref Range Status   Adenovirus NOT DETECTED NOT DETECTED Final   Coronavirus 229E NOT DETECTED NOT DETECTED Final    Comment: (NOTE) The Coronavirus on the Respiratory Panel, DOES NOT test for the novel  Coronavirus (2019 nCoV)    Coronavirus HKU1 NOT DETECTED NOT DETECTED Final   Coronavirus NL63 NOT DETECTED NOT DETECTED Final   Coronavirus OC43 NOT DETECTED NOT DETECTED Final   Metapneumovirus NOT DETECTED NOT DETECTED Final   Rhinovirus / Enterovirus NOT DETECTED NOT DETECTED Final   Influenza A NOT DETECTED NOT DETECTED Final   Influenza B NOT DETECTED NOT DETECTED Final   Parainfluenza Virus 1 NOT DETECTED NOT DETECTED Final   Parainfluenza Virus 2 NOT DETECTED NOT DETECTED Final   Parainfluenza Virus 3 NOT DETECTED NOT DETECTED Final   Parainfluenza Virus 4 NOT DETECTED NOT DETECTED Final   Respiratory Syncytial Virus NOT DETECTED NOT DETECTED Final   Bordetella pertussis NOT DETECTED NOT DETECTED Final   Bordetella Parapertussis NOT DETECTED NOT DETECTED Final   Chlamydophila pneumoniae NOT DETECTED NOT DETECTED Final   Mycoplasma pneumoniae NOT DETECTED NOT DETECTED Final    Comment: Performed at [facility], [place]. [address] [place], [place] [address]     Labs: BNP (last 3 results) No results for input(s): BNP in the last 8760 hours. Basic Metabolic Panel: Recent Labs  Lab 09/14/21 0953 09/14/21 1928 09/15/21 0415  NA 135  --  137  K 3.6  --  3.2*  CL 104  --  103  CO2 22  --  24  GLUCOSE 140*  --  129*  BUN 44*  --  34*  CREATININE 1.75* 1.39* 1.35*  CALCIUM 8.7*  --  8.2*   Liver Function Tests: Recent Labs  Lab 09/14/21 0953 09/15/21 0415  AST 37 37  ALT 17 22  ALKPHOS 63 58  BILITOT 0.8 1.0  PROT 8.0 7.0  ALBUMIN 3.6 2.8*   Recent Labs  Lab 09/14/21 0953  LIPASE 29   No  results for input(s): AMMONIA in the last 168 hours. CBC: Recent Labs  Lab 09/14/21 0953 09/14/21 1928 09/15/21 0415  WBC 15.3* 14.7* 11.8*  HGB 11.4* 11.5* 10.9*  HCT 36.2* 36.2* 33.9*  MCV  90.0 87.9 87.4  PLT 293 313 278   Cardiac Enzymes: Recent Labs  Lab 09/14/21 0953  CKTOTAL 156   BNP: Invalid input(s): POCBNP CBG: No results for input(s): GLUCAP in the last 168 hours. D-Dimer No results for input(s): DDIMER in the last 72 hours. Hgb A1c No results for input(s): HGBA1C in the last 72 hours. Lipid Profile No results for input(s): CHOL, HDL, LDLCALC, TRIG, CHOLHDL, LDLDIRECT in the last 72 hours. Thyroid function studies No results for input(s): TSH, T4TOTAL, T3FREE, THYROIDAB in the last 72 hours.  Invalid input(s): FREET3 Anemia work up No results for input(s): VITAMINB12, FOLATE, FERRITIN, TIBC, IRON, RETICCTPCT in the last 72 hours. Urinalysis    Component Value Date/Time   COLORURINE YELLOW (A) 09/14/2021 0953   APPEARANCEUR HAZY (A) 09/14/2021 0953   LABSPEC 1.018 09/14/2021 0953   PHURINE 5.0 09/14/2021 0953   GLUCOSEU NEGATIVE 09/14/2021 0953   HGBUR MODERATE (A) 09/14/2021 0953   BILIRUBINUR NEGATIVE 09/14/2021 0953   KETONESUR NEGATIVE 09/14/2021 0953   PROTEINUR 30 (A) 09/14/2021 0953   NITRITE NEGATIVE 09/14/2021 0953   LEUKOCYTESUR NEGATIVE 09/14/2021 0953   Sepsis Labs Invalid input(s): PROCALCITONIN,  WBC,  LACTICIDVEN Microbiology Recent Results (from the past 240 hour(s))  Respiratory (~20 pathogens) panel by PCR     Status: None   Collection Time: 09/14/21  7:28 PM   Specimen: Nasopharyngeal Swab; Respiratory  Result Value Ref Range Status   Adenovirus NOT DETECTED NOT DETECTED Final   Coronavirus 229E NOT DETECTED NOT DETECTED Final    Comment: (NOTE) The Coronavirus on the Respiratory Panel, DOES NOT test for the novel  Coronavirus (2019 nCoV)    Coronavirus HKU1 NOT DETECTED NOT DETECTED Final   Coronavirus NL63 NOT DETECTED  NOT DETECTED Final   Coronavirus OC43 NOT DETECTED NOT DETECTED Final   Metapneumovirus NOT DETECTED NOT DETECTED Final   Rhinovirus / Enterovirus NOT DETECTED NOT DETECTED Final   Influenza A NOT DETECTED NOT DETECTED Final   Influenza B NOT DETECTED NOT DETECTED Final   Parainfluenza Virus 1 NOT DETECTED NOT DETECTED Final   Parainfluenza Virus 2 NOT DETECTED NOT DETECTED Final   Parainfluenza Virus 3 NOT DETECTED NOT DETECTED Final   Parainfluenza Virus 4 NOT DETECTED NOT DETECTED Final   Respiratory Syncytial Virus NOT DETECTED NOT DETECTED Final   Bordetella pertussis NOT DETECTED NOT DETECTED Final   Bordetella Parapertussis NOT DETECTED NOT DETECTED Final   Chlamydophila pneumoniae NOT DETECTED NOT DETECTED Final   Mycoplasma pneumoniae NOT DETECTED NOT DETECTED Final    Comment: Performed at [facility], [place]. [address] [place], [place] [address]   Imaging DG Chest 2 View  Result Date: 09/14/2021 CLINICAL DATA:  81 year old male with history of lower back and shoulder pain. Evaluate for infiltrate. EXAM: CHEST - 2 VIEW COMPARISON:  No priors. FINDINGS: Lung volumes are low. Ill-defined opacity in the left lung base which may reflect atelectasis and/or consolidation. No pleural effusions. No pneumothorax. No pulmonary nodule or mass noted. Pulmonary vasculature and the cardiomediastinal silhouette are within normal limits. Atherosclerotic calcifications in the thoracic aorta. IMPRESSION: 1. Low lung volumes with ill-defined opacity in the left lung base, which may reflect areas of atelectasis and/or consolidation. 2. Aortic atherosclerosis. Electronically Signed   By: Vinnie Langton M.D.   On: 09/14/2021 12:35   CT HEAD WO CONTRAST (5MM)  Result Date: 09/14/2021 CLINICAL DATA:  Altered mental status. EXAM: CT HEAD WITHOUT CONTRAST TECHNIQUE: Contiguous axial images were obtained from the  base of the skull through the vertex without intravenous contrast. RADIATION DOSE  REDUCTION: This exam was performed according to the departmental dose-optimization program which includes automated exposure control, adjustment of the mA and/or kV according to patient size and/or use of iterative reconstruction technique. COMPARISON:  None Available. FINDINGS: Brain: No evidence of intracranial hemorrhage, acute infarction, hydrocephalus, extra-axial collection, or mass lesion/mass effect. Moderate chronic small vessel disease is noted. Vascular:  No hyperdense vessel or other acute findings. Skull: No evidence of fracture or other significant bone abnormality. Sinuses/Orbits:  No acute findings. Other: None. IMPRESSION: No acute intracranial abnormality. Moderate chronic small vessel disease. Electronically Signed   By: Marlaine Hind M.D.   On: 09/14/2021 16:04   MR BRAIN WO CONTRAST  Result Date: 09/14/2021 CLINICAL DATA:  Neuro deficit, acute, stroke suspected EXAM: MRI HEAD WITHOUT CONTRAST TECHNIQUE: Multiplanar, multiecho pulse sequences of the brain and surrounding structures were obtained without intravenous contrast. COMPARISON:  CT head from the same day. FINDINGS: Brain: No acute infarction, hemorrhage, hydrocephalus, extra-axial collection or mass lesion. Moderate patchy and confluent T2/FLAIR hyperintensities in the white matter, nonspecific but compatible with chronic microvascular ischemic disease. The small benign dilated perivascular spaces within the basal ganglia bilaterally. Remote small lacunar infarcts in bilateral cerebellum. Vascular: Major arterial flow voids are maintained at the skull base. Skull and upper cervical spine: Normal marrow signal. Sinuses/Orbits: Clear sinuses.  No acute orbital findings. Other: No mastoid effusions. IMPRESSION: 1. No evidence of acute intracranial abnormality. 2. Moderate chronic microvascular ischemic disease. Electronically Signed   By: Margaretha Sheffield M.D.   On: 09/14/2021 19:05   MR Cervical Spine Wo Contrast  Result Date:  09/14/2021 CLINICAL DATA:  Ataxia, nontraumatic, cervical pathology suspected EXAM: MRI CERVICAL SPINE WITHOUT CONTRAST TECHNIQUE: Multiplanar, multisequence MR imaging of the cervical spine was performed. No intravenous contrast was administered. COMPARISON:  None Available. FINDINGS: Alignment: Straightening of the cervical lordosis with grade 1 anterolisthesis of C7 on T1. Vertebrae: No fracture, evidence of discitis, or bone lesion. Multilevel discogenic endplate marrow changes and anterior endplate osteophytosis. Cord: Normal signal and morphology. Posterior Fossa, vertebral arteries, paraspinal tissues: Negative. Disc levels: C2-C3: Disc osteophyte complex with central protrusion. Left greater than right facet arthropathy. Mild canal stenosis. No significant foraminal stenosis. C3-C4: Disc osteophyte complex with central protrusion. Bilateral facet arthropathy and uncovertebral spurring. Mild canal stenosis with mild bilateral foraminal stenosis. C4-C5: Disc osteophyte complex with mild bilateral facet and uncovertebral spurring. Moderate canal stenosis with moderate bilateral foraminal stenosis. C5-C6: Disc osteophyte complex with right paracentral protrusion. Bilateral facet and uncovertebral arthropathy. Moderate-severe canal stenosis with severe right and moderate left foraminal stenosis. C6-C7: Disc osteophyte complex, eccentric to the right. Bilateral facet and uncovertebral arthropathy. Moderate canal stenosis with mild-to-moderate bilateral foraminal stenosis. C7-T1: Disc uncovering without focal protrusion. Mild bilateral facet arthropathy. No foraminal or canal stenosis. IMPRESSION: 1. 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 2. Multilevel bilateral foraminal stenosis, severe on the right at C5-6. Electronically Signed   By: Davina Poke D.O.   On: 09/14/2021 17:34   MR THORACIC SPINE WO  CONTRAST  Result Date: 09/14/2021 CLINICAL DATA:  Back trauma. Abnormal neuro exam. Lower leg weakness. EXAM: MRI THORACIC SPINE WITHOUT CONTRAST TECHNIQUE: Multiplanar, multisequence MR imaging of the thoracic spine was performed. No intravenous contrast was administered. COMPARISON:  Same day CT chest, abdomen, and pelvis 09/14/2021 FINDINGS: Alignment:  Minimal 2 mm grade 1 anterolisthesis of C7  on T1. Vertebrae: Vertebral body heights are maintained. Severe T8-9 and T9-10 disc space narrowing. Severe partially visualized C5-6, severe moderate C6-7, and mild C7-T1 disc space narrowing. Decreased disc hydration is greatest at C5-6 through T2-3 and T7-8 through T10-11. Mild-to-moderate C6-7 and mild C7-T1, mild T2-3, mild T5-6, mild T6-7 chronic fat intensity marrow endplate degenerative changes. No acute fracture or destructive bone lesion. Cord: Normal signal and morphology of the thoracic cord. The conus terminates at the superior L1 level. Paraspinal and other soft tissues: The kidneys are partially visualized. There are partially visualized cysts within the bilateral kidneys, measuring at least 6 cm on the left. Disc levels: Please see contemporaneous MRI of the cervical spine for evaluation of the lower cervical spine levels. T1-2: Mild broad-based posterior disc bulge. Mild-to-moderate bilateral facet joint hypertrophy. Mild right neuroforaminal stenosis. T9-10: Mild bilateral facet joint hypertrophy. Minimal posterior disc bulge. Mild narrowing of the left-greater-than-right lateral recesses. Borderline mild bilateral neuroforaminal stenosis. T10-11: Mild-to-moderate bilateral facet joint hypertrophy. Midline posterior annular disc tear. Minimal posterior disc bulge. Mild-to-moderate bilateral neuroforaminal stenosis. No central canal stenosis. T11-12: Mild bilateral facet joint hypertrophy. Minimal posterior disc bulge. Mild bilateral neuroforaminal stenosis. No central canal stenosis. T12-L1: Right  paracentral posterior disc protrusion measures up to 8 mm in AP dimension and contributes to severe right lateral recess stenosis. Mild-to-moderate bilateral facet joint hypertrophy. Moderate right and mild left neuroforaminal stenosis. Overall mild central canal stenosis. IMPRESSION: Multilevel degenerative disc and joint changes as above. Mild-to-moderate bilateral T10-11 neuroforaminal stenosis. T12-L1 right paracentral posterior disc protrusion contributes to severe right lateral recess stenosis and mild central canal stenosis. Moderate right and mild left neuroforaminal stenosis. Electronically Signed   By: Yvonne Kendall M.D.   On: 09/14/2021 21:36   MR LUMBAR SPINE WO CONTRAST  Result Date: 09/14/2021 CLINICAL DATA:  Lower back pain. Cauda equina syndrome suspected. Lower leg weakness. EXAM: MRI LUMBAR SPINE WITHOUT CONTRAST TECHNIQUE: Multiplanar, multisequence MR imaging of the lumbar spine was performed. No intravenous contrast was administered. COMPARISON:  CT chest, abdomen and pelvis earlier same day 09/14/2021 FINDINGS: Segmentation:  Standard. Alignment: 4 mm grade 1 anterolisthesis of L4 on L5. Mild levocurvature centered at L2-3. Vertebrae: Vertebral body heights are maintained. Scattered vertebral body fat intensity hemangiomas. Mild L2-3, severe posterior L3-4: Mild posterior L4-5 disc space narrowing. Mild-to-moderate chronic fat intensity marrow endplate degenerative changes at the posterior L3-4 level. No acute fracture. Conus medullaris and cauda equina: Conus extends to the superior L1 level. Conus and cauda equina appear normal. Paraspinal and other soft tissues: The kidneys are partially visualized. There are decreased T1 increased T2 signal cysts within the left-greater-than-right kidneys. No abdominal aortic aneurysm. Disc levels: T12-L1: Right paracentral posterior disc protrusion measures up to 8 mm in AP dimension and contributes to severe right lateral recess stenosis.  Mild-to-moderate bilateral facet joint hypertrophy. Moderate right and mild left neuroforaminal stenosis. Overall mild central canal stenosis. L1-2: Mild bilateral facet joint hypertrophy. No posterior disc bulge central canal narrowing, or neuroforaminal stenosis. L2-3: Mild-to-moderate bilateral facet joint hypertrophy. Mild broad-based posterior disc osteophyte complex. Mild right neuroforaminal stenosis. Mild narrowing of the lateral recesses and borderline mild central canal stenosis. L3-4: Mild-to-moderate bilateral facet hypertrophy. Moderate broad-based posterior disc osteophyte complex with moderate bilateral intraforaminal extension. Moderate bilateral neuroforaminal stenosis. Moderate narrowing of the lateral recesses and moderate central canal stenosis. L4-5: Severe bilateral facet joint hypertrophy. Mild right facet joint effusion. Posterior disc uncovering with minimal posterior disc bulge. Moderate bilateral neuroforaminal stenosis. Severe narrowing  of the lateral recesses and severe central canal stenosis. L5-S1: Severe left-greater-than-right facet joint hypertrophy. Small left intraforaminal annular disc tear. No significant posterior disc bulge. No significant neuroforaminal stenosis. No central canal stenosis. IMPRESSION: 1. Multilevel degenerative disc and joint changes as above. Grade 1 anterolisthesis of L4 on L5. 2. Moderate L3-4 and severe L4-5 central canal stenosis. 3. Multilevel neuroforaminal stenosis including moderate bilateral L3-4 and moderate bilateral L4-5 neuroforaminal stenosis. Electronically Signed   By: Yvonne Kendall M.D.   On: 09/14/2021 21:44   CT CHEST ABDOMEN PELVIS W CONTRAST  Result Date: 09/14/2021 CLINICAL DATA:  Weakness, abdominal pain and shoulder pain. Clinical concern for lung infiltrate, diverticulitis, small bowel obstruction or mass. EXAM: CT CHEST, ABDOMEN, AND PELVIS WITH CONTRAST TECHNIQUE: Multidetector CT imaging of the chest, abdomen and pelvis was  performed following the standard protocol during bolus administration of intravenous contrast. RADIATION DOSE REDUCTION: This exam was performed according to the departmental dose-optimization program which includes automated exposure control, adjustment of the mA and/or kV according to patient size and/or use of iterative reconstruction technique. CONTRAST:  10m OMNIPAQUE IOHEXOL 300 MG/ML  SOLN COMPARISON:  Chest radiographs obtained earlier today. Renal ultrasound dated 12/06/2013. Abdomen and pelvis CT dated 04/15/2020. FINDINGS: CT CHEST FINDINGS Cardiovascular: Atheromatous calcifications, including the coronary arteries and aorta. Normal sized heart. Mediastinum/Nodes: No enlarged mediastinal, hilar, or axillary lymph nodes. Thyroid gland, trachea, and esophagus demonstrate no significant findings. Lungs/Pleura: Bilateral dependent atelectasis.  No pleural fluid. Musculoskeletal: Thoracic and lower cervical spine degenerative changes. Mild bilateral gynecomastia. CT ABDOMEN PELVIS FINDINGS Hepatobiliary: Normal appearing gallbladder. Stable right lobe liver probable cyst. Pancreas: Unremarkable. No pancreatic ductal dilatation or surrounding inflammatory changes. Spleen: Normal in size without focal abnormality. Adrenals/Urinary Tract: Stable mild bilateral adrenal hyperplasia. Stable bilateral renal cysts. Unremarkable ureters and urinary bladder. Stomach/Bowel: Prominent stool in the right, proximal transverse and proximal descending colon. Multiple small sigmoid colon diverticula without evidence diverticulitis. Normal appearing appendix, small bowel and stomach. Vascular/Lymphatic: Atheromatous arterial calcifications without aneurysm. No enlarged lymph nodes. Reproductive: Moderately enlarged prostate gland containing 3 small surgical clips or radiation seeds. Other: No abdominal wall hernia or abnormality. No abdominopelvic ascites. Musculoskeletal: Left hip prosthesis. Lumbar spine degenerative  changes, including marked facet degenerative changes at the L4-5 and L5-S1 levels with associated grade 1 anterolisthesis at the L4-5 level. IMPRESSION: 1. No acute abnormality in the chest, abdomen or pelvis. 2.  Calcific coronary artery and aortic atherosclerosis. 3. Prominent stool in the right, proximal transverse and proximal descending colon. Electronically Signed   By: SClaudie ReveringM.D.   On: 09/14/2021 14:28      Time coordinating discharge: Over 30 minutes  SIGNED:  NEmeterio ReeveDO [facility]

## 2021-09-15 NOTE — TOC Progression Note (Signed)
Transition of Care [facility]) - Progression Note    Patient Details  Name: Mark [name] MRN: [id number] Date of Birth: [birth year]  Transition of Care [facility]) CM/SW [place], RN Phone Number: 09/15/2021, 9:00 AM  Clinical Narrative:     TOC to follow for needs, He was seen by Neuro and has multi level stenosis, has a history of Hip replacement and completed out patient pT, walks with a walker at baseline, from home with his wife, PT and OT to eval, may need to go to [facility] SNF,        Expected Discharge Plan and Services                                                 Social Determinants of Health (SDOH) Interventions    Readmission Risk Interventions     View : No data to display.

## 2021-09-15 NOTE — Assessment & Plan Note (Signed)
   Slight worsening of creatinine today.  -Give some gentle IV fluid  -Monitor renal function  -Avoid nephrotoxins

## 2021-09-15 NOTE — Assessment & Plan Note (Addendum)
   Likely d/t steroids

## 2021-09-15 NOTE — Assessment & Plan Note (Addendum)
Patient developed low-grade fever on the night of admission and remained afebrile since then, pt has no complaints as of rounds, no concerns for infection on UA, CXR was followed w/ CT chest and no consolidation, no other s/s infection at this time. WBC down-trending (up likely d/t steroids)   Continue to monitor and reassess as needed

## 2021-09-15 NOTE — Assessment & Plan Note (Signed)
   Cont home meds

## 2021-09-15 NOTE — NC FL2 (Signed)
[place] LEVEL OF CARE SCREENING TOOL     IDENTIFICATION  Patient Name: Mark [name] Birthdate: 1941-04-11 Sex: male Admission Date (Current Location): 09/14/2021  [facility] and [place] Number:  Engineering geologist and Address:         Provider Number: [id number]  Attending Physician Name and Address:  Emeterio Reeve, DO  Relative Name and Phone Number:  Lorenda Hatchet [phone]    Current Level of Care: Hospital Recommended Level of Care: [place] Prior Approval Number:    Date Approved/Denied:   PASRR Number: [id number]  Discharge Plan: SNF    Current Diagnoses: Patient Active Problem List   Diagnosis Date Noted   Weakness 09/14/2021   Hypertension 02/02/2016   Hypercholesterolemia 02/02/2016   Left shoulder pain 02/02/2016   Encounter for pre-employment health screening examination 02/02/2016   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    Orientation RESPIRATION BLADDER Height & Weight     Self, Time, Situation, Place  Normal Continent Weight: 87.5 kg Height:  '5\' 6"'$  (167.6 cm)  BEHAVIORAL SYMPTOMS/MOOD NEUROLOGICAL BOWEL NUTRITION STATUS      Continent Diet (see dc summary)  AMBULATORY STATUS COMMUNICATION OF NEEDS Skin   Extensive Assist Verbally Normal                       Personal Care Assistance Level of Assistance  Bathing, Dressing, Feeding Bathing Assistance: Maximum assistance Feeding assistance: Independent Dressing Assistance: Maximum assistance     Functional Limitations Info             SPECIAL CARE FACTORS FREQUENCY  PT (By licensed PT), OT (By licensed OT)     PT Frequency: 5 times per week OT Frequency: 5 times per week            Contractures Contractures Info: Not present    Additional Factors Info  Code Status, Allergies Code Status Info: full code Allergies Info: NKDA           Current Medications (09/15/2021):  This is the current hospital active  medication list Current Facility-Administered Medications  Medication Dose Route Frequency Provider Last Rate Last Admin   acetaminophen (TYLENOL) tablet 650 mg  650 mg Oral Q6H PRN Clance Boll, MD   650 mg at 09/15/21 0100   Or   acetaminophen (TYLENOL) suppository 650 mg  650 mg Rectal Q6H PRN Clance Boll, MD       albuterol (PROVENTIL) (2.5 MG/3ML) 0.083% nebulizer solution 2.5 mg  2.5 mg Nebulization Q2H PRN Myles Rosenthal A, MD       amLODipine (NORVASC) tablet 5 mg  5 mg Oral Daily Myles Rosenthal A, MD   5 mg at 09/15/21 0934   docusate sodium (COLACE) capsule 100 mg  100 mg Oral Daily PRN Clance Boll, MD       enoxaparin (LOVENOX) injection 47.5 mg  0.5 mg/kg Subcutaneous Q24H Myles Rosenthal A, MD   47.5 mg at 09/14/21 2304   hydrALAZINE (APRESOLINE) tablet 20 mg  20 mg Oral TID Myles Rosenthal A, MD   20 mg at 09/15/21 0934   ondansetron (ZOFRAN) tablet 4 mg  4 mg Oral Q6H PRN Clance Boll, MD       Or   ondansetron [facility]) injection 4 mg  4 mg Intravenous Q6H PRN Clance Boll, MD       rosuvastatin (CRESTOR) tablet 20 mg  20 mg Oral Daily Clance Boll, MD  20 mg at 09/15/21 0934   terazosin (HYTRIN) capsule 5 mg  5 mg Oral QHS Clance Boll, MD   5 mg at 09/14/21 2253     Discharge Medications: Please see discharge summary for a list of discharge medications.  Relevant Imaging Results:  Relevant Lab Results:   Additional Information SS# [id number]  Conception Oms, RN

## 2021-09-15 NOTE — Evaluation (Signed)
Physical Therapy Evaluation Patient Details Name: Mark [name] MRN: [id number] DOB: 05-27-1940 Today's Date: 09/15/2021  History of Present Illness  Patient is a 81 year old male who presented to [facility] with a chief complaint of RLE weakness. 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   Clinical Impression  Physical Therapy Evaluation completed this date. Patient tolerated session poorly due to increased pain. 0/10 pain at rest, however 10/10 pain with functional activity. Patient states prior to coming to the hospital he was in outpatient physical therapy at [facility] for a L THR. Weakness has been reported for a couple of days, and the wife only has helped him with getting out of bed. At baseline patient was Mod I/ Independent ambulating with a RW and/or SPC. Patient lives in a 1 story home with 1 STE and no hand rails. Wife still works so is only able to assist PRN.   Throughout evaluation, patient demonstrated decreased strength, balance, coordination, and increased sensitivity to light touch, decreasing patient's ability to perform ADLs safely. In supine, patient demonstrated intact sensation in BUE and BLE, however with light touch patient reported increased pain. RUE reported 3/5 strength and LUE 0/5 strength. Decreased PROM with LUE noted and increased pain with movement. BUE grip strength however intact. In BLEs patient is unable to complete a SLR however muscle contraction noted (1/5) however PF strength bilaterally a 4/5. +2 MAX A for all bed mobility with increased rigidity and pain noted with movement. Upon sitting EOB, Min A  and RUE unilateral support required to remain in sitting. Within the transition to sitting author noted patient's LUE was supporting weight for a brief period of time. Patient unable to anteriorly scoot forward for feet to be firmly planted on the floor. Patient was left in bed with all needs met and  in reach with family present. Patient would continue to benefit from skilled physical therapy in order to optimize patient's return to PLOF. Recommend STE upon discharge from acute hospitalization.      Recommendations for follow up therapy are one component of a multi-disciplinary discharge planning process, led by the attending physician.  Recommendations may be updated based on patient status, additional functional criteria and insurance authorization.  Follow Up Recommendations Skilled nursing-short term rehab (<3 hours/day)    Assistance Recommended at Discharge Frequent or constant Supervision/Assistance  Patient can return home with the following  Two people to help with walking and/or transfers;Two people to help with bathing/dressing/bathroom;Help with stairs or ramp for entrance;Assist for transportation    Equipment Recommendations Other (comment) (TBD)  Recommendations for Other Services       Functional Status Assessment Patient has had a recent decline in their functional status and demonstrates the ability to make significant improvements in function in a reasonable and predictable amount of time.     Precautions / Restrictions Precautions Precautions: Fall Restrictions Weight Bearing Restrictions: No      Mobility  Bed Mobility Overal bed mobility: Needs Assistance Bed Mobility: Supine to Sit, Sit to Supine     Supine to sit: Max assist, +2 for safety/equipment, HOB elevated (significant increase in pain, increased rigidity noted with movement) Sit to supine: Max assist, +2 for physical assistance   General bed mobility comments: MAX A +2 to reposition in bed Patient Response: Flat affect, Cooperative  Transfers                   General transfer  comment: deferred for safety, patient unable to complete at this time    Ambulation/Gait Ambulation/Gait assistance:  (deferred for safety, patient unable to complete at this time)                 Science writer    Modified Rankin (Stroke Patients Only)       Balance Overall balance assessment: Needs assistance Sitting-balance support: Single extremity supported, Feet unsupported Sitting balance-Leahy Scale: Poor Sitting balance - Comments: With UUE support SUP, with no UE support Min A     Standing balance-Leahy Scale: Zero                               Pertinent Vitals/Pain Pain Assessment Pain Assessment: No/denies pain    Home Living Family/patient expects to be discharged to:: Private residence Living Arrangements: Spouse/significant other Available Help at Discharge: Family;Available PRN/intermittently (wife still works) Type of Home: House Home Access: Stairs to enter Entrance Stairs-Rails: None Technical brewer of Steps: 1   Home Layout: One level Home Equipment: Grab bars - tub/shower;Rolling Environmental consultant (2 wheels);Cane - single point      Prior Function Prior Level of Function : Independent/Modified Independent             Mobility Comments: No AD at baseline patient required assistance with getting out of bed from wife ADLs Comments: Independent/Mod I     Hand Dominance   Dominant Hand: Right    Extremity/Trunk Assessment   Upper Extremity Assessment Upper Extremity Assessment: Generalized weakness;RUE deficits/detail;LUE deficits/detail RUE Deficits / Details: at least 3/5 strength RUE Sensation: WNL RUE Coordination: decreased fine motor;decreased gross motor LUE Deficits / Details: mildly decreased grip strength (same as R grip strength), patient unable to actively move LUE (shoulder flexion, elbow flexion) increased pain with PROM, decreased PROM range of motion LUE: Shoulder pain at rest (shoulder pain with light touch) LUE Sensation: WNL    Lower Extremity Assessment Lower Extremity Assessment: Generalized weakness (4/5 PF strength bilaterally, patient unable to perform SLR  bilaterally, muscle activation noted however no movement (1/5 bilaterally))       Communication   Communication: No difficulties  Cognition Arousal/Alertness: Awake/alert Behavior During Therapy: WFL for tasks assessed/performed Overall Cognitive Status: Within Functional Limits for tasks assessed                                 General Comments: A&Ox3 self location and situation        General Comments      Exercises Other Exercises Other Exercises: Patient educated on role of PT in acute care setting, fall risk, and d/c recommendations   Assessment/Plan    PT Assessment Patient needs continued PT services  PT Problem List Decreased strength;Decreased mobility;Impaired tone;Pain;Decreased balance;Decreased activity tolerance;Decreased range of motion;Decreased coordination       PT Treatment Interventions DME instruction;Therapeutic activities;Therapeutic exercise;Gait training;Stair training;Balance training;Functional mobility training    PT Goals (Current goals can be found in the Care Plan section)  Acute Rehab PT Goals Patient Stated Goal: to get better PT Goal Formulation: With patient Time For Goal Achievement: 09/29/21 Potential to Achieve Goals: Fair    Frequency Min 2X/week     Co-evaluation               AM-PAC PT "6 Clicks" Mobility  Outcome Measure Help needed turning from your back to your side while in a flat bed without using bedrails?: A Lot Help needed moving from lying on your back to sitting on the side of a flat bed without using bedrails?: A Lot Help needed moving to and from a bed to a chair (including a wheelchair)?: Total Help needed standing up from a chair using your arms (e.g., wheelchair or bedside chair)?: Total Help needed to walk in hospital room?: Total Help needed climbing 3-5 steps with a railing? : Total 6 Click Score: 8    End of Session Equipment Utilized During Treatment: Other (comment) (patient  remained bed level) Activity Tolerance: Patient limited by pain Patient left: in bed Nurse Communication: Mobility status PT Visit Diagnosis: Unsteadiness on feet (R26.81);Muscle weakness (generalized) (M62.81);Other symptoms and signs involving the nervous system (R29.898);Pain Pain - Right/Left:  (bilaterally)    Time: 9774-1423 PT Time Calculation (min) (ACUTE ONLY): 30 min   Charges:   PT Evaluation $PT Eval Moderate Complexity: 1 Mod PT Treatments $Therapeutic Activity: 8-22 mins      Iva Boop, PT  09/15/21. 9:43 AM

## 2021-09-15 NOTE — Plan of Care (Signed)

## 2021-09-16 DIAGNOSIS — E876 Hypokalemia: Secondary | ICD-10-CM

## 2021-09-16 DIAGNOSIS — M4802 Spinal stenosis, cervical region: Secondary | ICD-10-CM | POA: Diagnosis not present

## 2021-09-16 LAB — MAGNESIUM: Magnesium: 2.2 mg/dL (ref 1.7–2.4)

## 2021-09-16 LAB — BASIC METABOLIC PANEL
Anion gap: 8 (ref 5–15)
BUN: 46 mg/dL — ABNORMAL HIGH (ref 8–23)
CO2: 27 mmol/L (ref 22–32)
Calcium: 8.1 mg/dL — ABNORMAL LOW (ref 8.9–10.3)
Chloride: 96 mmol/L — ABNORMAL LOW (ref 98–111)
Creatinine, Ser: 1.8 mg/dL — ABNORMAL HIGH (ref 0.61–1.24)
GFR, Estimated: 38 mL/min — ABNORMAL LOW (ref >60)
Glucose, Bld: 126 mg/dL — ABNORMAL HIGH (ref 70–99)
Potassium: 3 mmol/L — ABNORMAL LOW (ref 3.5–5.1)
Sodium: 131 mmol/L — ABNORMAL LOW (ref 135–145)

## 2021-09-16 MED ORDER — ENOXAPARIN SODIUM 60 MG/0.6ML IJ SOSY
0.5000 mg/kg | PREFILLED_SYRINGE | INTRAMUSCULAR | Status: DC
  Administered 2021-09-16 – 2021-09-17 (×2): 45 mg via SUBCUTANEOUS
  Filled 2021-09-16 (×2): qty 0.6

## 2021-09-16 MED ORDER — POTASSIUM CHLORIDE CRYS ER 20 MEQ PO TBCR
20.0000 meq | EXTENDED_RELEASE_TABLET | Freq: Once | ORAL | Status: AC
Start: 2021-09-16 — End: 2021-09-16
  Administered 2021-09-16: 20 meq via ORAL
  Filled 2021-09-16: qty 1

## 2021-09-16 MED ORDER — BISACODYL 10 MG RE SUPP
10.0000 mg | Freq: Once | RECTAL | Status: AC
  Administered 2021-09-16: 10 mg via RECTAL
  Filled 2021-09-16: qty 1

## 2021-09-16 MED ORDER — SODIUM CHLORIDE 0.9 % IV SOLN
INTRAVENOUS | Status: AC
Start: 2021-09-16 — End: 2021-09-17

## 2021-09-16 NOTE — Plan of Care (Signed)

## 2021-09-16 NOTE — TOC Progression Note (Signed)
Transition of Care [facility]) - Progression Note    Patient Details  Name: BARD HAUPERT MRN: [id number] Date of Birth: [birth year]  [facility]) CM/SW [place], RN Phone Number: 09/16/2021, 2:27 PM  Clinical Narrative:   Ins auth approved  Certification ID: [id number].  Approved for sub level 1 for three days 5/31 - 6/2.  Next review due by 6/5.  Reviewer is Tanna Savoy, RN phone: [phone].  If needed fax to submit updates if using Vicente Males: [phone] or [phone] if not on [place].     Expected Discharge Plan: Skilled Nursing Facility Barriers to Discharge: Ship broker, SNF Pending bed offer, Continued Medical Work up  Expected Discharge Plan and Services Expected Discharge Plan: [place] arrangements for the past 2 months: Single Family Home                                       Social Determinants of Health (SDOH) Interventions    Readmission Risk Interventions     View : No data to display.

## 2021-09-16 NOTE — Assessment & Plan Note (Signed)
Potassium at 3 and magnesium of 2.2 -Replete potassium and monitor

## 2021-09-16 NOTE — Progress Notes (Signed)
PT Cancellation Note  Patient Details Name: Mark [name] MRN: [id number] DOB: 01-12-1941   Cancelled Treatment:     PT attempt. 2nd attempt today. First attempt, pt being cleaned/ hygiene care. 2nd attempt, IV team in room to start IV. Acute PT will continue to follow and progress as able per current POC.    Willette Pa 09/16/2021, 2:59 PM

## 2021-09-16 NOTE — Progress Notes (Signed)
Progress Note   Patient: Mark [name] ZOX:[id number] DOB: [birth year] DOA: 09/14/2021     2 DOS: the patient was seen and examined on 09/16/2021   Brief hospital course: Taken from prior notes.  Mr. Wavra is a 81 year old male, PMH HTN, HLD, CKD 3, prostate CA s/p radiation, lumbar spine and bilateral hip arthritis, status post hip replacement, chronic debility followed by outpatient PT T.  Presents to ED 09/14/2021, pelvic/hip pain, lower extremity weakness, dizziness.  Recent ER visit to [facility] for hip pain, treated for arthritis with Toradol and prednisone, discharged home.  Since then, decreased mobility, increased pain, wife noted fall this morning, patient was reporting increased weakness and difficulty ambulating.  EMS assisted him to chair but still unable to move from then, EMS called again and brought to ED.  Prior baseline ambulatory with walker 4 days prior to arrival.  Wife also reported episode shaking in sleep and mild confusion but this had since resolved.  ED course, no significant concerns on vital signs, WBC slight elevation at 15.3.  CT head no concerns.  MRI head/cervical spine advanced multilevel cervical spondylosis, moderate to severe canal stenosis at C5-C6, moderate canal stenosis C4-C5 and C6-C7, multilevel bilateral foraminal stenosis severe on right C5-6.  CT chest noted coronary artery and aortic atherosclerosis.  Given imaging findings as well as acute on chronic debility/weakness neurosurgery consult was obtained, patient declined any surgical procedure or other invasive intervention.  PT/OT recommend SNF for rehab.  5/31: Remained medically stable, continue to have bilateral shoulder pain. Received insurance authorization in the afternoon, most likely will be discharged tomorrow.    Assessment and Plan: * Stenosis of cervical spine region Taken from neurosurgery note Dr. Tamala Julian 09/14/21: "multilevel cervical stenosis without clear signal change, and a  right-sided paramedian T12-L1 disc herniation.  We discussed his findings with him that some of his weakness could perhaps be coming from his disc herniation, however the severity of weakness and knee extension and hip flexion with the absence of any weakness in obturator function would argue against a radicular problem.  This is at the level of the conus which can sometimes make these symptoms difficult to discern.  He does have a history of prostate cancer and radiation so could potentially be a delayed radiation effect with femoral predominance.  However, the patient clearly stated that he would not want any surgery on his back even if it meant that he would have prolonged weakness in his lower extremity and that this was potentially salvageable versus reversible."  Supportive pain meds  PT/OT recommend SNF rehab --> got insurance authorization  Hypertension  Continue home meds  Hypokalemia Potassium at 3 and magnesium of 2.2 -Replete potassium and monitor  Hypercholesterolemia  Cont home meds  AKI (acute kidney injury) ([place])  Slight worsening of creatinine today.  -Give some gentle IV fluid  -Monitor renal function  -Avoid nephrotoxins  Leukocytosis  Likely d/t steroids   Fever Patient developed low-grade fever on the night of admission and remained afebrile since then, pt has no complaints as of rounds, no concerns for infection on UA, CXR was followed w/ CT chest and no consolidation, no other s/s infection at this time. WBC down-trending (up likely d/t steroids)   Continue to monitor and reassess as needed      Subjective: Patient continued to have bilateral shoulder pain, little more on left.  Pain increases with mobility and improves with rest.  Physical Exam: Vitals:   09/15/21 1943 09/16/21 0551  09/16/21 0800 09/16/21 1701  BP: 129/68 (!) 103/49 (!) 133/56 136/65  Pulse: 67 67 64 77  Resp: '16 17 18   '$ Temp: 98.5 F (36.9 C) 98.5 F (36.9 C) 99.1 F (37.3 C)  99.5 F (37.5 C)  TempSrc:   Oral   SpO2: 98% 95% 97% 96%  Weight:      Height:       General.     In no acute distress. Pulmonary.  Lungs clear bilaterally, normal respiratory effort. CV.  Regular rate and rhythm, no JVD, rub or murmur. Abdomen.  Soft, nontender, nondistended, BS positive. CNS.  Alert and oriented .  No focal neurologic deficit. Extremities.  No edema, no cyanosis, pulses intact and symmetrical. Psychiatry.  Judgment and insight appears normal.  Data Reviewed: Prior notes, labs and images reviewed  Family Communication:   Disposition: Status is: Inpatient Remains inpatient appropriate because: Waiting for rehab.  Received insurance authorization today, will be discharged tomorrow   Planned Discharge Destination: Skilled nursing facility  Time spent: 45 minutes  This record has been created using Systems analyst. Errors have been sought and corrected,but may not always be located. Such creation errors do not reflect on the standard of care.  Author: Lorella Nimrod, MD 09/16/2021 6:07 PM  For on call review [email or web address].

## 2021-09-16 NOTE — Hospital Course (Signed)
Taken from prior notes.  Mark [name] is a 81 year old male, PMH HTN, HLD, CKD 3, prostate CA s/p radiation, lumbar spine and bilateral hip arthritis, status post hip replacement, chronic debility followed by outpatient PT Mark [name].  Presents to ED 09/14/2021, pelvic/hip pain, lower extremity weakness, dizziness.  Recent ER visit to [facility] for hip pain, treated for arthritis with Toradol and prednisone, discharged home.  Since then, decreased mobility, increased pain, wife noted fall this morning, patient was reporting increased weakness and difficulty ambulating.  EMS assisted him to chair but still unable to move from then, EMS called again and brought to ED.  Prior baseline ambulatory with walker 4 days prior to arrival.  Wife also reported episode shaking in sleep and mild confusion but this had since resolved.  ED course, no significant concerns on vital signs, WBC slight elevation at 15.3.  CT head no concerns.  MRI head/cervical spine advanced multilevel cervical spondylosis, moderate to severe canal stenosis at C5-C6, moderate canal stenosis C4-C5 and C6-C7, multilevel bilateral foraminal stenosis severe on right C5-6.  CT chest noted coronary artery and aortic atherosclerosis.  Given imaging findings as well as acute on chronic debility/weakness neurosurgery consult was obtained, patient declined any surgical procedure or other invasive intervention.  PT/OT recommend SNF for rehab.  5/31: Remained medically stable, continue to have bilateral shoulder pain. Received insurance authorization in the afternoon, most likely will be discharged tomorrow.  6/1: Creatinine with mild worsening.  Patient did receive contrast on 09/14/2021.  Most likely contrast induced.  Renal ultrasound with no hydronephrosis and bilateral stable renal cyst. Nephrology was consulted. DG left shoulder was also ordered for complaint of worsening left shoulder pain.  6/2: Renal function started improving and creatinine was 1.48 with  GFR of 48 on discharge. Left shoulder x-ray with extensive arthritis, no fracture or dislocation.  Patient otherwise remained stable and ready to be discharged to rehab. Patient will need a repeat BMP on Monday.  He will continue with current medications and follow-up with his providers.

## 2021-09-16 NOTE — TOC Progression Note (Addendum)
Transition of Care [facility]) - Progression Note    Patient Details  Name: Mark [name] MRN: [id number] Date of Birth: 06/10/1940  Transition of Care [facility]) CM/SW [place], RN Phone Number: 09/16/2021, 8:51 AM  Clinical Narrative:   The patient accepted the bed offer from [facility] in [place], He will need Ins approval, I notified Tammy at [facility] TOC started Holli Humbles process   Expected Discharge Plan: [place] Barriers to Discharge: Ship broker, SNF Pending bed offer, Continued Medical Work up  Expected Discharge Plan and Services Expected Discharge Plan: [place] arrangements for the past 2 months: Single Family Home                                       Social Determinants of Health (SDOH) Interventions    Readmission Risk Interventions     View : No data to display.

## 2021-09-16 NOTE — TOC Progression Note (Signed)
Transition of Care [facility]) - Progression Note    Patient Details  Name: Mark [name] MRN: [id number] Date of Birth: Jul 13, 1940  Transition of Care [facility]) CM/SW [place], RN Phone Number: 09/16/2021, 8:34 AM  Clinical Narrative:     The patient has only 1 bed offer, Expanded the bedsearch to get more options, Will review offers once obtained  Expected Discharge Plan: [place] Barriers to Discharge: Ship broker, SNF Pending bed offer, Continued Medical Work up  Expected Discharge Plan and Services Expected Discharge Plan: [place] arrangements for the past 2 months: Single Family Home                                       Social Determinants of Health (SDOH) Interventions    Readmission Risk Interventions     View : No data to display.

## 2021-09-17 ENCOUNTER — Inpatient Hospital Stay: Payer: No Typology Code available for payment source

## 2021-09-17 DIAGNOSIS — M4802 Spinal stenosis, cervical region: Secondary | ICD-10-CM | POA: Diagnosis not present

## 2021-09-17 LAB — BASIC METABOLIC PANEL
Anion gap: 8 (ref 5–15)
BUN: 48 mg/dL — ABNORMAL HIGH (ref 8–23)
CO2: 26 mmol/L (ref 22–32)
Calcium: 8 mg/dL — ABNORMAL LOW (ref 8.9–10.3)
Chloride: 102 mmol/L (ref 98–111)
Creatinine, Ser: 1.85 mg/dL — ABNORMAL HIGH (ref 0.61–1.24)
GFR, Estimated: 36 mL/min — ABNORMAL LOW (ref >60)
Glucose, Bld: 128 mg/dL — ABNORMAL HIGH (ref 70–99)
Potassium: 3.2 mmol/L — ABNORMAL LOW (ref 3.5–5.1)
Sodium: 136 mmol/L (ref 135–145)

## 2021-09-17 MED ORDER — LACTATED RINGERS IV SOLN: INTRAVENOUS | Status: AC

## 2021-09-17 MED ORDER — AMLODIPINE BESYLATE 10 MG PO TABS
10.0000 mg | ORAL_TABLET | Freq: Every day | ORAL | Status: DC
  Administered 2021-09-17 – 2021-09-18 (×2): 10 mg via ORAL
  Filled 2021-09-17 (×2): qty 1

## 2021-09-17 MED ORDER — POTASSIUM CHLORIDE CRYS ER 10 MEQ PO TBCR
30.0000 meq | EXTENDED_RELEASE_TABLET | Freq: Once | ORAL | Status: AC
  Administered 2021-09-17: 30 meq via ORAL
  Filled 2021-09-17: qty 1

## 2021-09-17 NOTE — Assessment & Plan Note (Signed)
Potassium at 3.2 and magnesium of 2.2 -Replete potassium and monitor

## 2021-09-17 NOTE — Consult Note (Signed)
[facility]  CONSULT NOTE    Date: 09/17/2021                  Patient Name:  Mark [name]  MRN: [id number]  DOB: 06/14/1940  Age / Sex: 81 y.o., male         PCP: [facility] [place]                 Service Requesting Consult: [place]                 Reason for Consult: Acute kidney injury            History of Present Illness: Mark [name] is a 81 y.o.  male with past medical conditions including hypertension, hyperlipidemia, prostate cancer with radiation, arthritis, and CKD stage III, who was admitted to [facility] on 09/14/2021 for Weakness [R53.1] Seizure-like activity [facility]) [R56.9]  Patient presents to the emergency department with complaints of weakness, dizziness, and pelvic/hip pain.  Patient was recently seen at [facility] for hip pain and was diagnosed with arthritis.  Patient was given Toradol and prednisone prior to being discharged home.  Patient and wife states mobility continue to decline with increased weakness and pain.  Denies nausea, vomiting, or diarrhea.  Denies shortness of breath or cough.  Denies no fever or chills.  Labs on admission include creatinine 1.75 with GFR 39.  Baseline appears to be 1.39 with GFR 52 in August 2022.  Patient received CT chest abdomen pelvis with contrast on 09/14/2021 that showed a stable mild bilateral adrenal hyperplasia with stable bilateral renal cyst, no obstruction.   Medications: Outpatient medications: Medications Prior to Admission  Medication Sig Dispense Refill Last Dose   amLODipine (NORVASC) 5 MG tablet Take 5 mg by mouth daily.   09/13/2021   docusate sodium (COLACE) 100 MG capsule Take 1 capsule (100 mg total) by mouth daily as needed. 30 capsule 2 Past Week   hydrALAZINE (APRESOLINE) 10 MG tablet Take 20 mg by mouth 3 (three) times daily.   09/13/2021   lisinopril (PRINIVIL,ZESTRIL) 40 MG tablet Take 1 tablet (40 mg total) by mouth daily. 90 tablet 0 09/13/2021   rosuvastatin (CRESTOR)  20 MG tablet Take 20 mg by mouth daily.   09/13/2021   terazosin (HYTRIN) 5 MG capsule Take 5 mg by mouth at bedtime.   09/13/2021   amLODipine (NORVASC) 10 MG tablet Take 1 tablet (10 mg total) by mouth daily. (Patient not taking: Reported on 09/14/2021) 90 tablet 0 Not Taking   lidocaine (XYLOCAINE) 5 % ointment Apply 1 application topically as needed for moderate pain.      predniSONE (DELTASONE) 20 MG tablet Take 1 tablet (20 mg total) by mouth daily. (Patient not taking: Reported on 09/14/2021) 2 tablet 0 Not Taking   [DISCONTINUED] HYDROcodone-acetaminophen (NORCO/VICODIN) 5-325 MG tablet Take 1 tablet by mouth every 4 (four) hours as needed for moderate pain. (Patient not taking: Reported on 09/14/2021) 20 tablet 0 Not Taking    Current medications: Current Facility-Administered Medications  Medication Dose Route Frequency Provider Last Rate Last Admin   acetaminophen (TYLENOL) tablet 650 mg  650 mg Oral Q6H PRN Myles Rosenthal A, MD   650 mg at 09/17/21 1001   Or   acetaminophen (TYLENOL) suppository 650 mg  650 mg Rectal Q6H PRN Clance Boll, MD       albuterol (PROVENTIL) (2.5 MG/3ML) 0.083% nebulizer solution 2.5 mg  2.5 mg Nebulization Q2H PRN  Clance Boll, MD       amLODipine (NORVASC) tablet 10 mg  10 mg Oral Daily Lorella Nimrod, MD   10 mg at 09/17/21 0949   docusate sodium (COLACE) capsule 100 mg  100 mg Oral Daily PRN Myles Rosenthal A, MD   100 mg at 09/16/21 0356   enoxaparin (LOVENOX) injection 45 mg  0.5 mg/kg Subcutaneous Q24H Chinita Greenland A, RPH   45 mg at 09/16/21 2234   hydrALAZINE (APRESOLINE) tablet 20 mg  20 mg Oral TID Clance Boll, MD   20 mg at 09/17/21 6606   lactated ringers infusion   Intravenous Continuous Lorella Nimrod, MD 100 mL/hr at 09/17/21 0850 New Bag at 09/17/21 0850   ondansetron (ZOFRAN) tablet 4 mg  4 mg Oral Q6H PRN Clance Boll, MD       Or   ondansetron [facility]) injection 4 mg  4 mg Intravenous Q6H PRN Clance Boll, MD       rosuvastatin (CRESTOR) tablet 20 mg  20 mg Oral Daily Myles Rosenthal A, MD   20 mg at 09/17/21 0949   terazosin (HYTRIN) capsule 5 mg  5 mg Oral QHS Myles Rosenthal A, MD   5 mg at 09/16/21 2233      Allergies: No Known Allergies    Past Medical History: Past Medical History:  Diagnosis Date   Arthritis    Lower Back, Hips, and Hands.   Cancer [facility])    Prostate CA s/o Rad Tx   Chronic kidney disease    Coronary artery disease    Hypercholesterolemia    Hypertension    Radiation 09/30/2008-12/02/2010   7800 cGy  Dr.Ottelin     Past Surgical History: Past Surgical History:  Procedure Laterality Date   CATARACT SURGERY     KNEE ARTHROSCOPY Left    QUADRICEPS TENDON REPAIR Right 12/15/2020   Procedure: REPAIR QUADRICEP TENDON;  Surgeon: Lovell Sheehan, MD;  Location: [facility] ORS;  Service: Orthopedics;  Laterality: Right;     Family History: Family History  Problem Relation Age of Onset   Hypertension Father    Healthy Sister      Social History: Social History   Socioeconomic History   Marital status: Married    Spouse name: Not on file   Number of children: Not on file   Years of education: Not on file   Highest education level: Not on file  Occupational History   Occupation: RETIRED    Employer: [employer]   Occupation: SELF-EMPLOYED    Comment: DIGITAL PHONE/SECURITY  Tobacco Use   Smoking status: Never   Smokeless tobacco: Never  Vaping Use   Vaping Use: Never used  Substance and Sexual Activity   Alcohol use: No   Drug use: No   Sexual activity: Never  Other Topics Concern   Not on file  Social History Narrative   Not on file   Social Determinants of Health   Financial Resource Strain: Not on file  Food Insecurity: Not on file  Transportation Needs: Not on file  Physical Activity: Not on file  Stress: Not on file  Social Connections: Not on file  Intimate Partner Violence: Not on file     Review of  Systems: Review of Systems  Constitutional:  Negative for chills, fever and malaise/fatigue.  HENT:  Negative for congestion, sore throat and tinnitus.   Eyes:  Negative for blurred vision and redness.  Respiratory:  Negative for cough, shortness of breath  and wheezing.   Cardiovascular:  Negative for chest pain, palpitations, claudication and leg swelling.  Gastrointestinal:  Negative for abdominal pain, blood in stool, diarrhea, nausea and vomiting.  Genitourinary:  Negative for flank pain, frequency and hematuria.  Musculoskeletal:  Positive for back pain. Negative for falls and myalgias.  Skin:  Negative for rash.  Neurological:  Positive for dizziness and weakness. Negative for headaches.  Endo/Heme/Allergies:  Does not bruise/bleed easily.  Psychiatric/Behavioral:  Negative for depression. The patient is not nervous/anxious and does not have insomnia.    Vital Signs: Blood pressure 127/61, pulse 75, temperature 98.3 F (36.8 C), resp. rate 16, height '5\' 6"'$  (1.676 m), weight 87.5 kg, SpO2 95 %.  Weight trends: Filed Weights   09/14/21 3614 09/14/21 2119  Weight: 93.9 kg 87.5 kg    Physical Exam: General: NAD  Head: Normocephalic, atraumatic. Moist oral mucosal membranes  Eyes: Anicteric  Lungs:  Clear to auscultation, normal effort  Heart: Regular rate and rhythm  Abdomen:  Soft, nontender  Extremities:  No peripheral edema.  Neurologic: Nonfocal, moving all four extremities  Skin: No lesions  Access: None     Lab results: Basic Metabolic Panel: Recent Labs  Lab 09/15/21 0415 09/16/21 0854 09/17/21 0434  NA 137 131* 136  K 3.2* 3.0* 3.2*  CL 103 96* 102  CO2 '24 27 26  '$ GLUCOSE 129* 126* 128*  BUN 34* 46* 48*  CREATININE 1.35* 1.80* 1.85*  CALCIUM 8.2* 8.1* 8.0*  MG  --  2.2  --     Liver Function Tests: Recent Labs  Lab 09/14/21 0953 09/15/21 0415  AST 37 37  ALT 17 22  ALKPHOS 63 58  BILITOT 0.8 1.0  PROT 8.0 7.0  ALBUMIN 3.6 2.8*   Recent  Labs  Lab 09/14/21 0953  LIPASE 29   No results for input(s): AMMONIA in the last 168 hours.  CBC: Recent Labs  Lab 09/14/21 0953 09/14/21 1928 09/15/21 0415  WBC 15.3* 14.7* 11.8*  HGB 11.4* 11.5* 10.9*  HCT 36.2* 36.2* 33.9*  MCV 90.0 87.9 87.4  PLT 293 313 278    Cardiac Enzymes: Recent Labs  Lab 09/14/21 0953  CKTOTAL 156    BNP: Invalid input(s): POCBNP  CBG: No results for input(s): GLUCAP in the last 168 hours.  Microbiology: Results for orders placed or performed during the hospital encounter of 09/14/21  Respiratory (~20 pathogens) panel by PCR     Status: None   Collection Time: 09/14/21  7:28 PM   Specimen: Nasopharyngeal Swab; Respiratory  Result Value Ref Range Status   Adenovirus NOT DETECTED NOT DETECTED Final   Coronavirus 229E NOT DETECTED NOT DETECTED Final    Comment: (NOTE) The Coronavirus on the Respiratory Panel, DOES NOT test for the novel  Coronavirus (2019 nCoV)    Coronavirus HKU1 NOT DETECTED NOT DETECTED Final   Coronavirus NL63 NOT DETECTED NOT DETECTED Final   Coronavirus OC43 NOT DETECTED NOT DETECTED Final   Metapneumovirus NOT DETECTED NOT DETECTED Final   Rhinovirus / Enterovirus NOT DETECTED NOT DETECTED Final   Influenza A NOT DETECTED NOT DETECTED Final   Influenza B NOT DETECTED NOT DETECTED Final   Parainfluenza Virus 1 NOT DETECTED NOT DETECTED Final   Parainfluenza Virus 2 NOT DETECTED NOT DETECTED Final   Parainfluenza Virus 3 NOT DETECTED NOT DETECTED Final   Parainfluenza Virus 4 NOT DETECTED NOT DETECTED Final   Respiratory Syncytial Virus NOT DETECTED NOT DETECTED Final   Bordetella pertussis NOT DETECTED NOT  DETECTED Final   Bordetella Parapertussis NOT DETECTED NOT DETECTED Final   Chlamydophila pneumoniae NOT DETECTED NOT DETECTED Final   Mycoplasma pneumoniae NOT DETECTED NOT DETECTED Final    Comment: Performed at [facility], [place] [address] [place],  [address]    Coagulation  Studies: Recent Labs    09/15/21 0415  LABPROT 15.3*  INR 1.2    Urinalysis: No results for input(s): COLORURINE, LABSPEC, PHURINE, GLUCOSEU, HGBUR, BILIRUBINUR, KETONESUR, PROTEINUR, UROBILINOGEN, NITRITE, LEUKOCYTESUR in the last 72 hours.  Invalid input(s): APPERANCEUR    Imaging: US RENAL  Result Date: 09/17/2021 CLINICAL DATA:  Acute kidney injury EXAM: RENAL / URINARY TRACT ULTRASOUND COMPLETE COMPARISON:  Renal ultrasound dated December 14, 2013 FINDINGS: Right Kidney: Renal measurements: 10.9 x 5.4 x 7.9 cm = volume: 242.8 mL. Echogenicity within normal limits. No mass or hydronephrosis visualized. Multiple simple appearing renal cysts, largest is in the upper pole of the right kidney and measures 3.5 x 3.2 x 3.3 cm. Left Kidney: Renal measurements: 13.5 x 7.0 x 6.2 cm = volume: 308.7 mL. Echogenicity within normal limits. No mass or hydronephrosis visualized. Large cystic lesion of the left upper pole and measures 6.4 x 6.2 x 4.8 cm and has several septations which are mildly thickened. Additional simple appearing renal cysts are seen. Bladder: Appears normal for degree of bladder distention. Bilateral jets visualized. Other: Prostatomegaly prostate calcifications, measuring up to 3.9 cm. IMPRESSION: 1. No hydronephrosis. 2. Multiple bilateral renal cysts, a cyst in the upper pole of the left kidney demonstrates several septations which are mildly thickened, unchanged when compared with 2015 prior ultrasound. Electronically Signed   By: Yetta Glassman M.D.   On: 09/17/2021 12:09     Assessment & Plan: Mark [name] is a 81 y.o.  male with past medical conditions including hypertension, hyperlipidemia, prostate cancer with radiation, arthritis, and CKD stage III, who was admitted to [facility] on 09/14/2021 for Weakness [R53.1] Seizure-like activity ([place]) [R56.9]  Acute Kidney injury on chronic kidney disease stage IIIa. Baseline appears to be Creatinine 1.39 with GFR 52 in 12/12/20.  Iv contrast exposure on 09/14/21. Creatinine appears to plateau at 1.85. IV contrast nephropathy usually begins to resolve in 3-5 days after exposure. Will monitor labs in am. Continue to avoid nephrotoxic agents and therapies.   2. Hypertension with chronic kidney disease.  Home regimen includes amlodipine, hydralazine, lisinopril.  Lisinopril currently held in setting of kidney injury.    LOS: [place] 6/1/20231:00 PM

## 2021-09-17 NOTE — Progress Notes (Signed)
Progress Note   Patient: Mark [name] HQI:[id number] DOB: [birth year] DOA: 09/14/2021     3 DOS: the patient was seen and examined on 09/17/2021   Brief hospital course: Taken from prior notes.  Mark [name] is a 81 year old male, PMH HTN, HLD, CKD 3, prostate CA s/p radiation, lumbar spine and bilateral hip arthritis, status post hip replacement, chronic debility followed by outpatient PT T.  Presents to ED 09/14/2021, pelvic/hip pain, lower extremity weakness, dizziness.  Recent ER visit to [facility] for hip pain, treated for arthritis with Toradol and prednisone, discharged home.  Since then, decreased mobility, increased pain, wife noted fall this morning, patient was reporting increased weakness and difficulty ambulating.  EMS assisted him to chair but still unable to move from then, EMS called again and brought to ED.  Prior baseline ambulatory with walker 4 days prior to arrival.  Wife also reported episode shaking in sleep and mild confusion but this had since resolved.  ED course, no significant concerns on vital signs, WBC slight elevation at 15.3.  CT head no concerns.  MRI head/cervical spine advanced multilevel cervical spondylosis, moderate to severe canal stenosis at C5-C6, moderate canal stenosis C4-C5 and C6-C7, multilevel bilateral foraminal stenosis severe on right C5-6.  CT chest noted coronary artery and aortic atherosclerosis.  Given imaging findings as well as acute on chronic debility/weakness neurosurgery consult was obtained, patient declined any surgical procedure or other invasive intervention.  PT/OT recommend SNF for rehab.  5/31: Remained medically stable, continue to have bilateral shoulder pain. Received insurance authorization in the afternoon, most likely will be discharged tomorrow.  6/1: Creatinine with mild worsening.  Patient did receive contrast on 09/14/2021.  Most likely contrast induced.  Renal ultrasound with no hydronephrosis and bilateral stable renal  cyst. Nephrology was consulted. DG left shoulder was also ordered for complaint of worsening left shoulder pain.   Assessment and Plan: * Stenosis of cervical spine region Taken from neurosurgery note Dr. Tamala Julian 09/14/21: "multilevel cervical stenosis without clear signal change, and a right-sided paramedian T12-L1 disc herniation.  We discussed his findings with him that some of his weakness could perhaps be coming from his disc herniation, however the severity of weakness and knee extension and hip flexion with the absence of any weakness in obturator function would argue against a radicular problem.  This is at the level of the conus which can sometimes make these symptoms difficult to discern.  He does have a history of prostate cancer and radiation so could potentially be a delayed radiation effect with femoral predominance.  However, the patient clearly stated that he would not want any surgery on his back even if it meant that he would have prolonged weakness in his lower extremity and that this was potentially salvageable versus reversible."  Supportive pain meds  PT/OT recommend SNF rehab --> got insurance authorization  Hypertension  Blood pressure elevated  -Increase amlodipine to 10 mg daily  Hypokalemia Potassium at 3.2 and magnesium of 2.2 -Replete potassium and monitor  Hypercholesterolemia  Cont home meds  AKI (acute kidney injury) ([place])  Slight worsening of creatinine today.  Renal ultrasound with no hydronephrosis and stable bilateral renal cyst.  Might be contrast induced injury.  Consult nephrology  -Give some gentle IV fluid  -Monitor renal function  -Avoid nephrotoxins  Leukocytosis  Likely d/t steroids   Fever Patient developed low-grade fever on the night of admission and remained afebrile since then, pt has no complaints as of rounds, no concerns  for infection on UA, CXR was followed w/ CT chest and no consolidation, no other s/s infection at this  time. WBC down-trending (up likely d/t steroids)   Continue to monitor and reassess as needed    Subjective: Patient was sitting comfortably in chair when seen today.  He was complaining of left shoulder pain which increases significantly with ambulation.  Physical Exam: Vitals:   09/17/21 0604 09/17/21 0605 09/17/21 0910 09/17/21 1317  BP: (!) 153/71  127/61 (!) 124/91  Pulse: (!) 39 76 75 68  Resp: '16  16 16  '$ Temp: 98 F (36.7 C)  98.3 F (36.8 C) 98.6 F (37 C)  TempSrc:      SpO2: 98% 98% 95% 98%  Weight:      Height:       General.     In no acute distress. Pulmonary.  Lungs clear bilaterally, normal respiratory effort. CV.  Regular rate and rhythm, no JVD, rub or murmur. Abdomen.  Soft, nontender, nondistended, BS positive. CNS.  Alert and oriented .  No focal neurologic deficit. Extremities.  No edema, no cyanosis, pulses intact and symmetrical. Psychiatry.  Judgment and insight appears normal.  Data Reviewed: Prior notes, labs and images reviewed  Family Communication:   Disposition: Status is: Inpatient Remains inpatient appropriate because: Severity of illness   Planned Discharge Destination: Skilled nursing facility  Time spent: 45 minutes  This record has been created using Systems analyst. Errors have been sought and corrected,but may not always be located. Such creation errors do not reflect on the standard of care.  Author: Lorella Nimrod, MD 09/17/2021 3:42 PM  For on call review [email or web address].

## 2021-09-17 NOTE — Progress Notes (Signed)
PT Cancellation Note  Patient Details Name: Mark [name] MRN: [id number] DOB: 03-17-1941   Cancelled Treatment:    Reason Eval/Treat Not Completed: Fatigue/lethargy limiting ability to participate. Patient very groggy, unable to effectively wake up to participate with therapy right now. Will re-attempt later this pm if time allows.    Franciszek Platten 09/17/2021, 1:09 PM

## 2021-09-17 NOTE — Assessment & Plan Note (Signed)
Taken from neurosurgery note Dr. Tamala Julian 09/14/21: "multilevel cervical stenosis without clear signal change, and a right-sided paramedian T12-L1 disc herniation.  We discussed his findings with him that some of his weakness could perhaps be coming from his disc herniation, however the severity of weakness and knee extension and hip flexion with the absence of any weakness in obturator function would argue against a radicular problem.  This is at the level of the conus which can sometimes make these symptoms difficult to discern.  He does have a history of prostate cancer and radiation so could potentially be a delayed radiation effect with femoral predominance.  However, the patient clearly stated that he would not want any surgery on his back even if it meant that he would have prolonged weakness in his lower extremity and that this was potentially salvageable versus reversible."  Supportive pain meds  PT/OT recommend SNF rehab --> got insurance authorization

## 2021-09-17 NOTE — Progress Notes (Signed)
Physical Therapy Treatment Patient Details Name: Mark [name] MRN: [id number] DOB: 06/19/1940 Today's Date: 09/17/2021   History of Present Illness Patient is a 81 year old male who presented to [facility] with a chief complaint of RLE weakness. 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    PT Comments    Pt was long sitting in bed upon arriving. He is endorsing severe pain in L shoulder but was agreeable to PT session. Supportive spouse at bedside. Patient required max assist fo one to achieve EOB short sit. Upon further inspection, Pt's L shoulder protruding and looks subluxed. MD notified and x ray was ordered. Acute PT will continue to follow and progress as able per current POC. SNF at [place] still most appropriate to assist pt with returning to maximal independence with ADLs.    Recommendations for follow up therapy are one component of a multi-disciplinary discharge planning process, led by the attending physician.  Recommendations may be updated based on patient status, additional functional criteria and insurance authorization.  Follow Up Recommendations  Skilled nursing-short term rehab (<3 hours/day)     Assistance Recommended at Discharge Frequent or constant Supervision/Assistance  Patient can return home with the following Two people to help with walking and/or transfers;Two people to help with bathing/dressing/bathroom;Help with stairs or ramp for entrance;Assist for transportation   Equipment Recommendations  Other (comment)       Precautions / Restrictions Precautions Precautions: Fall Restrictions Weight Bearing Restrictions: No     Mobility  Bed Mobility Overal bed mobility: Needs Assistance Bed Mobility: Supine to Sit, Sit to Supine Rolling: Max assist   Supine to sit: Max assist Sit to supine: Max assist   General bed mobility comments: Max assist to progress from long sit to EOB short sit. Upon  further inspection L shoulder looks subluxed. MD notified. X ray ordered.    Transfers  General transfer comment: Did not progress to transfers due to L shoulder concerns and pain.       Balance Overall balance assessment: Needs assistance Sitting-balance support: Feet supported Sitting balance-Leahy Scale: Good Sitting balance - Comments: Supervision sitting EOB once positioned    Cognition Arousal/Alertness: Awake/alert Behavior During Therapy: WFL for tasks assessed/performed Overall Cognitive Status: Within Functional Limits for tasks assessed    General Comments: pt denies pain despite yelling with pain during ROM         Pertinent Vitals/Pain Pain Assessment Pain Assessment: 0-10 Pain Score: 9  Pain Location: L shoulder to touch or any movements Pain Descriptors / Indicators: Aching, Discomfort Pain Intervention(s): Limited activity within patient's tolerance, Monitored during session, Premedicated before session, Repositioned, Ice applied     PT Goals (current goals can now be found in the care plan section) Acute Rehab PT Goals Patient Stated Goal: " Have less pain and move better." Progress towards PT goals: Progressing toward goals    Frequency    Min 2X/week      PT Plan Current plan remains appropriate       AM-PAC PT "6 Clicks" Mobility   Outcome Measure  Help needed turning from your back to your side while in a flat bed without using bedrails?: A Lot Help needed moving from lying on your back to sitting on the side of a flat bed without using bedrails?: A Lot Help needed moving to and from a bed to a chair (including a wheelchair)?: Total Help needed standing up from a chair using your  arms (e.g., wheelchair or bedside chair)?: Total Help needed to walk in hospital room?: Total Help needed climbing 3-5 steps with a railing? : Total 6 Click Score: 8    End of Session   Activity Tolerance: Patient limited by pain Patient left: in bed;with  call bell/phone within reach;with chair alarm set;with family/visitor present Nurse Communication: Mobility status PT Visit Diagnosis: Unsteadiness on feet (R26.81);Muscle weakness (generalized) (M62.81);Other symptoms and signs involving the nervous system (R29.898);Pain Pain - Right/Left: Left Pain - part of body: Shoulder     Time: 1658-0063 PT Time Calculation (min) (ACUTE ONLY): 13 min  Charges:  $Therapeutic Activity: 8-22 mins                     Julaine Fusi PTA 09/17/21, 4:19 PM

## 2021-09-17 NOTE — Assessment & Plan Note (Signed)
   Slight worsening of creatinine today.  Renal ultrasound with no hydronephrosis and stable bilateral renal cyst.  Might be contrast induced injury.  Consult nephrology  -Give some gentle IV fluid  -Monitor renal function  -Avoid nephrotoxins

## 2021-09-17 NOTE — Progress Notes (Signed)
PT Cancellation Note  Patient Details Name: Mark [name] MRN: [id number] DOB: 12-19-1940   Cancelled Treatment:    Reason Eval/Treat Not Completed: Patient at procedure or test/unavailable. Will re-attempt later today.    [name] 09/17/2021, 11:02 AM

## 2021-09-17 NOTE — Assessment & Plan Note (Signed)
Blood pressure elevated. Increase amlodipine to 10 mg daily. 

## 2021-09-18 DIAGNOSIS — N1831 Chronic kidney disease, stage 3a: Secondary | ICD-10-CM | POA: Diagnosis not present

## 2021-09-18 DIAGNOSIS — Z7401 Bed confinement status: Secondary | ICD-10-CM | POA: Diagnosis not present

## 2021-09-18 DIAGNOSIS — R2681 Unsteadiness on feet: Secondary | ICD-10-CM | POA: Diagnosis not present

## 2021-09-18 DIAGNOSIS — R41841 Cognitive communication deficit: Secondary | ICD-10-CM | POA: Diagnosis not present

## 2021-09-18 DIAGNOSIS — E785 Hyperlipidemia, unspecified: Secondary | ICD-10-CM | POA: Diagnosis not present

## 2021-09-18 DIAGNOSIS — I10 Essential (primary) hypertension: Secondary | ICD-10-CM | POA: Diagnosis not present

## 2021-09-18 DIAGNOSIS — N179 Acute kidney failure, unspecified: Secondary | ICD-10-CM | POA: Diagnosis not present

## 2021-09-18 DIAGNOSIS — R29898 Other symptoms and signs involving the musculoskeletal system: Secondary | ICD-10-CM | POA: Diagnosis not present

## 2021-09-18 DIAGNOSIS — N400 Enlarged prostate without lower urinary tract symptoms: Secondary | ICD-10-CM | POA: Diagnosis not present

## 2021-09-18 DIAGNOSIS — N183 Chronic kidney disease, stage 3 unspecified: Secondary | ICD-10-CM | POA: Diagnosis not present

## 2021-09-18 DIAGNOSIS — R5381 Other malaise: Secondary | ICD-10-CM | POA: Diagnosis not present

## 2021-09-18 DIAGNOSIS — I129 Hypertensive chronic kidney disease with stage 1 through stage 4 chronic kidney disease, or unspecified chronic kidney disease: Secondary | ICD-10-CM | POA: Diagnosis not present

## 2021-09-18 DIAGNOSIS — M6259 Muscle wasting and atrophy, not elsewhere classified, multiple sites: Secondary | ICD-10-CM | POA: Diagnosis not present

## 2021-09-18 DIAGNOSIS — M4802 Spinal stenosis, cervical region: Secondary | ICD-10-CM | POA: Diagnosis not present

## 2021-09-18 LAB — RENAL FUNCTION PANEL
Albumin: 2.3 g/dL — ABNORMAL LOW (ref 3.5–5.0)
Anion gap: 8 (ref 5–15)
BUN: 44 mg/dL — ABNORMAL HIGH (ref 8–23)
CO2: 25 mmol/L (ref 22–32)
Calcium: 8.1 mg/dL — ABNORMAL LOW (ref 8.9–10.3)
Chloride: 102 mmol/L (ref 98–111)
Creatinine, Ser: 1.48 mg/dL — ABNORMAL HIGH (ref 0.61–1.24)
GFR, Estimated: 48 mL/min — ABNORMAL LOW (ref >60)
Glucose, Bld: 123 mg/dL — ABNORMAL HIGH (ref 70–99)
Phosphorus: 2.7 mg/dL (ref 2.5–4.6)
Potassium: 3.1 mmol/L — ABNORMAL LOW (ref 3.5–5.1)
Sodium: 135 mmol/L (ref 135–145)

## 2021-09-18 MED ORDER — TRAMADOL HCL 50 MG PO TABS
50.0000 mg | ORAL_TABLET | Freq: Four times a day (QID) | ORAL | 0 refills | Status: DC | PRN
Start: 2021-09-18 — End: 2021-12-02

## 2021-09-18 MED ORDER — AMLODIPINE BESYLATE 10 MG PO TABS: 10.0000 mg | ORAL_TABLET | Freq: Every day | ORAL | Status: DC

## 2021-09-18 MED ORDER — POTASSIUM CHLORIDE CRYS ER 20 MEQ PO TBCR
40.0000 meq | EXTENDED_RELEASE_TABLET | Freq: Once | ORAL | Status: AC
  Administered 2021-09-18: 40 meq via ORAL
  Filled 2021-09-18: qty 2

## 2021-09-18 MED ORDER — TRAMADOL HCL 50 MG PO TABS
50.0000 mg | ORAL_TABLET | Freq: Four times a day (QID) | ORAL | Status: DC | PRN
  Administered 2021-09-18: 50 mg via ORAL
  Filled 2021-09-18: qty 1

## 2021-09-18 NOTE — TOC Progression Note (Signed)
Transition of Care [facility]) - Progression Note    Patient Details  Name: Mark [name] MRN: [id number] Date of Birth: 1940/08/29  Transition of [facility]) CM/SW [place], RN Phone Number: 09/18/2021, 12:28 PM  Clinical Narrative:     Going to room 607 at  [facility] , EMS called to transport Called to notify spouse, unable to reach, her VM is full unable to leave a VM  Expected Discharge Plan: [place] Barriers to Discharge: Ship broker, SNF Pending bed offer, Continued Medical Work up  Expected Discharge Plan and Services Expected Discharge Plan: [place] arrangements for the past 2 months: Single Family Home Expected Discharge Date: 09/18/21                                     Social Determinants of Health (SDOH) Interventions    Readmission Risk Interventions     View : No data to display.

## 2021-09-18 NOTE — Progress Notes (Signed)
[facility] was called and report was given to nurse Rolla Plate, patient is waiting to be transported by EMS.

## 2021-09-18 NOTE — Progress Notes (Signed)
[facility]  ROUNDING NOTE   Subjective:   Patient resting in bed, alert and oriented Appropriate appetite.  Continues to complain of constipation.  Requesting suppository or enema  Creatinine 1.45 UOP 1.5L in past 24 hours  Objective:  Vital signs in last 24 hours:  Temp:  [98.4 F (36.9 C)-99.3 F (37.4 C)] 98.5 F (36.9 C) (06/02 1126) Pulse Rate:  [61-76] 72 (06/02 1126) Resp:  [16-20] 18 (06/02 1126) BP: (124-145)/(64-91) 139/72 (06/02 1126) SpO2:  [94 %-98 %] 95 % (06/02 1126)  Weight change:  Filed Weights   09/14/21 0923 09/14/21 2119  Weight: 93.9 kg 87.5 kg    Intake/Output: I/O last 3 completed shifts: In: 229 [I.V.:229] Out: 1550 [Urine:1550]   Intake/Output this shift:  No intake/output data recorded.  Physical Exam: General: NAD  Head: Normocephalic, atraumatic. Moist oral mucosal membranes  Eyes: Anicteric  Lungs:  Clear to auscultation, normal effort  Heart: Regular rate and rhythm  Abdomen:  Soft, nontender  Extremities:  trace peripheral edema.  Neurologic: Nonfocal, moving all four extremities  Skin: No lesions  Access: None    Basic Metabolic Panel: Recent Labs  Lab 09/14/21 0953 09/14/21 1928 09/15/21 0415 09/16/21 0854 09/17/21 0434 09/18/21 0734  NA 135  --  137 131* 136 135  K 3.6  --  3.2* 3.0* 3.2* 3.1*  CL 104  --  103 96* 102 102  CO2 22  --  '24 27 26 25  '$ GLUCOSE 140*  --  129* 126* 128* 123*  BUN 44*  --  34* 46* 48* 44*  CREATININE 1.75* 1.39* 1.35* 1.80* 1.85* 1.48*  CALCIUM 8.7*  --  8.2* 8.1* 8.0* 8.1*  MG  --   --   --  2.2  --   --   PHOS  --   --   --   --   --  2.7    Liver Function Tests: Recent Labs  Lab 09/14/21 0953 09/15/21 0415 09/18/21 0734  AST 37 37  --   ALT 17 22  --   ALKPHOS 63 58  --   BILITOT 0.8 1.0  --   PROT 8.0 7.0  --   ALBUMIN 3.6 2.8* 2.3*   Recent Labs  Lab 09/14/21 0953  LIPASE 29   No results for input(s): AMMONIA in the last 168 hours.  CBC: Recent  Labs  Lab 09/14/21 0953 09/14/21 1928 09/15/21 0415  WBC 15.3* 14.7* 11.8*  HGB 11.4* 11.5* 10.9*  HCT 36.2* 36.2* 33.9*  MCV 90.0 87.9 87.4  PLT 293 313 278    Cardiac Enzymes: Recent Labs  Lab 09/14/21 0953  CKTOTAL 156    BNP: Invalid input(s): POCBNP  CBG: No results for input(s): GLUCAP in the last 168 hours.  Microbiology: Results for orders placed or performed during the hospital encounter of 09/14/21  Respiratory (~20 pathogens) panel by PCR     Status: None   Collection Time: 09/14/21  7:28 PM   Specimen: Nasopharyngeal Swab; Respiratory  Result Value Ref Range Status   Adenovirus NOT DETECTED NOT DETECTED Final   Coronavirus 229E NOT DETECTED NOT DETECTED Final    Comment: (NOTE) The Coronavirus on the Respiratory Panel, DOES NOT test for the novel  Coronavirus (2019 nCoV)    Coronavirus HKU1 NOT DETECTED NOT DETECTED Final   Coronavirus NL63 NOT DETECTED NOT DETECTED Final   Coronavirus OC43 NOT DETECTED NOT DETECTED Final   Metapneumovirus NOT DETECTED NOT DETECTED Final   Rhinovirus /  Enterovirus NOT DETECTED NOT DETECTED Final   Influenza A NOT DETECTED NOT DETECTED Final   Influenza B NOT DETECTED NOT DETECTED Final   Parainfluenza Virus 1 NOT DETECTED NOT DETECTED Final   Parainfluenza Virus 2 NOT DETECTED NOT DETECTED Final   Parainfluenza Virus 3 NOT DETECTED NOT DETECTED Final   Parainfluenza Virus 4 NOT DETECTED NOT DETECTED Final   Respiratory Syncytial Virus NOT DETECTED NOT DETECTED Final   Bordetella pertussis NOT DETECTED NOT DETECTED Final   Bordetella Parapertussis NOT DETECTED NOT DETECTED Final   Chlamydophila pneumoniae NOT DETECTED NOT DETECTED Final   Mycoplasma pneumoniae NOT DETECTED NOT DETECTED Final    Comment: Performed at [facility], [place] [address], [place], [place] [address]    Coagulation Studies: No results for input(s): LABPROT, INR in the last 72 hours.  Urinalysis: No results for input(s): COLORURINE,  LABSPEC, PHURINE, GLUCOSEU, HGBUR, BILIRUBINUR, KETONESUR, PROTEINUR, UROBILINOGEN, NITRITE, LEUKOCYTESUR in the last 72 hours.  Invalid input(s): APPERANCEUR    Imaging: US RENAL  Result Date: 09/17/2021 CLINICAL DATA:  Acute kidney injury EXAM: RENAL / URINARY TRACT ULTRASOUND COMPLETE COMPARISON:  Renal ultrasound dated December 14, 2013 FINDINGS: Right Kidney: Renal measurements: 10.9 x 5.4 x 7.9 cm = volume: 242.8 mL. Echogenicity within normal limits. No mass or hydronephrosis visualized. Multiple simple appearing renal cysts, largest is in the upper pole of the right kidney and measures 3.5 x 3.2 x 3.3 cm. Left Kidney: Renal measurements: 13.5 x 7.0 x 6.2 cm = volume: 308.7 mL. Echogenicity within normal limits. No mass or hydronephrosis visualized. Large cystic lesion of the left upper pole and measures 6.4 x 6.2 x 4.8 cm and has several septations which are mildly thickened. Additional simple appearing renal cysts are seen. Bladder: Appears normal for degree of bladder distention. Bilateral jets visualized. Other: Prostatomegaly prostate calcifications, measuring up to 3.9 cm. IMPRESSION: 1. No hydronephrosis. 2. Multiple bilateral renal cysts, a cyst in the upper pole of the left kidney demonstrates several septations which are mildly thickened, unchanged when compared with 2015 prior ultrasound. Electronically Signed   By: Yetta Glassman M.D.   On: 09/17/2021 12:09   DG Shoulder Left  Result Date: 09/17/2021 CLINICAL DATA:  Pain in an 81 year old male. EXAM: LEFT SHOULDER - 2+ VIEW COMPARISON:  None available FINDINGS: Glenohumeral and acromioclavicular degenerative changes without signs of acute fracture or of dislocation. Soft tissues are unremarkable. IMPRESSION: Glenohumeral and acromioclavicular degenerative changes without signs of acute fracture or dislocation. Electronically Signed   By: Zetta Bills M.D.   On: 09/17/2021 16:07     Medications:     amLODipine  10 mg Oral Daily    enoxaparin (LOVENOX) injection  0.5 mg/kg Subcutaneous Q24H   hydrALAZINE  20 mg Oral TID   rosuvastatin  20 mg Oral Daily   terazosin  5 mg Oral QHS   acetaminophen **OR** acetaminophen, albuterol, docusate sodium, ondansetron **OR** ondansetron (ZOFRAN) IV, traMADol  Assessment/ Plan:  Mr. JB DULWORTH is a 81 y.o.  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 Baseline appears to be Creatinine 1.39 with GFR 52 in 12/12/20. Iv contrast exposure on 09/14/21. Creatinine appears to plateau at 1.85. I  Renal function improving and urine output acceptable. Patient cleared to discharge from renal stance and  follow up with primary care.   Lab Results  Component Value Date   CREATININE 1.48 (H) 09/18/2021   CREATININE 1.85 (H) 09/17/2021   CREATININE 1.80 (H) 09/16/2021    Intake/Output Summary (Last 24 hours) at 09/18/2021 1255 Last data filed at 09/18/2021 2903 Gross per 24 hour  Intake 229 ml  Output 1300 ml  Net -1071 ml   2. Hypertension with chronic kidney disease.  Home regimen includes amlodipine, hydralazine, lisinopril.  Lisinopril currently held in setting of kidney injury. BP stable    LOS: 4   6/2/202312:55 PM

## 2021-09-18 NOTE — Discharge Summary (Signed)
Physician Discharge Summary   Patient: Mark [name] MRN: [id number] DOB: 1940-08-14  Admit date:     09/14/2021  Discharge date: 09/18/21  Discharge Physician: Lorella Nimrod   PCP: [place]   Recommendations at discharge:   Please  get BMP on Monday.  Follow up with PCP in one week.  Discharge Diagnoses: Principal Problem:   Stenosis of cervical spine region Active Problems:   Hypertension   Hypokalemia   Hypercholesterolemia   AKI (acute kidney injury) ([place])   Leukocytosis   Fever  Resolved Problems:   Weakness  Hospital Course: Taken from prior notes.  Mark [name] is a 81 year old male, PMH HTN, HLD, CKD 3, prostate CA s/p radiation, lumbar spine and bilateral hip arthritis, status post hip replacement, chronic debility followed by outpatient PT T.  Presents to ED 09/14/2021, pelvic/hip pain, lower extremity weakness, dizziness.  Recent ER visit to [facility] for hip pain, treated for arthritis with Toradol and prednisone, discharged home.  Since then, decreased mobility, increased pain, wife noted fall this morning, patient was reporting increased weakness and difficulty ambulating.  EMS assisted him to chair but still unable to move from then, EMS called again and brought to ED.  Prior baseline ambulatory with walker 4 days prior to arrival.  Wife also reported episode shaking in sleep and mild confusion but this had since resolved.  ED course, no significant concerns on vital signs, WBC slight elevation at 15.3.  CT head no concerns.  MRI head/cervical spine advanced multilevel cervical spondylosis, moderate to severe canal stenosis at C5-C6, moderate canal stenosis C4-C5 and C6-C7, multilevel bilateral foraminal stenosis severe on right C5-6.  CT chest noted coronary artery and aortic atherosclerosis.  Given imaging findings as well as acute on chronic debility/weakness neurosurgery consult was obtained, patient declined any surgical procedure or other invasive  intervention.  PT/OT recommend SNF for rehab.  5/31: Remained medically stable, continue to have bilateral shoulder pain. Received insurance authorization in the afternoon, most likely will be discharged tomorrow.  6/1: Creatinine with mild worsening.  Patient did receive contrast on 09/14/2021.  Most likely contrast induced.  Renal ultrasound with no hydronephrosis and bilateral stable renal cyst. Nephrology was consulted. DG left shoulder was also ordered for complaint of worsening left shoulder pain.  6/2: Renal function started improving and creatinine was 1.48 with GFR of 48 on discharge. Left shoulder x-ray with extensive arthritis, no fracture or dislocation.  Patient otherwise remained stable and ready to be discharged to rehab. Patient will need a repeat BMP on Monday.  He will continue with current medications and follow-up with his providers.  Assessment and Plan: * Stenosis of cervical spine region Taken from neurosurgery note Dr. Tamala Julian 09/14/21: "multilevel cervical stenosis without clear signal change, and a right-sided paramedian T12-L1 disc herniation.  We discussed his findings with him that some of his weakness could perhaps be coming from his disc herniation, however the severity of weakness and knee extension and hip flexion with the absence of any weakness in obturator function would argue against a radicular problem.  This is at the level of the conus which can sometimes make these symptoms difficult to discern.  He does have a history of prostate cancer and radiation so could potentially be a delayed radiation effect with femoral predominance.  However, the patient clearly stated that he would not want any surgery on his back even if it meant that he would have prolonged weakness in his lower extremity and that  this was potentially salvageable versus reversible." Supportive pain meds PT/OT recommend SNF rehab --> got insurance authorization  Hypertension Blood pressure  elevated -Increase amlodipine to 10 mg daily  Hypokalemia Potassium at 3.2 and magnesium of 2.2 -Replete potassium and monitor  Hypercholesterolemia Cont home meds  AKI (acute kidney injury) ([place]) Creatinine started improving.  Renal ultrasound with no hydronephrosis and stable bilateral renal cyst.  Might be contrast induced injury. Consult nephrology -Monitor renal function -Avoid nephrotoxins  Leukocytosis Likely d/t steroids   Fever Patient developed low-grade fever on the night of admission and remained afebrile since then, pt has no complaints as of rounds, no concerns for infection on UA, CXR was followed w/ CT chest and no consolidation, no other s/s infection at this time. WBC down-trending (up likely d/t steroids)  Continue to monitor and reassess as needed    Pain control - [facility] Controlled Substance Reporting System database was reviewed. and patient was instructed, not to drive, operate heavy machinery, perform activities at heights, swimming or participation in water activities or provide baby-sitting services while on Pain, Sleep and Anxiety Medications; until their outpatient Physician has advised to do so again. Also recommended to not to take more than prescribed Pain, Sleep and Anxiety Medications.  Consultants: Neurosurgery Procedures performed: None Disposition: Skilled nursing facility Diet recommendation:  Discharge Diet Orders (From admission, onward)     Start     Ordered   09/15/21 0000  Diet - low sodium heart healthy        09/15/21 1301           Cardiac diet DISCHARGE MEDICATION: Allergies as of 09/18/2021   No Known Allergies      Medication List     STOP taking these medications    HYDROcodone-acetaminophen 5-325 MG tablet Commonly known as: NORCO/VICODIN   predniSONE 20 MG tablet Commonly known as: DELTASONE       TAKE these medications    amLODipine 10 MG tablet Commonly known as: NORVASC Take 1 tablet (10 mg  total) by mouth daily. What changed:  medication strength how much to take Another medication with the same name was removed. Continue taking this medication, and follow the directions you see here.   docusate sodium 100 MG capsule Commonly known as: Colace Take 1 capsule (100 mg total) by mouth daily as needed.   hydrALAZINE 10 MG tablet Commonly known as: APRESOLINE Take 20 mg by mouth 3 (three) times daily.   lidocaine 5 % ointment Commonly known as: XYLOCAINE Apply 1 application topically as needed for moderate pain.   lisinopril 40 MG tablet Commonly known as: ZESTRIL Take 1 tablet (40 mg total) by mouth daily.   rosuvastatin 20 MG tablet Commonly known as: CRESTOR Take 20 mg by mouth daily.   terazosin 5 MG capsule Commonly known as: HYTRIN Take 5 mg by mouth at bedtime.   traMADol 50 MG tablet Commonly known as: ULTRAM Take 1 tablet (50 mg total) by mouth every 6 (six) hours as needed for moderate pain or severe pain.        Contact information for follow-up providers     Center, [facility]. Schedule an appointment as soon as possible for a visit in 1 week(s).   Specialty: General Practice Contact information: [place] [address] [phone]              Contact information for after-discharge care     Destination     [facility] Preferred SNF .   Service: Skilled Nursing Contact information: [address] [place] [phone]                    Discharge Exam: [name] Weights   09/14/21 6314 09/14/21 2119  Weight: 93.9 kg 87.5 kg   General.     In no acute distress. Pulmonary.  Lungs clear bilaterally, normal respiratory effort. CV.  Regular rate and rhythm, no JVD, rub or murmur. Abdomen.  Soft, nontender, nondistended, BS positive. CNS.  Alert and oriented .  No focal neurologic deficit. Extremities.  No edema, no cyanosis, pulses intact and  symmetrical. Psychiatry.  Judgment and insight appears normal.   Condition at discharge: stable  The results of significant diagnostics from this hospitalization (including imaging, microbiology, ancillary and laboratory) are listed below for reference.   Imaging Studies: DG Chest 2 View  Result Date: 09/14/2021 CLINICAL DATA:  81 year old male with history of lower back and shoulder pain. Evaluate for infiltrate. EXAM: CHEST - 2 VIEW COMPARISON:  No priors. FINDINGS: Lung volumes are low. Ill-defined opacity in the left lung base which may reflect atelectasis and/or consolidation. No pleural effusions. No pneumothorax. No pulmonary nodule or mass noted. Pulmonary vasculature and the cardiomediastinal silhouette are within normal limits. Atherosclerotic calcifications in the thoracic aorta. IMPRESSION: 1. Low lung volumes with ill-defined opacity in the left lung base, which may reflect areas of atelectasis and/or consolidation. 2. Aortic atherosclerosis. Electronically Signed   By: Vinnie Langton M.D.   On: 09/14/2021 12:35   CT HEAD WO CONTRAST (5MM)  Result Date: 09/14/2021 CLINICAL DATA:  Altered mental status. EXAM: CT HEAD WITHOUT CONTRAST TECHNIQUE: Contiguous axial images were obtained from the base of the skull through the vertex without intravenous contrast. RADIATION DOSE REDUCTION: This exam was performed according to the departmental dose-optimization program which includes automated exposure control, adjustment of the mA and/or kV according to patient size and/or use of iterative reconstruction technique. COMPARISON:  None Available. FINDINGS: Brain: No evidence of intracranial hemorrhage, acute infarction, hydrocephalus, extra-axial collection, or mass lesion/mass effect. Moderate chronic small vessel disease is noted. Vascular:  No hyperdense vessel or other acute findings. Skull: No evidence of fracture or other significant bone abnormality. Sinuses/Orbits:  No acute findings. Other:  None. IMPRESSION: No acute intracranial abnormality. Moderate chronic small vessel disease. Electronically Signed   By: Marlaine Hind M.D.   On: 09/14/2021 16:04   MR BRAIN WO CONTRAST  Result Date: 09/14/2021 CLINICAL DATA:  Neuro deficit, acute, stroke suspected EXAM: MRI HEAD WITHOUT CONTRAST TECHNIQUE: Multiplanar, multiecho pulse sequences of the brain and surrounding structures were obtained without intravenous contrast. COMPARISON:  CT head from the same day. FINDINGS: Brain: No acute infarction, hemorrhage, hydrocephalus, extra-axial collection or mass lesion. Moderate patchy and confluent T2/FLAIR hyperintensities in the white matter, nonspecific but compatible with chronic microvascular ischemic disease. The small benign dilated perivascular spaces within the basal ganglia bilaterally. Remote small lacunar infarcts in bilateral cerebellum. Vascular: Major arterial flow voids are maintained at the skull base. Skull and upper cervical spine: Normal marrow signal. Sinuses/Orbits: Clear sinuses.  No acute orbital findings. Other: No mastoid effusions. IMPRESSION: 1. No evidence of acute intracranial abnormality. 2. Moderate chronic microvascular ischemic disease. Electronically Signed   By: Margaretha Sheffield M.D.   On: 09/14/2021 19:05   MR Cervical Spine Wo Contrast  Result Date: 09/14/2021 CLINICAL DATA:  Ataxia, nontraumatic, cervical pathology suspected EXAM: MRI CERVICAL SPINE WITHOUT CONTRAST TECHNIQUE: Multiplanar, multisequence  MR imaging of the cervical spine was performed. No intravenous contrast was administered. COMPARISON:  None Available. FINDINGS: Alignment: Straightening of the cervical lordosis with grade 1 anterolisthesis of C7 on T1. Vertebrae: No fracture, evidence of discitis, or bone lesion. Multilevel discogenic endplate marrow changes and anterior endplate osteophytosis. Cord: Normal signal and morphology. Posterior Fossa, vertebral arteries, paraspinal tissues: Negative. Disc  levels: C2-C3: Disc osteophyte complex with central protrusion. Left greater than right facet arthropathy. Mild canal stenosis. No significant foraminal stenosis. C3-C4: Disc osteophyte complex with central protrusion. Bilateral facet arthropathy and uncovertebral spurring. Mild canal stenosis with mild bilateral foraminal stenosis. C4-C5: Disc osteophyte complex with mild bilateral facet and uncovertebral spurring. Moderate canal stenosis with moderate bilateral foraminal stenosis. C5-C6: Disc osteophyte complex with right paracentral protrusion. Bilateral facet and uncovertebral arthropathy. Moderate-severe canal stenosis with severe right and moderate left foraminal stenosis. C6-C7: Disc osteophyte complex, eccentric to the right. Bilateral facet and uncovertebral arthropathy. Moderate canal stenosis with mild-to-moderate bilateral foraminal stenosis. C7-T1: Disc uncovering without focal protrusion. Mild bilateral facet arthropathy. No foraminal or canal stenosis. IMPRESSION: 1. 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 2. Multilevel bilateral foraminal stenosis, severe on the right at C5-6. Electronically Signed   By: Davina Poke D.O.   On: 09/14/2021 17:34   MR THORACIC SPINE WO CONTRAST  Result Date: 09/14/2021 CLINICAL DATA:  Back trauma. Abnormal neuro exam. Lower leg weakness. EXAM: MRI THORACIC SPINE WITHOUT CONTRAST TECHNIQUE: Multiplanar, multisequence MR imaging of the thoracic spine was performed. No intravenous contrast was administered. COMPARISON:  Same day CT chest, abdomen, and pelvis 09/14/2021 FINDINGS: Alignment:  Minimal 2 mm grade 1 anterolisthesis of C7 on T1. Vertebrae: Vertebral body heights are maintained. Severe T8-9 and T9-10 disc space narrowing. Severe partially visualized C5-6, severe moderate C6-7, and mild C7-T1 disc space narrowing. Decreased disc hydration is greatest at C5-6  through T2-3 and T7-8 through T10-11. Mild-to-moderate C6-7 and mild C7-T1, mild T2-3, mild T5-6, mild T6-7 chronic fat intensity marrow endplate degenerative changes. No acute fracture or destructive bone lesion. Cord: Normal signal and morphology of the thoracic cord. The conus terminates at the superior L1 level. Paraspinal and other soft tissues: The kidneys are partially visualized. There are partially visualized cysts within the bilateral kidneys, measuring at least 6 cm on the left. Disc levels: Please see contemporaneous MRI of the cervical spine for evaluation of the lower cervical spine levels. T1-2: Mild broad-based posterior disc bulge. Mild-to-moderate bilateral facet joint hypertrophy. Mild right neuroforaminal stenosis. T9-10: Mild bilateral facet joint hypertrophy. Minimal posterior disc bulge. Mild narrowing of the left-greater-than-right lateral recesses. Borderline mild bilateral neuroforaminal stenosis. T10-11: Mild-to-moderate bilateral facet joint hypertrophy. Midline posterior annular disc tear. Minimal posterior disc bulge. Mild-to-moderate bilateral neuroforaminal stenosis. No central canal stenosis. T11-12: Mild bilateral facet joint hypertrophy. Minimal posterior disc bulge. Mild bilateral neuroforaminal stenosis. No central canal stenosis. T12-L1: Right paracentral posterior disc protrusion measures up to 8 mm in AP dimension and contributes to severe right lateral recess stenosis. Mild-to-moderate bilateral facet joint hypertrophy. Moderate right and mild left neuroforaminal stenosis. Overall mild central canal stenosis. IMPRESSION: Multilevel degenerative disc and joint changes as above. Mild-to-moderate bilateral T10-11 neuroforaminal stenosis. T12-L1 right paracentral posterior disc protrusion contributes to severe right lateral recess stenosis and mild central canal stenosis. Moderate right and mild left neuroforaminal stenosis. Electronically Signed   By: Yvonne Kendall M.D.   On:  09/14/2021 21:36   MR LUMBAR SPINE  WO CONTRAST  Result Date: 09/14/2021 CLINICAL DATA:  Lower back pain. Cauda equina syndrome suspected. Lower leg weakness. EXAM: MRI LUMBAR SPINE WITHOUT CONTRAST TECHNIQUE: Multiplanar, multisequence MR imaging of the lumbar spine was performed. No intravenous contrast was administered. COMPARISON:  CT chest, abdomen and pelvis earlier same day 09/14/2021 FINDINGS: Segmentation:  Standard. Alignment: 4 mm grade 1 anterolisthesis of L4 on L5. Mild levocurvature centered at L2-3. Vertebrae: Vertebral body heights are maintained. Scattered vertebral body fat intensity hemangiomas. Mild L2-3, severe posterior L3-4: Mild posterior L4-5 disc space narrowing. Mild-to-moderate chronic fat intensity marrow endplate degenerative changes at the posterior L3-4 level. No acute fracture. Conus medullaris and cauda equina: Conus extends to the superior L1 level. Conus and cauda equina appear normal. Paraspinal and other soft tissues: The kidneys are partially visualized. There are decreased T1 increased T2 signal cysts within the left-greater-than-right kidneys. No abdominal aortic aneurysm. Disc levels: T12-L1: Right paracentral posterior disc protrusion measures up to 8 mm in AP dimension and contributes to severe right lateral recess stenosis. Mild-to-moderate bilateral facet joint hypertrophy. Moderate right and mild left neuroforaminal stenosis. Overall mild central canal stenosis. L1-2: Mild bilateral facet joint hypertrophy. No posterior disc bulge central canal narrowing, or neuroforaminal stenosis. L2-3: Mild-to-moderate bilateral facet joint hypertrophy. Mild broad-based posterior disc osteophyte complex. Mild right neuroforaminal stenosis. Mild narrowing of the lateral recesses and borderline mild central canal stenosis. L3-4: Mild-to-moderate bilateral facet hypertrophy. Moderate broad-based posterior disc osteophyte complex with moderate bilateral intraforaminal extension.  Moderate bilateral neuroforaminal stenosis. Moderate narrowing of the lateral recesses and moderate central canal stenosis. L4-5: Severe bilateral facet joint hypertrophy. Mild right facet joint effusion. Posterior disc uncovering with minimal posterior disc bulge. Moderate bilateral neuroforaminal stenosis. Severe narrowing of the lateral recesses and severe central canal stenosis. L5-S1: Severe left-greater-than-right facet joint hypertrophy. Small left intraforaminal annular disc tear. No significant posterior disc bulge. No significant neuroforaminal stenosis. No central canal stenosis. IMPRESSION: 1. Multilevel degenerative disc and joint changes as above. Grade 1 anterolisthesis of L4 on L5. 2. Moderate L3-4 and severe L4-5 central canal stenosis. 3. Multilevel neuroforaminal stenosis including moderate bilateral L3-4 and moderate bilateral L4-5 neuroforaminal stenosis. Electronically Signed   By: Yvonne Kendall M.D.   On: 09/14/2021 21:44   US RENAL  Result Date: 09/17/2021 CLINICAL DATA:  Acute kidney injury EXAM: RENAL / URINARY TRACT ULTRASOUND COMPLETE COMPARISON:  Renal ultrasound dated December 14, 2013 FINDINGS: Right Kidney: Renal measurements: 10.9 x 5.4 x 7.9 cm = volume: 242.8 mL. Echogenicity within normal limits. No mass or hydronephrosis visualized. Multiple simple appearing renal cysts, largest is in the upper pole of the right kidney and measures 3.5 x 3.2 x 3.3 cm. Left Kidney: Renal measurements: 13.5 x 7.0 x 6.2 cm = volume: 308.7 mL. Echogenicity within normal limits. No mass or hydronephrosis visualized. Large cystic lesion of the left upper pole and measures 6.4 x 6.2 x 4.8 cm and has several septations which are mildly thickened. Additional simple appearing renal cysts are seen. Bladder: Appears normal for degree of bladder distention. Bilateral jets visualized. Other: Prostatomegaly prostate calcifications, measuring up to 3.9 cm. IMPRESSION: 1. No hydronephrosis. 2. Multiple bilateral  renal cysts, a cyst in the upper pole of the left kidney demonstrates several septations which are mildly thickened, unchanged when compared with 2015 prior ultrasound. Electronically Signed   By: Yetta Glassman M.D.   On: 09/17/2021 12:09   CT CHEST ABDOMEN PELVIS W CONTRAST  Result Date: 09/14/2021 CLINICAL DATA:  Weakness, abdominal pain  and shoulder pain. Clinical concern for lung infiltrate, diverticulitis, small bowel obstruction or mass. EXAM: CT CHEST, ABDOMEN, AND PELVIS WITH CONTRAST TECHNIQUE: Multidetector CT imaging of the chest, abdomen and pelvis was performed following the standard protocol during bolus administration of intravenous contrast. RADIATION DOSE REDUCTION: This exam was performed according to the departmental dose-optimization program which includes automated exposure control, adjustment of the mA and/or kV according to patient size and/or use of iterative reconstruction technique. CONTRAST:  4m OMNIPAQUE IOHEXOL 300 MG/ML  SOLN COMPARISON:  Chest radiographs obtained earlier today. Renal ultrasound dated 12/06/2013. Abdomen and pelvis CT dated 04/15/2020. FINDINGS: CT CHEST FINDINGS Cardiovascular: Atheromatous calcifications, including the coronary arteries and aorta. Normal sized heart. Mediastinum/Nodes: No enlarged mediastinal, hilar, or axillary lymph nodes. Thyroid gland, trachea, and esophagus demonstrate no significant findings. Lungs/Pleura: Bilateral dependent atelectasis.  No pleural fluid. Musculoskeletal: Thoracic and lower cervical spine degenerative changes. Mild bilateral gynecomastia. CT ABDOMEN PELVIS FINDINGS Hepatobiliary: Normal appearing gallbladder. Stable right lobe liver probable cyst. Pancreas: Unremarkable. No pancreatic ductal dilatation or surrounding inflammatory changes. Spleen: Normal in size without focal abnormality. Adrenals/Urinary Tract: Stable mild bilateral adrenal hyperplasia. Stable bilateral renal cysts. Unremarkable ureters and urinary  bladder. Stomach/Bowel: Prominent stool in the right, proximal transverse and proximal descending colon. Multiple small sigmoid colon diverticula without evidence diverticulitis. Normal appearing appendix, small bowel and stomach. Vascular/Lymphatic: Atheromatous arterial calcifications without aneurysm. No enlarged lymph nodes. Reproductive: Moderately enlarged prostate gland containing 3 small surgical clips or radiation seeds. Other: No abdominal wall hernia or abnormality. No abdominopelvic ascites. Musculoskeletal: Left hip prosthesis. Lumbar spine degenerative changes, including marked facet degenerative changes at the L4-5 and L5-S1 levels with associated grade 1 anterolisthesis at the L4-5 level. IMPRESSION: 1. No acute abnormality in the chest, abdomen or pelvis. 2.  Calcific coronary artery and aortic atherosclerosis. 3. Prominent stool in the right, proximal transverse and proximal descending colon. Electronically Signed   By: SClaudie ReveringM.D.   On: 09/14/2021 14:28   DG Shoulder Left  Result Date: 09/17/2021 CLINICAL DATA:  Pain in an 81year old male. EXAM: LEFT SHOULDER - 2+ VIEW COMPARISON:  None available FINDINGS: Glenohumeral and acromioclavicular degenerative changes without signs of acute fracture or of dislocation. Soft tissues are unremarkable. IMPRESSION: Glenohumeral and acromioclavicular degenerative changes without signs of acute fracture or dislocation. Electronically Signed   By: GZetta BillsM.D.   On: 09/17/2021 16:07   DG Hips Bilat W or Wo Pelvis 2 Views  Result Date: 09/11/2021 CLINICAL DATA:  Pain. EXAM: DG HIP (WITH OR WITHOUT PELVIS) 2V BILAT COMPARISON:  Left hip x-ray 08/25/2016. FINDINGS: There is a new left hip arthroplasty in anatomic alignment. No evidence for hardware loosening. There are severe degenerative changes of the right hip with joint space narrowing, sclerosis and osteophyte formation. No acute fractures are seen. Prostate radiotherapy seeds are present.  IMPRESSION: 1. No acute fracture or dislocation. 2. 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 3. Left hip arthroplasty appears uncomplicated. Electronically Signed   By: ARonney AstersM.D.   On: 09/11/2021 20:48    Microbiology: Results for orders placed or performed during the hospital encounter of 09/14/21  Respiratory (~20 pathogens) panel by PCR     Status: None   Collection Time: 09/14/21  7:28 PM   Specimen: Nasopharyngeal Swab; Respiratory  Result Value Ref Range Status   Adenovirus NOT DETECTED NOT DETECTED Final   Coronavirus 229E NOT DETECTED NOT DETECTED Final    Comment: (NOTE) The Coronavirus on the Respiratory Panel,  DOES NOT test for the novel  Coronavirus (2019 nCoV)    Coronavirus HKU1 NOT DETECTED NOT DETECTED Final   Coronavirus NL63 NOT DETECTED NOT DETECTED Final   Coronavirus OC43 NOT DETECTED NOT DETECTED Final   Metapneumovirus NOT DETECTED NOT DETECTED Final   Rhinovirus / Enterovirus NOT DETECTED NOT DETECTED Final   Influenza A NOT DETECTED NOT DETECTED Final   Influenza B NOT DETECTED NOT DETECTED Final   Parainfluenza Virus 1 NOT DETECTED NOT DETECTED Final   Parainfluenza Virus 2 NOT DETECTED NOT DETECTED Final   Parainfluenza Virus 3 NOT DETECTED NOT DETECTED Final   Parainfluenza Virus 4 NOT DETECTED NOT DETECTED Final   Respiratory Syncytial Virus NOT DETECTED NOT DETECTED Final   Bordetella pertussis NOT DETECTED NOT DETECTED Final   Bordetella Parapertussis NOT DETECTED NOT DETECTED Final   Chlamydophila pneumoniae NOT DETECTED NOT DETECTED Final   Mycoplasma pneumoniae NOT DETECTED NOT DETECTED Final    Comment: Performed at [facility], [place] [address], [place], [place] [address]    Labs: CBC: Recent Labs  Lab 09/14/21 0953 09/14/21 1928 09/15/21 0415  WBC 15.3* 14.7* 11.8*  HGB 11.4* 11.5* 10.9*  HCT 36.2* 36.2* 33.9*  MCV 90.0 87.9 87.4  PLT 293 313 250   Basic Metabolic Panel: Recent Labs  Lab 09/14/21 0953  09/14/21 1928 09/15/21 0415 09/16/21 0854 09/17/21 0434 09/18/21 0734  NA 135  --  137 131* 136 135  K 3.6  --  3.2* 3.0* 3.2* 3.1*  CL 104  --  103 96* 102 102  CO2 22  --  '24 27 26 25  '$ GLUCOSE 140*  --  129* 126* 128* 123*  BUN 44*  --  34* 46* 48* 44*  CREATININE 1.75* 1.39* 1.35* 1.80* 1.85* 1.48*  CALCIUM 8.7*  --  8.2* 8.1* 8.0* 8.1*  MG  --   --   --  2.2  --   --   PHOS  --   --   --   --   --  2.7   Liver Function Tests: Recent Labs  Lab 09/14/21 0953 09/15/21 0415 09/18/21 0734  AST 37 37  --   ALT 17 22  --   ALKPHOS 63 58  --   BILITOT 0.8 1.0  --   PROT 8.0 7.0  --   ALBUMIN 3.6 2.8* 2.3*   CBG: No results for input(s): GLUCAP in the last 168 hours.  Discharge time spent: greater than 30 minutes.  This record has been created using Systems analyst. Errors have been sought and corrected,but may not always be located. Such creation errors do not reflect on the standard of care.   Signed: Lorella Nimrod, MD [facility] 09/18/2021

## 2021-09-18 NOTE — Assessment & Plan Note (Signed)
   Creatinine started improving.  Renal ultrasound with no hydronephrosis and stable bilateral renal cyst.  Might be contrast induced injury.  Consult nephrology  -Monitor renal function  -Avoid nephrotoxins

## 2021-09-21 DIAGNOSIS — I63532 Cerebral infarction due to unspecified occlusion or stenosis of left posterior cerebral artery: Secondary | ICD-10-CM | POA: Diagnosis not present

## 2021-09-21 DIAGNOSIS — R9431 Abnormal electrocardiogram [ECG] [EKG]: Secondary | ICD-10-CM | POA: Diagnosis not present

## 2021-09-21 DIAGNOSIS — G939 Disorder of brain, unspecified: Secondary | ICD-10-CM | POA: Diagnosis not present

## 2021-09-21 DIAGNOSIS — E876 Hypokalemia: Secondary | ICD-10-CM | POA: Diagnosis not present

## 2021-09-21 DIAGNOSIS — R9341 Abnormal radiologic findings on diagnostic imaging of renal pelvis, ureter, or bladder: Secondary | ICD-10-CM | POA: Diagnosis not present

## 2021-09-21 DIAGNOSIS — D72829 Elevated white blood cell count, unspecified: Secondary | ICD-10-CM | POA: Diagnosis not present

## 2021-09-21 DIAGNOSIS — R4182 Altered mental status, unspecified: Secondary | ICD-10-CM | POA: Diagnosis not present

## 2021-09-21 DIAGNOSIS — I6622 Occlusion and stenosis of left posterior cerebral artery: Secondary | ICD-10-CM | POA: Diagnosis not present

## 2021-09-21 DIAGNOSIS — M6281 Muscle weakness (generalized): Secondary | ICD-10-CM | POA: Diagnosis not present

## 2021-09-21 DIAGNOSIS — I639 Cerebral infarction, unspecified: Secondary | ICD-10-CM | POA: Diagnosis not present

## 2021-09-21 DIAGNOSIS — R29810 Facial weakness: Secondary | ICD-10-CM | POA: Diagnosis not present

## 2021-09-21 DIAGNOSIS — R531 Weakness: Secondary | ICD-10-CM | POA: Diagnosis not present

## 2021-09-21 DIAGNOSIS — I10 Essential (primary) hypertension: Secondary | ICD-10-CM | POA: Diagnosis not present

## 2021-09-21 DIAGNOSIS — R778 Other specified abnormalities of plasma proteins: Secondary | ICD-10-CM | POA: Diagnosis not present

## 2021-09-21 DIAGNOSIS — R42 Dizziness and giddiness: Secondary | ICD-10-CM | POA: Diagnosis not present

## 2021-09-22 DIAGNOSIS — I330 Acute and subacute infective endocarditis: Secondary | ICD-10-CM | POA: Diagnosis not present

## 2021-09-22 DIAGNOSIS — R2681 Unsteadiness on feet: Secondary | ICD-10-CM | POA: Diagnosis not present

## 2021-09-22 DIAGNOSIS — R778 Other specified abnormalities of plasma proteins: Secondary | ICD-10-CM | POA: Diagnosis not present

## 2021-09-22 DIAGNOSIS — N179 Acute kidney failure, unspecified: Secondary | ICD-10-CM | POA: Diagnosis not present

## 2021-09-22 DIAGNOSIS — I10 Essential (primary) hypertension: Secondary | ICD-10-CM | POA: Diagnosis not present

## 2021-09-22 DIAGNOSIS — A4101 Sepsis due to Methicillin susceptible Staphylococcus aureus: Secondary | ICD-10-CM | POA: Diagnosis not present

## 2021-09-22 DIAGNOSIS — R531 Weakness: Secondary | ICD-10-CM | POA: Diagnosis not present

## 2021-09-22 DIAGNOSIS — Z96642 Presence of left artificial hip joint: Secondary | ICD-10-CM | POA: Diagnosis not present

## 2021-09-22 DIAGNOSIS — B9561 Methicillin susceptible Staphylococcus aureus infection as the cause of diseases classified elsewhere: Secondary | ICD-10-CM | POA: Diagnosis not present

## 2021-09-22 DIAGNOSIS — I129 Hypertensive chronic kidney disease with stage 1 through stage 4 chronic kidney disease, or unspecified chronic kidney disease: Secondary | ICD-10-CM | POA: Diagnosis not present

## 2021-09-22 DIAGNOSIS — I341 Nonrheumatic mitral (valve) prolapse: Secondary | ICD-10-CM | POA: Diagnosis not present

## 2021-09-22 DIAGNOSIS — M25451 Effusion, right hip: Secondary | ICD-10-CM | POA: Diagnosis not present

## 2021-09-22 DIAGNOSIS — E785 Hyperlipidemia, unspecified: Secondary | ICD-10-CM | POA: Diagnosis not present

## 2021-09-22 DIAGNOSIS — M25552 Pain in left hip: Secondary | ICD-10-CM | POA: Diagnosis not present

## 2021-09-22 DIAGNOSIS — R29810 Facial weakness: Secondary | ICD-10-CM | POA: Diagnosis not present

## 2021-09-22 DIAGNOSIS — F0390 Unspecified dementia without behavioral disturbance: Secondary | ICD-10-CM | POA: Diagnosis not present

## 2021-09-22 DIAGNOSIS — R011 Cardiac murmur, unspecified: Secondary | ICD-10-CM | POA: Diagnosis not present

## 2021-09-22 DIAGNOSIS — I340 Nonrheumatic mitral (valve) insufficiency: Secondary | ICD-10-CM | POA: Diagnosis not present

## 2021-09-22 DIAGNOSIS — R41841 Cognitive communication deficit: Secondary | ICD-10-CM | POA: Diagnosis not present

## 2021-09-22 DIAGNOSIS — M25512 Pain in left shoulder: Secondary | ICD-10-CM | POA: Diagnosis not present

## 2021-09-22 DIAGNOSIS — R410 Disorientation, unspecified: Secondary | ICD-10-CM | POA: Diagnosis not present

## 2021-09-22 DIAGNOSIS — D649 Anemia, unspecified: Secondary | ICD-10-CM | POA: Diagnosis not present

## 2021-09-22 DIAGNOSIS — I38 Endocarditis, valve unspecified: Secondary | ICD-10-CM | POA: Diagnosis not present

## 2021-09-22 DIAGNOSIS — I639 Cerebral infarction, unspecified: Secondary | ICD-10-CM | POA: Diagnosis not present

## 2021-09-22 DIAGNOSIS — I059 Rheumatic mitral valve disease, unspecified: Secondary | ICD-10-CM | POA: Diagnosis not present

## 2021-09-22 DIAGNOSIS — N189 Chronic kidney disease, unspecified: Secondary | ICD-10-CM | POA: Diagnosis not present

## 2021-09-22 DIAGNOSIS — I6340 Cerebral infarction due to embolism of unspecified cerebral artery: Secondary | ICD-10-CM | POA: Diagnosis not present

## 2021-09-22 DIAGNOSIS — Z0389 Encounter for observation for other suspected diseases and conditions ruled out: Secondary | ICD-10-CM | POA: Diagnosis not present

## 2021-09-22 DIAGNOSIS — R7401 Elevation of levels of liver transaminase levels: Secondary | ICD-10-CM | POA: Diagnosis not present

## 2021-09-22 DIAGNOSIS — R4182 Altered mental status, unspecified: Secondary | ICD-10-CM | POA: Diagnosis not present

## 2021-09-22 DIAGNOSIS — I21A1 Myocardial infarction type 2: Secondary | ICD-10-CM | POA: Diagnosis not present

## 2021-09-22 DIAGNOSIS — I3489 Other nonrheumatic mitral valve disorders: Secondary | ICD-10-CM | POA: Diagnosis not present

## 2021-09-22 DIAGNOSIS — S79912A Unspecified injury of left hip, initial encounter: Secondary | ICD-10-CM | POA: Diagnosis not present

## 2021-09-22 DIAGNOSIS — K5900 Constipation, unspecified: Secondary | ICD-10-CM | POA: Diagnosis not present

## 2021-09-22 DIAGNOSIS — I669 Occlusion and stenosis of unspecified cerebral artery: Secondary | ICD-10-CM | POA: Diagnosis not present

## 2021-09-22 DIAGNOSIS — M25461 Effusion, right knee: Secondary | ICD-10-CM | POA: Diagnosis not present

## 2021-09-22 DIAGNOSIS — R42 Dizziness and giddiness: Secondary | ICD-10-CM | POA: Diagnosis not present

## 2021-09-22 DIAGNOSIS — E871 Hypo-osmolality and hyponatremia: Secondary | ICD-10-CM | POA: Diagnosis not present

## 2021-09-22 DIAGNOSIS — T8452XA Infection and inflammatory reaction due to internal left hip prosthesis, initial encounter: Secondary | ICD-10-CM | POA: Diagnosis not present

## 2021-09-22 DIAGNOSIS — N400 Enlarged prostate without lower urinary tract symptoms: Secondary | ICD-10-CM | POA: Diagnosis not present

## 2021-09-22 DIAGNOSIS — I76 Septic arterial embolism: Secondary | ICD-10-CM | POA: Diagnosis not present

## 2021-09-22 DIAGNOSIS — N183 Chronic kidney disease, stage 3 unspecified: Secondary | ICD-10-CM | POA: Diagnosis not present

## 2021-09-22 DIAGNOSIS — D72829 Elevated white blood cell count, unspecified: Secondary | ICD-10-CM | POA: Diagnosis not present

## 2021-09-22 DIAGNOSIS — J9811 Atelectasis: Secondary | ICD-10-CM | POA: Diagnosis not present

## 2021-09-22 DIAGNOSIS — M6259 Muscle wasting and atrophy, not elsewhere classified, multiple sites: Secondary | ICD-10-CM | POA: Diagnosis not present

## 2021-09-22 DIAGNOSIS — R7881 Bacteremia: Secondary | ICD-10-CM | POA: Diagnosis not present

## 2021-09-22 DIAGNOSIS — R69 Illness, unspecified: Secondary | ICD-10-CM | POA: Diagnosis not present

## 2021-09-22 DIAGNOSIS — G8194 Hemiplegia, unspecified affecting left nondominant side: Secondary | ICD-10-CM | POA: Diagnosis not present

## 2021-09-22 DIAGNOSIS — M6281 Muscle weakness (generalized): Secondary | ICD-10-CM | POA: Diagnosis not present

## 2021-09-22 DIAGNOSIS — R29708 NIHSS score 8: Secondary | ICD-10-CM | POA: Diagnosis not present

## 2021-09-22 DIAGNOSIS — M47816 Spondylosis without myelopathy or radiculopathy, lumbar region: Secondary | ICD-10-CM | POA: Diagnosis not present

## 2021-09-22 DIAGNOSIS — M5125 Other intervertebral disc displacement, thoracolumbar region: Secondary | ICD-10-CM | POA: Diagnosis not present

## 2021-09-22 DIAGNOSIS — M1611 Unilateral primary osteoarthritis, right hip: Secondary | ICD-10-CM | POA: Diagnosis not present

## 2021-09-22 DIAGNOSIS — R5381 Other malaise: Secondary | ICD-10-CM | POA: Diagnosis not present

## 2021-09-22 DIAGNOSIS — M5126 Other intervertebral disc displacement, lumbar region: Secondary | ICD-10-CM | POA: Diagnosis not present

## 2021-09-22 DIAGNOSIS — M4802 Spinal stenosis, cervical region: Secondary | ICD-10-CM | POA: Diagnosis not present

## 2021-09-29 DIAGNOSIS — R41841 Cognitive communication deficit: Secondary | ICD-10-CM | POA: Diagnosis not present

## 2021-09-29 DIAGNOSIS — Z7401 Bed confinement status: Secondary | ICD-10-CM | POA: Diagnosis not present

## 2021-09-29 DIAGNOSIS — R0902 Hypoxemia: Secondary | ICD-10-CM | POA: Diagnosis not present

## 2021-09-29 DIAGNOSIS — R410 Disorientation, unspecified: Secondary | ICD-10-CM | POA: Diagnosis not present

## 2021-09-29 DIAGNOSIS — E876 Hypokalemia: Secondary | ICD-10-CM | POA: Diagnosis not present

## 2021-09-29 DIAGNOSIS — M542 Cervicalgia: Secondary | ICD-10-CM | POA: Diagnosis not present

## 2021-09-29 DIAGNOSIS — I10 Essential (primary) hypertension: Secondary | ICD-10-CM | POA: Diagnosis not present

## 2021-09-29 DIAGNOSIS — I6340 Cerebral infarction due to embolism of unspecified cerebral artery: Secondary | ICD-10-CM | POA: Diagnosis not present

## 2021-09-29 DIAGNOSIS — R059 Cough, unspecified: Secondary | ICD-10-CM | POA: Diagnosis not present

## 2021-09-29 DIAGNOSIS — N183 Chronic kidney disease, stage 3 unspecified: Secondary | ICD-10-CM | POA: Diagnosis not present

## 2021-09-29 DIAGNOSIS — T82524A Displacement of infusion catheter, initial encounter: Secondary | ICD-10-CM | POA: Diagnosis not present

## 2021-09-29 DIAGNOSIS — I76 Septic arterial embolism: Secondary | ICD-10-CM | POA: Diagnosis not present

## 2021-09-29 DIAGNOSIS — I82611 Acute embolism and thrombosis of superficial veins of right upper extremity: Secondary | ICD-10-CM | POA: Diagnosis not present

## 2021-09-29 DIAGNOSIS — D638 Anemia in other chronic diseases classified elsewhere: Secondary | ICD-10-CM | POA: Diagnosis not present

## 2021-09-29 DIAGNOSIS — R0602 Shortness of breath: Secondary | ICD-10-CM | POA: Diagnosis not present

## 2021-09-29 DIAGNOSIS — N400 Enlarged prostate without lower urinary tract symptoms: Secondary | ICD-10-CM | POA: Diagnosis not present

## 2021-09-29 DIAGNOSIS — M4802 Spinal stenosis, cervical region: Secondary | ICD-10-CM | POA: Diagnosis not present

## 2021-09-29 DIAGNOSIS — M47895 Other spondylosis, thoracolumbar region: Secondary | ICD-10-CM | POA: Diagnosis not present

## 2021-09-29 DIAGNOSIS — M549 Dorsalgia, unspecified: Secondary | ICD-10-CM | POA: Diagnosis not present

## 2021-09-29 DIAGNOSIS — R2681 Unsteadiness on feet: Secondary | ICD-10-CM | POA: Diagnosis not present

## 2021-09-29 DIAGNOSIS — N390 Urinary tract infection, site not specified: Secondary | ICD-10-CM | POA: Diagnosis not present

## 2021-09-29 DIAGNOSIS — N1832 Chronic kidney disease, stage 3b: Secondary | ICD-10-CM | POA: Diagnosis not present

## 2021-09-29 DIAGNOSIS — I639 Cerebral infarction, unspecified: Secondary | ICD-10-CM | POA: Diagnosis not present

## 2021-09-29 DIAGNOSIS — R350 Frequency of micturition: Secondary | ICD-10-CM | POA: Diagnosis not present

## 2021-09-29 DIAGNOSIS — R0989 Other specified symptoms and signs involving the circulatory and respiratory systems: Secondary | ICD-10-CM | POA: Diagnosis not present

## 2021-09-29 DIAGNOSIS — M545 Low back pain, unspecified: Secondary | ICD-10-CM | POA: Diagnosis not present

## 2021-09-29 DIAGNOSIS — C61 Malignant neoplasm of prostate: Secondary | ICD-10-CM | POA: Diagnosis not present

## 2021-09-29 DIAGNOSIS — I509 Heart failure, unspecified: Secondary | ICD-10-CM | POA: Diagnosis not present

## 2021-09-29 DIAGNOSIS — M7989 Other specified soft tissue disorders: Secondary | ICD-10-CM | POA: Diagnosis present

## 2021-09-29 DIAGNOSIS — R7401 Elevation of levels of liver transaminase levels: Secondary | ICD-10-CM | POA: Diagnosis not present

## 2021-09-29 DIAGNOSIS — Z8546 Personal history of malignant neoplasm of prostate: Secondary | ICD-10-CM | POA: Diagnosis not present

## 2021-09-29 DIAGNOSIS — R7881 Bacteremia: Secondary | ICD-10-CM | POA: Diagnosis not present

## 2021-09-29 DIAGNOSIS — M546 Pain in thoracic spine: Secondary | ICD-10-CM | POA: Diagnosis not present

## 2021-09-29 DIAGNOSIS — M6281 Muscle weakness (generalized): Secondary | ICD-10-CM | POA: Diagnosis not present

## 2021-09-29 DIAGNOSIS — R4182 Altered mental status, unspecified: Secondary | ICD-10-CM | POA: Diagnosis not present

## 2021-09-29 DIAGNOSIS — M6259 Muscle wasting and atrophy, not elsewhere classified, multiple sites: Secondary | ICD-10-CM | POA: Diagnosis not present

## 2021-09-29 DIAGNOSIS — K5900 Constipation, unspecified: Secondary | ICD-10-CM | POA: Diagnosis not present

## 2021-09-29 DIAGNOSIS — R69 Illness, unspecified: Secondary | ICD-10-CM | POA: Diagnosis not present

## 2021-09-29 DIAGNOSIS — E785 Hyperlipidemia, unspecified: Secondary | ICD-10-CM | POA: Diagnosis not present

## 2021-09-30 DIAGNOSIS — M6281 Muscle weakness (generalized): Secondary | ICD-10-CM | POA: Diagnosis not present

## 2021-09-30 DIAGNOSIS — R7881 Bacteremia: Secondary | ICD-10-CM | POA: Diagnosis not present

## 2021-09-30 DIAGNOSIS — I639 Cerebral infarction, unspecified: Secondary | ICD-10-CM | POA: Diagnosis not present

## 2021-09-30 DIAGNOSIS — I10 Essential (primary) hypertension: Secondary | ICD-10-CM | POA: Diagnosis not present

## 2021-10-01 DIAGNOSIS — I76 Septic arterial embolism: Secondary | ICD-10-CM | POA: Diagnosis not present

## 2021-10-01 DIAGNOSIS — R7881 Bacteremia: Secondary | ICD-10-CM | POA: Diagnosis not present

## 2021-10-01 DIAGNOSIS — M6281 Muscle weakness (generalized): Secondary | ICD-10-CM | POA: Diagnosis not present

## 2021-10-01 DIAGNOSIS — C61 Malignant neoplasm of prostate: Secondary | ICD-10-CM | POA: Diagnosis not present

## 2021-10-06 DIAGNOSIS — R0602 Shortness of breath: Secondary | ICD-10-CM | POA: Diagnosis not present

## 2021-10-06 DIAGNOSIS — R69 Illness, unspecified: Secondary | ICD-10-CM | POA: Diagnosis not present

## 2021-10-06 DIAGNOSIS — R7881 Bacteremia: Secondary | ICD-10-CM | POA: Diagnosis not present

## 2021-10-06 DIAGNOSIS — M6281 Muscle weakness (generalized): Secondary | ICD-10-CM | POA: Diagnosis not present

## 2021-10-07 DIAGNOSIS — I509 Heart failure, unspecified: Secondary | ICD-10-CM | POA: Diagnosis not present

## 2021-10-07 DIAGNOSIS — I10 Essential (primary) hypertension: Secondary | ICD-10-CM | POA: Diagnosis not present

## 2021-10-08 DIAGNOSIS — R69 Illness, unspecified: Secondary | ICD-10-CM | POA: Diagnosis not present

## 2021-10-08 DIAGNOSIS — R0602 Shortness of breath: Secondary | ICD-10-CM | POA: Diagnosis not present

## 2021-10-08 DIAGNOSIS — R7881 Bacteremia: Secondary | ICD-10-CM | POA: Diagnosis not present

## 2021-10-08 DIAGNOSIS — M6281 Muscle weakness (generalized): Secondary | ICD-10-CM | POA: Diagnosis not present

## 2021-10-09 DIAGNOSIS — I10 Essential (primary) hypertension: Secondary | ICD-10-CM | POA: Diagnosis not present

## 2021-10-10 DIAGNOSIS — I10 Essential (primary) hypertension: Secondary | ICD-10-CM | POA: Diagnosis not present

## 2021-10-12 DIAGNOSIS — I10 Essential (primary) hypertension: Secondary | ICD-10-CM | POA: Diagnosis not present

## 2021-10-12 DIAGNOSIS — M549 Dorsalgia, unspecified: Secondary | ICD-10-CM | POA: Diagnosis not present

## 2021-10-12 DIAGNOSIS — R7881 Bacteremia: Secondary | ICD-10-CM | POA: Diagnosis not present

## 2021-10-12 DIAGNOSIS — D638 Anemia in other chronic diseases classified elsewhere: Secondary | ICD-10-CM | POA: Diagnosis not present

## 2021-10-13 DIAGNOSIS — M549 Dorsalgia, unspecified: Secondary | ICD-10-CM | POA: Diagnosis not present

## 2021-10-13 DIAGNOSIS — I10 Essential (primary) hypertension: Secondary | ICD-10-CM | POA: Diagnosis not present

## 2021-10-13 DIAGNOSIS — R7881 Bacteremia: Secondary | ICD-10-CM | POA: Diagnosis not present

## 2021-10-16 ENCOUNTER — Emergency Department: Payer: No Typology Code available for payment source

## 2021-10-16 ENCOUNTER — Other Ambulatory Visit: Payer: Self-pay

## 2021-10-16 ENCOUNTER — Emergency Department: Payer: Self-pay

## 2021-10-16 ENCOUNTER — Emergency Department
Admission: EM | Admit: 2021-10-16 | Discharge: 2021-10-16 | Disposition: A | Discharge status: Home / Self Care | Payer: No Typology Code available for payment source | Attending: Family Medicine | Admitting: Family Medicine

## 2021-10-16 DIAGNOSIS — M549 Dorsalgia, unspecified: Secondary | ICD-10-CM | POA: Diagnosis not present

## 2021-10-16 DIAGNOSIS — R7881 Bacteremia: Secondary | ICD-10-CM | POA: Diagnosis not present

## 2021-10-16 DIAGNOSIS — M7989 Other specified soft tissue disorders: Secondary | ICD-10-CM | POA: Diagnosis present

## 2021-10-16 DIAGNOSIS — Z8546 Personal history of malignant neoplasm of prostate: Secondary | ICD-10-CM | POA: Diagnosis not present

## 2021-10-16 DIAGNOSIS — E876 Hypokalemia: Secondary | ICD-10-CM | POA: Diagnosis not present

## 2021-10-16 DIAGNOSIS — I82611 Acute embolism and thrombosis of superficial veins of right upper extremity: Secondary | ICD-10-CM

## 2021-10-16 DIAGNOSIS — I10 Essential (primary) hypertension: Secondary | ICD-10-CM | POA: Insufficient documentation

## 2021-10-16 DIAGNOSIS — T82524A Displacement of infusion catheter, initial encounter: Secondary | ICD-10-CM | POA: Diagnosis not present

## 2021-10-16 DIAGNOSIS — Z95828 Presence of other vascular implants and grafts: Secondary | ICD-10-CM

## 2021-10-16 LAB — CBC WITH DIFFERENTIAL/PLATELET
Abs Immature Granulocytes: 0.05 10*3/uL (ref 0.00–0.07)
Basophils Absolute: 0.1 10*3/uL (ref 0.0–0.1)
Basophils Relative: 1 %
Eosinophils Absolute: 0.1 10*3/uL (ref 0.0–0.5)
Eosinophils Relative: 1 %
HCT: 33.5 % — ABNORMAL LOW (ref 39.0–52.0)
Hemoglobin: 10 g/dL — ABNORMAL LOW (ref 13.0–17.0)
Immature Granulocytes: 1 %
Lymphocytes Relative: 19 %
Lymphs Abs: 1.8 10*3/uL (ref 0.7–4.0)
MCH: 29.9 pg (ref 26.0–34.0)
MCHC: 29.9 g/dL — ABNORMAL LOW (ref 30.0–36.0)
MCV: 100.3 fL — ABNORMAL HIGH (ref 80.0–100.0)
Monocytes Absolute: 0.9 10*3/uL (ref 0.1–1.0)
Monocytes Relative: 9 %
Neutro Abs: 6.6 10*3/uL (ref 1.7–7.7)
Neutrophils Relative %: 69 %
Platelets: 257 10*3/uL (ref 150–400)
RBC: 3.34 MIL/uL — ABNORMAL LOW (ref 4.22–5.81)
RDW: 18 % — ABNORMAL HIGH (ref 11.5–15.5)
WBC: 9.5 10*3/uL (ref 4.0–10.5)
nRBC: 0 % (ref 0.0–0.2)

## 2021-10-16 LAB — BASIC METABOLIC PANEL
Anion gap: 9 (ref 5–15)
BUN: 9 mg/dL (ref 8–23)
CO2: 29 mmol/L (ref 22–32)
Calcium: 8.3 mg/dL — ABNORMAL LOW (ref 8.9–10.3)
Chloride: 102 mmol/L (ref 98–111)
Creatinine, Ser: 1.08 mg/dL (ref 0.61–1.24)
GFR, Estimated: >60 mL/min (ref >60)
Glucose, Bld: 95 mg/dL (ref 70–99)
Potassium: 2.5 mmol/L — CL (ref 3.5–5.1)
Sodium: 140 mmol/L (ref 135–145)

## 2021-10-16 LAB — MAGNESIUM: Magnesium: 1.6 mg/dL — ABNORMAL LOW (ref 1.7–2.4)

## 2021-10-16 LAB — PROTIME-INR
INR: 1.1 (ref 0.8–1.2)
Prothrombin Time: 13.8 seconds (ref 11.4–15.2)

## 2021-10-16 MED ORDER — POTASSIUM CHLORIDE CRYS ER 20 MEQ PO TBCR: 20.0000 meq | EXTENDED_RELEASE_TABLET | Freq: Two times a day (BID) | ORAL | 0 refills | Status: DC

## 2021-10-16 MED ORDER — POTASSIUM CHLORIDE 10 MEQ/100ML IV SOLN
10.0000 meq | Freq: Once | INTRAVENOUS | Status: AC
  Administered 2021-10-16: 10 meq via INTRAVENOUS
  Filled 2021-10-16: qty 100

## 2021-10-16 MED ORDER — SODIUM CHLORIDE 0.9% FLUSH: 10.0000 mL | Freq: Two times a day (BID) | INTRAVENOUS | Status: DC

## 2021-10-16 MED ORDER — CEFAZOLIN SODIUM-DEXTROSE 2-4 GM/100ML-% IV SOLN
2.0000 g | Freq: Three times a day (TID) | INTRAVENOUS | Status: DC
  Administered 2021-10-16: 2 g via INTRAVENOUS
  Filled 2021-10-16: qty 100

## 2021-10-16 MED ORDER — HYDRALAZINE HCL 50 MG PO TABS
50.0000 mg | ORAL_TABLET | Freq: Once | ORAL | Status: AC
  Administered 2021-10-16: 50 mg via ORAL
  Filled 2021-10-16: qty 1

## 2021-10-16 MED ORDER — CHLORHEXIDINE GLUCONATE CLOTH 2 % EX PADS
6.0000 | MEDICATED_PAD | Freq: Every day | CUTANEOUS | Status: DC
  Filled 2021-10-16: qty 6

## 2021-10-16 MED ORDER — SODIUM CHLORIDE 0.9% FLUSH: 10.0000 mL | INTRAVENOUS | Status: DC | PRN

## 2021-10-16 MED ORDER — POTASSIUM CHLORIDE 10 MEQ/100ML IV SOLN
10.0000 meq | Freq: Once | INTRAVENOUS | Status: AC
  Administered 2021-10-16: 10 meq via INTRAVENOUS

## 2021-10-16 NOTE — Discharge Instructions (Addendum)
You have 2 non-occlusive, superficial thrombi in your RUE. They should resolve with rest, elevation, and warm compresses. You should have your RUE ultrasound repeated in 2-3 days to check progress. You have had your PICC line replaced. Continue with your q8 hour cefazolin infusions. Take the potassium pills as directed. Follow-up with your primary provider or return if needed.

## 2021-10-16 NOTE — Progress Notes (Signed)
Peripherally Inserted Central Catheter Placement  The IV Nurse has discussed with the patient and/or persons authorized to consent for the patient, the purpose of this procedure and the potential benefits and risks involved with this procedure.  The benefits include less needle sticks, lab draws from the catheter, and the patient may be discharged home with the catheter. Risks include, but not limited to, infection, bleeding, blood clot (thrombus formation), and puncture of an artery; nerve damage and irregular heartbeat and possibility to perform a PICC exchange if needed/ordered by physician.  Alternatives to this procedure were also discussed.  Bard Power PICC patient education guide, fact sheet on infection prevention and patient information card has been provided to patient /or left at bedside.  Telephone consent obtained from wife and witnessed by Gregary Signs, Therapist, sports.  PICC Placement Documentation  PICC Single Lumen 10/16/21 Left Brachial 47 cm 2 cm (Active)  Indication for Insertion or Continuance of Line Home intravenous therapies (PICC only) 10/16/21 2012  Exposed Catheter (cm) 2 cm 10/16/21 2012  Site Assessment Clean, Dry, Intact 10/16/21 2012  Line Status Saline locked;Blood return noted 10/16/21 2012  Dressing Type Transparent;Securing device 10/16/21 2012  Dressing Status Antimicrobial disc in place;Clean, Dry, Intact 10/16/21 2012  Safety Lock Not Applicable 56/25/63 8937  Line Care Connections checked and tightened 10/16/21 2012  Line Adjustment (NICU/IV Team Only) No 10/16/21 2012  Dressing Intervention New dressing 10/16/21 2012  Dressing Change Due 10/23/21 10/16/21 2012       Rosalio Macadamia Chenice 10/16/2021, 8:13 PM

## 2021-10-16 NOTE — Progress Notes (Signed)
Arrived to ED for PICC assessment. No PICC in place. RN made aware.

## 2021-10-16 NOTE — ED Notes (Signed)
Spoke with staff at [facility]. They report patients PICC line was taken out last night after becoming clotted. Staff stated they believe he has a blood clot in his right upper arm.

## 2021-10-16 NOTE — ED Notes (Signed)
Unable to straight stick for blood draw.Lab called for phleb

## 2021-10-16 NOTE — ED Notes (Signed)
Assisted patient with urinal.

## 2021-10-16 NOTE — ED Triage Notes (Signed)
Pt to ED via [facility] from [facility]. Pt sent for possible blood clot in PICC line. Pt disoriented x4. Unsure of baseline. EMS states BP 206/105. Pt on BP medications but unable to tell RN if he took it today.

## 2021-10-16 NOTE — ED Provider Notes (Signed)
[facility] Emergency Department Provider Note     None    (approximate)   History   blood clot   HPI  Patient unable to provide full history of present illness, secondary to his A&O x 2.  Mark [name] is a 81 y.o. male with a history of CKI, hyperkalemia, hypertension, and prostate cancer presents to the ED from [facility]. Patient present with swelling to the RUE. According to nursing staff, patient has a possible blood clot in his RUE s/p PICC line removal yesterday.  No report of patient's typical baseline although he presents disoriented to person, place, or time; and is non-verbal.  Patient with unclear report of administration of daily meds prior to arrival.  EMS reports elevated blood pressure of 206/105.    Physical Exam   Triage Vital Signs: ED Triage Vitals  Enc Vitals Group     BP 10/16/21 1257 (!) 181/87     Pulse Rate 10/16/21 1257 73     Resp 10/16/21 1257 16     Temp 10/16/21 1257 98.7 F (37.1 C)     Temp Source 10/16/21 1257 Oral     SpO2 10/16/21 1257 94 %     Weight 10/16/21 1430 192 lb 14.4 oz (87.5 kg)     Height 10/16/21 1430 '5\' 6"'$  (1.676 m)     Head Circumference --      [facility] Flow --      Pain Score --      Pain Loc --      Pain Edu? --      Excl. in [place]? --     Most recent vital signs: Vitals:   10/16/21 2130 10/16/21 2200  BP: (!) 183/156 (!) 195/89  Pulse: 69 72  Resp: 19   Temp:    SpO2: 94% 94%    General Awake, no distress.  CV:  Good peripheral perfusion. No significant edema to the RUE noted. No erythema, induration or warmth noted.  RESP:  Normal effort.  ABD:  No distention.    ED Results / Procedures / Treatments   Labs (all labs ordered are listed, but only abnormal results are displayed) Labs Reviewed  BASIC METABOLIC PANEL - Abnormal; Notable for the following components:      Result Value   Potassium 2.5 (*)    Calcium 8.3 (*)    All other components within normal limits  CBC  WITH DIFFERENTIAL/PLATELET - Abnormal; Notable for the following components:   RBC 3.34 (*)    Hemoglobin 10.0 (*)    HCT 33.5 (*)    MCV 100.3 (*)    MCHC 29.9 (*)    RDW 18.0 (*)    All other components within normal limits  MAGNESIUM - Abnormal; Notable for the following components:   Magnesium 1.6 (*)    All other components within normal limits  PROTIME-INR     EKG   RADIOLOGY  I personally viewed and evaluated these images as part of my medical decision making, as well as reviewing the written report by the radiologist.  ED Provider Interpretation: non-occlusive thrombi noted}  US Venous RUE  IMPRESSION: 1. Nonocclusive thrombus in the right axillary vein as well as nonocclusive thrombus in 1 the 2 paired brachial veins. 2. No other evidence of right upper extremity deep venous thrombosis   PROCEDURES:  Critical Care performed: No  Procedures   MEDICATIONS ORDERED IN ED: Medications  potassium chloride 10 mEq in 100 mL IVPB (  0 mEq Intravenous Stopped 10/16/21 2147)  hydrALAZINE (APRESOLINE) tablet 50 mg (50 mg Oral Given 10/16/21 2147)  potassium chloride 10 mEq in 100 mL IVPB (0 mEq Intravenous Stopped 10/16/21 2258)     IMPRESSION / MDM / ASSESSMENT AND PLAN / ED COURSE  I reviewed the triage vital signs and the nursing notes.                              Differential diagnosis includes, but is not limited to, upper extremity DVT, thrombophlebitis, cellulitis, hematoma  Patient's presentation is most consistent with acute complicated illness / injury requiring diagnostic workup.  Patient's diagnosis is consistent with superficial nonocclusive thrombus as confirmed by RUE ultrasound.  My review of the images concurs with the radiologist report.  Patient with need for continuous IV antibiotics due to a documented MRSA bacteremia, based on my review of the patient's chart and the [facility] provided by his facility.  The IV/PICC line RN was consulted and was  successful in placing a L UE PICC line.  Patient was also given IV potassium to correct his critical hypokalemia of 2.5.  He was also given a dose of his 2 g abscess order which is set for every 8 hours.  We considered admission for the patient given his need for peripheral access and ongoing IV antibiotic management.  After successful establishment of his PICC line, is stable for return to his facility as he can continue with his treatment at his SNF.  Patient will be discharged home with prescriptions for potassium. Patient is to follow up with his PCP as needed or otherwise directed. Patient is given ED precautions to return to the ED for any worsening or new symptoms.     FINAL CLINICAL IMPRESSION(S) / ED DIAGNOSES   Final diagnoses:  Acute embolism and thrombosis of superficial vein of right upper extremity  Hypokalemia  S/P PICC central line placement     Rx / DC Orders   ED Discharge Orders          Ordered    potassium chloride SA (KLOR-CON M) 20 MEQ tablet  2 times daily        10/16/21 2242             Note:  This document was prepared using Dragon voice recognition software and may include unintentional dictation errors.    Melvenia Needles, PA-C 10/22/21 1944    Lucrezia Starch, MD 10/22/21 2212

## 2021-10-16 NOTE — ED Notes (Signed)
Dc ppw provided, Pt to ambulance with EMS crew. Pt off unit

## 2021-10-16 NOTE — ED Triage Notes (Signed)
First nurse note: Patient arrived by EMS with c/o possible blood clot in PICC line. From [facility]. EMS 206/105 blood pressure.

## 2021-10-22 DIAGNOSIS — M549 Dorsalgia, unspecified: Secondary | ICD-10-CM | POA: Diagnosis not present

## 2021-10-22 DIAGNOSIS — R7881 Bacteremia: Secondary | ICD-10-CM | POA: Diagnosis not present

## 2021-10-22 DIAGNOSIS — I10 Essential (primary) hypertension: Secondary | ICD-10-CM | POA: Diagnosis not present

## 2021-10-26 DIAGNOSIS — I10 Essential (primary) hypertension: Secondary | ICD-10-CM | POA: Diagnosis not present

## 2021-10-30 DIAGNOSIS — R7881 Bacteremia: Secondary | ICD-10-CM | POA: Diagnosis not present

## 2021-10-30 DIAGNOSIS — C61 Malignant neoplasm of prostate: Secondary | ICD-10-CM | POA: Diagnosis not present

## 2021-10-30 DIAGNOSIS — I76 Septic arterial embolism: Secondary | ICD-10-CM | POA: Diagnosis not present

## 2021-10-30 DIAGNOSIS — M6281 Muscle weakness (generalized): Secondary | ICD-10-CM | POA: Diagnosis not present

## 2021-11-02 DIAGNOSIS — N1832 Chronic kidney disease, stage 3b: Secondary | ICD-10-CM | POA: Diagnosis not present

## 2021-11-02 DIAGNOSIS — R7881 Bacteremia: Secondary | ICD-10-CM | POA: Diagnosis not present

## 2021-11-02 DIAGNOSIS — I10 Essential (primary) hypertension: Secondary | ICD-10-CM | POA: Diagnosis not present

## 2021-11-02 DIAGNOSIS — D638 Anemia in other chronic diseases classified elsewhere: Secondary | ICD-10-CM | POA: Diagnosis not present

## 2021-11-04 DIAGNOSIS — R7881 Bacteremia: Secondary | ICD-10-CM | POA: Diagnosis not present

## 2021-11-04 DIAGNOSIS — I10 Essential (primary) hypertension: Secondary | ICD-10-CM | POA: Diagnosis not present

## 2021-11-09 DIAGNOSIS — I10 Essential (primary) hypertension: Secondary | ICD-10-CM | POA: Diagnosis not present

## 2021-11-10 DIAGNOSIS — R350 Frequency of micturition: Secondary | ICD-10-CM | POA: Diagnosis not present

## 2021-11-10 DIAGNOSIS — M6281 Muscle weakness (generalized): Secondary | ICD-10-CM | POA: Diagnosis not present

## 2021-11-12 DIAGNOSIS — R350 Frequency of micturition: Secondary | ICD-10-CM | POA: Diagnosis not present

## 2021-11-12 DIAGNOSIS — R7881 Bacteremia: Secondary | ICD-10-CM | POA: Diagnosis not present

## 2021-11-12 DIAGNOSIS — N1832 Chronic kidney disease, stage 3b: Secondary | ICD-10-CM | POA: Diagnosis not present

## 2021-11-12 DIAGNOSIS — D638 Anemia in other chronic diseases classified elsewhere: Secondary | ICD-10-CM | POA: Diagnosis not present

## 2021-11-18 DIAGNOSIS — C61 Malignant neoplasm of prostate: Secondary | ICD-10-CM | POA: Diagnosis not present

## 2021-11-18 DIAGNOSIS — M6281 Muscle weakness (generalized): Secondary | ICD-10-CM | POA: Diagnosis not present

## 2021-11-18 DIAGNOSIS — I76 Septic arterial embolism: Secondary | ICD-10-CM | POA: Diagnosis not present

## 2021-11-18 DIAGNOSIS — R7881 Bacteremia: Secondary | ICD-10-CM | POA: Diagnosis not present

## 2021-11-19 DIAGNOSIS — I10 Essential (primary) hypertension: Secondary | ICD-10-CM | POA: Diagnosis not present

## 2021-11-19 DIAGNOSIS — R2681 Unsteadiness on feet: Secondary | ICD-10-CM | POA: Diagnosis not present

## 2021-11-20 DIAGNOSIS — N183 Chronic kidney disease, stage 3 unspecified: Secondary | ICD-10-CM | POA: Diagnosis not present

## 2021-11-22 ENCOUNTER — Other Ambulatory Visit: Payer: Self-pay

## 2021-11-22 ENCOUNTER — Emergency Department
Admission: EM | Admit: 2021-11-22 | Discharge: 2021-11-22 | Disposition: A | Discharge status: Home / Self Care | Payer: No Typology Code available for payment source | Attending: Emergency Medicine | Admitting: Emergency Medicine

## 2021-11-22 ENCOUNTER — Encounter: Payer: Self-pay | Admitting: Emergency Medicine

## 2021-11-22 DIAGNOSIS — I10 Essential (primary) hypertension: Secondary | ICD-10-CM | POA: Insufficient documentation

## 2021-11-22 DIAGNOSIS — Z8546 Personal history of malignant neoplasm of prostate: Secondary | ICD-10-CM | POA: Diagnosis not present

## 2021-11-22 DIAGNOSIS — M5417 Radiculopathy, lumbosacral region: Secondary | ICD-10-CM | POA: Insufficient documentation

## 2021-11-22 DIAGNOSIS — M549 Dorsalgia, unspecified: Secondary | ICD-10-CM | POA: Diagnosis not present

## 2021-11-22 DIAGNOSIS — R52 Pain, unspecified: Secondary | ICD-10-CM | POA: Diagnosis not present

## 2021-11-22 LAB — CBC
HCT: 32 % — ABNORMAL LOW (ref 39.0–52.0)
Hemoglobin: 9.9 g/dL — ABNORMAL LOW (ref 13.0–17.0)
MCH: 28.4 pg (ref 26.0–34.0)
MCHC: 30.9 g/dL (ref 30.0–36.0)
MCV: 92 fL (ref 80.0–100.0)
Platelets: 298 10*3/uL (ref 150–400)
RBC: 3.48 MIL/uL — ABNORMAL LOW (ref 4.22–5.81)
RDW: 13.1 % (ref 11.5–15.5)
WBC: 8.5 10*3/uL (ref 4.0–10.5)
nRBC: 0 % (ref 0.0–0.2)

## 2021-11-22 LAB — URINALYSIS, ROUTINE W REFLEX MICROSCOPIC
Bilirubin Urine: NEGATIVE
Glucose, UA: NEGATIVE mg/dL
Hgb urine dipstick: NEGATIVE
Ketones, ur: NEGATIVE mg/dL
Leukocytes,Ua: NEGATIVE
Nitrite: NEGATIVE
Protein, ur: NEGATIVE mg/dL
Specific Gravity, Urine: 1.01 (ref 1.005–1.030)
pH: 7 (ref 5.0–8.0)

## 2021-11-22 LAB — BASIC METABOLIC PANEL
Anion gap: 6 (ref 5–15)
BUN: 13 mg/dL (ref 8–23)
CO2: 25 mmol/L (ref 22–32)
Calcium: 8.9 mg/dL (ref 8.9–10.3)
Chloride: 110 mmol/L (ref 98–111)
Creatinine, Ser: 1.25 mg/dL — ABNORMAL HIGH (ref 0.61–1.24)
GFR, Estimated: 58 mL/min — ABNORMAL LOW (ref >60)
Glucose, Bld: 96 mg/dL (ref 70–99)
Potassium: 4.3 mmol/L (ref 3.5–5.1)
Sodium: 141 mmol/L (ref 135–145)

## 2021-11-22 MED ORDER — GABAPENTIN 100 MG PO CAPS: 100.0000 mg | ORAL_CAPSULE | Freq: Every day | ORAL | 1 refills | Status: DC

## 2021-11-22 NOTE — ED Notes (Signed)
See triage note  Presents with right lower back pain  States pain started this am  and is moving into right leg  Denies any previous falls

## 2021-11-22 NOTE — ED Provider Notes (Signed)
[facility] Emergency Department Provider Note     Event Date/Time   First MD Initiated Contact with Patient 11/22/21 1112     (approximate)   History   Flank Pain   HPI  Mark [name] is a 81 y.o. male with a history of hypertension, prostate cancer, AKI, hypokalemia presents to the ED via EMS from home.  Patient recently discharged from [facility].  He presents today with reports of right-sided back pain with some referral into his right leg.  He also notes some urinary frequency.  He denies any fevers, chills, or sweats.  He denies any acute pain at the time of this presentation.     Physical Exam   Triage Vital Signs: ED Triage Vitals  Enc Vitals Group     BP 11/22/21 1026 (!) 139/92     Pulse Rate 11/22/21 1026 68     Resp 11/22/21 1026 18     Temp 11/22/21 1026 99.3 F (37.4 C)     Temp Source 11/22/21 1026 Oral     SpO2 11/22/21 1026 100 %     Weight 11/22/21 1027 200 lb (90.7 kg)     Height 11/22/21 1027 5' 6.5" (1.689 m)     Head Circumference --      [facility] Flow --      Pain Score 11/22/21 1027 0     Pain Loc --      Pain Edu? --      Excl. in [place]? --     Most recent vital signs: Vitals:   11/22/21 1026  BP: (!) 139/92  Pulse: 68  Resp: 18  Temp: 99.3 F (37.4 C)  SpO2: 100%    General Awake, no distress. NAD CV:  Good peripheral perfusion.  RESP:  Normal effort.  ABD:  No distention.  MSK:  Normal hip flexion extension range noted.   ED Results / Procedures / Treatments   Labs (all labs ordered are listed, but only abnormal results are displayed) Labs Reviewed  URINALYSIS, ROUTINE W REFLEX MICROSCOPIC - Abnormal; Notable for the following components:      Result Value   Color, Urine YELLOW (*)    APPearance CLEAR (*)    All other components within normal limits  BASIC METABOLIC PANEL - Abnormal; Notable for the following components:   Creatinine, Ser 1.25 (*)    GFR, Estimated 58 (*)    All other  components within normal limits  CBC - Abnormal; Notable for the following components:   RBC 3.48 (*)    Hemoglobin 9.9 (*)    HCT 32.0 (*)    All other components within normal limits    EKG   RADIOLOGY   No results found.   PROCEDURES:  Critical Care performed: No  Procedures   MEDICATIONS ORDERED IN ED: Medications - No data to display   IMPRESSION / MDM / [place] / ED COURSE  I reviewed the triage vital signs and the nursing notes.                              Differential diagnosis includes, but is not limited to, sciatica, lumbar DDD, UTI, pyelonephritis  Patient's presentation is most consistent with acute complicated illness / injury requiring diagnostic workup.  Geriatric patient to the ED for evaluation of acute on chronic right-sided low back pain with referral to the right lower extremity.  Patient presents  in no acute distress.  He is evaluated for his complaints in the ED.  No reports of any recent injury, trauma, fall.  Patient presents with resolved symptoms from home via EMS.  Exam is reassuring as it shows no acute neuro muscle deficit.  No red flags on exam.  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  I suspect the patient's diagnosis is consistent with his known history of sciatica and lumbar radiculopathy. Patient will be discharged home with prescriptions for gabapentin. Patient is to follow up with primary provider next week as needed or otherwise directed. Patient is given ED precautions to return to the ED for any worsening or new symptoms.     FINAL CLINICAL IMPRESSION(S) / ED DIAGNOSES   Final diagnoses:  Lumbosacral radiculopathy     Rx / DC Orders   ED Discharge Orders          Ordered    gabapentin (NEURONTIN) 100 MG capsule  Daily at bedtime        11/22/21 1314             Note:  This  document was prepared using Dragon voice recognition software and may include unintentional dictation errors.    Melvenia Needles, PA-C 11/22/21 1334    Delman Kitten, MD 11/22/21 1756

## 2021-11-22 NOTE — ED Triage Notes (Signed)
Pt via EMS from home. Pt was recently discharged from the [facility]. Pt was having R sided back pain that radiates down his R leg. Pt states pt been having some urinary frequency. Pt denies any pain at this time. Pt is A&Ox4 and NAD

## 2021-11-22 NOTE — ED Triage Notes (Signed)
First Nurse Note:  Pt via EMS from home. Pt c/o R sided lower back pain and goes down to R thigh. Pt is A&Ox4 and NAD  184/90 78 HR 18 RR 109 CBG

## 2021-11-22 NOTE — ED Triage Notes (Signed)
Lab called for assistance with blood draw 

## 2021-11-22 NOTE — Discharge Instructions (Addendum)
Your exam and labs are normal at this time.  Your symptoms are likely due to nerve irritation from the lower back.  Continue with your home medicines as prescribed.  Start the gabapentin (nerve pain medicine) each night for the next week.  You may increase the medicine to 2 or 3 times daily over the next several weeks.  Follow-up with your primary provider as discussed.

## 2021-11-24 ENCOUNTER — Emergency Department: Payer: No Typology Code available for payment source

## 2021-11-24 ENCOUNTER — Other Ambulatory Visit: Payer: Self-pay

## 2021-11-24 ENCOUNTER — Inpatient Hospital Stay
Admission: EM | Admit: 2021-11-24 | Discharge: 2021-12-02 | DRG: 064 | Disposition: A | Discharge status: Home Health | Payer: No Typology Code available for payment source | Attending: Internal Medicine | Admitting: Internal Medicine

## 2021-11-24 ENCOUNTER — Inpatient Hospital Stay: Payer: No Typology Code available for payment source

## 2021-11-24 ENCOUNTER — Inpatient Hospital Stay (HOSPITAL_COMMUNITY)
Admit: 2021-11-24 | Discharge: 2021-11-24 | Disposition: A | Discharge status: Home / Self Care | Payer: No Typology Code available for payment source | Attending: Internal Medicine | Admitting: Internal Medicine

## 2021-11-24 ENCOUNTER — Encounter: Payer: Self-pay | Admitting: Emergency Medicine

## 2021-11-24 DIAGNOSIS — M5416 Radiculopathy, lumbar region: Secondary | ICD-10-CM | POA: Diagnosis present

## 2021-11-24 DIAGNOSIS — R9389 Abnormal findings on diagnostic imaging of other specified body structures: Secondary | ICD-10-CM | POA: Diagnosis not present

## 2021-11-24 DIAGNOSIS — I82433 Acute embolism and thrombosis of popliteal vein, bilateral: Secondary | ICD-10-CM | POA: Diagnosis not present

## 2021-11-24 DIAGNOSIS — I129 Hypertensive chronic kidney disease with stage 1 through stage 4 chronic kidney disease, or unspecified chronic kidney disease: Secondary | ICD-10-CM | POA: Diagnosis present

## 2021-11-24 DIAGNOSIS — I639 Cerebral infarction, unspecified: Secondary | ICD-10-CM | POA: Diagnosis present

## 2021-11-24 DIAGNOSIS — I2510 Atherosclerotic heart disease of native coronary artery without angina pectoris: Secondary | ICD-10-CM | POA: Diagnosis present

## 2021-11-24 DIAGNOSIS — N179 Acute kidney failure, unspecified: Secondary | ICD-10-CM | POA: Diagnosis not present

## 2021-11-24 DIAGNOSIS — Z8546 Personal history of malignant neoplasm of prostate: Secondary | ICD-10-CM | POA: Diagnosis not present

## 2021-11-24 DIAGNOSIS — I82413 Acute embolism and thrombosis of femoral vein, bilateral: Secondary | ICD-10-CM | POA: Diagnosis present

## 2021-11-24 DIAGNOSIS — D649 Anemia, unspecified: Secondary | ICD-10-CM | POA: Diagnosis not present

## 2021-11-24 DIAGNOSIS — Z8673 Personal history of transient ischemic attack (TIA), and cerebral infarction without residual deficits: Secondary | ICD-10-CM

## 2021-11-24 DIAGNOSIS — G8929 Other chronic pain: Secondary | ICD-10-CM | POA: Diagnosis present

## 2021-11-24 DIAGNOSIS — Z8619 Personal history of other infectious and parasitic diseases: Secondary | ICD-10-CM

## 2021-11-24 DIAGNOSIS — Z8249 Family history of ischemic heart disease and other diseases of the circulatory system: Secondary | ICD-10-CM | POA: Diagnosis not present

## 2021-11-24 DIAGNOSIS — I6389 Other cerebral infarction: Secondary | ICD-10-CM | POA: Diagnosis not present

## 2021-11-24 DIAGNOSIS — N189 Chronic kidney disease, unspecified: Secondary | ICD-10-CM | POA: Diagnosis not present

## 2021-11-24 DIAGNOSIS — I63532 Cerebral infarction due to unspecified occlusion or stenosis of left posterior cerebral artery: Principal | ICD-10-CM | POA: Diagnosis present

## 2021-11-24 DIAGNOSIS — Z87898 Personal history of other specified conditions: Secondary | ICD-10-CM

## 2021-11-24 DIAGNOSIS — I82443 Acute embolism and thrombosis of tibial vein, bilateral: Secondary | ICD-10-CM | POA: Diagnosis present

## 2021-11-24 DIAGNOSIS — Z923 Personal history of irradiation: Secondary | ICD-10-CM | POA: Diagnosis not present

## 2021-11-24 DIAGNOSIS — R55 Syncope and collapse: Secondary | ICD-10-CM | POA: Diagnosis not present

## 2021-11-24 DIAGNOSIS — G9341 Metabolic encephalopathy: Secondary | ICD-10-CM | POA: Diagnosis present

## 2021-11-24 DIAGNOSIS — I959 Hypotension, unspecified: Secondary | ICD-10-CM | POA: Diagnosis not present

## 2021-11-24 DIAGNOSIS — D631 Anemia in chronic kidney disease: Secondary | ICD-10-CM | POA: Diagnosis present

## 2021-11-24 DIAGNOSIS — N1831 Chronic kidney disease, stage 3a: Secondary | ICD-10-CM | POA: Diagnosis present

## 2021-11-24 DIAGNOSIS — I82453 Acute embolism and thrombosis of peroneal vein, bilateral: Secondary | ICD-10-CM | POA: Diagnosis present

## 2021-11-24 DIAGNOSIS — R29707 NIHSS score 7: Secondary | ICD-10-CM | POA: Diagnosis present

## 2021-11-24 DIAGNOSIS — M5136 Other intervertebral disc degeneration, lumbar region: Secondary | ICD-10-CM | POA: Diagnosis present

## 2021-11-24 DIAGNOSIS — Z79899 Other long term (current) drug therapy: Secondary | ICD-10-CM | POA: Diagnosis not present

## 2021-11-24 DIAGNOSIS — E7800 Pure hypercholesterolemia, unspecified: Secondary | ICD-10-CM | POA: Diagnosis present

## 2021-11-24 DIAGNOSIS — R531 Weakness: Secondary | ICD-10-CM | POA: Diagnosis not present

## 2021-11-24 DIAGNOSIS — I82409 Acute embolism and thrombosis of unspecified deep veins of unspecified lower extremity: Secondary | ICD-10-CM

## 2021-11-24 HISTORY — DX: Cerebral infarction, unspecified: I63.9

## 2021-11-24 LAB — APTT: aPTT: 31 seconds (ref 24–36)

## 2021-11-24 LAB — URINALYSIS, ROUTINE W REFLEX MICROSCOPIC
Bacteria, UA: NONE SEEN
Bilirubin Urine: NEGATIVE
Glucose, UA: NEGATIVE mg/dL
Ketones, ur: NEGATIVE mg/dL
Leukocytes,Ua: NEGATIVE
Nitrite: NEGATIVE
Protein, ur: 30 mg/dL — AB
Specific Gravity, Urine: 1.01 (ref 1.005–1.030)
pH: 6 (ref 5.0–8.0)

## 2021-11-24 LAB — BASIC METABOLIC PANEL
Anion gap: 10 (ref 5–15)
BUN: 20 mg/dL (ref 8–23)
CO2: 25 mmol/L (ref 22–32)
Calcium: 9.5 mg/dL (ref 8.9–10.3)
Chloride: 105 mmol/L (ref 98–111)
Creatinine, Ser: 1.65 mg/dL — ABNORMAL HIGH (ref 0.61–1.24)
GFR, Estimated: 42 mL/min — ABNORMAL LOW (ref >60)
Glucose, Bld: 100 mg/dL — ABNORMAL HIGH (ref 70–99)
Potassium: 4.2 mmol/L (ref 3.5–5.1)
Sodium: 140 mmol/L (ref 135–145)

## 2021-11-24 LAB — CBC
HCT: 34.8 % — ABNORMAL LOW (ref 39.0–52.0)
Hemoglobin: 10.5 g/dL — ABNORMAL LOW (ref 13.0–17.0)
MCH: 28 pg (ref 26.0–34.0)
MCHC: 30.2 g/dL (ref 30.0–36.0)
MCV: 92.8 fL (ref 80.0–100.0)
Platelets: 313 10*3/uL (ref 150–400)
RBC: 3.75 MIL/uL — ABNORMAL LOW (ref 4.22–5.81)
RDW: 13.6 % (ref 11.5–15.5)
WBC: 10.1 10*3/uL (ref 4.0–10.5)
nRBC: 0 % (ref 0.0–0.2)

## 2021-11-24 LAB — D-DIMER, QUANTITATIVE: D-Dimer, Quant: 2.75 ug/mL-FEU — ABNORMAL HIGH (ref 0.00–0.50)

## 2021-11-24 LAB — LIPID PANEL
Cholesterol: 190 mg/dL (ref 0–200)
HDL: 61 mg/dL (ref >40)
LDL Cholesterol: 99 mg/dL (ref 0–99)
Total CHOL/HDL Ratio: 3.1 RATIO
Triglycerides: 150 mg/dL — ABNORMAL HIGH (ref <150)
VLDL: 30 mg/dL (ref 0–40)

## 2021-11-24 LAB — PROTIME-INR
INR: 1 (ref 0.8–1.2)
Prothrombin Time: 13 seconds (ref 11.4–15.2)

## 2021-11-24 LAB — HEMOGLOBIN A1C
Hgb A1c MFr Bld: 3.8 % — ABNORMAL LOW (ref 4.8–5.6)
Mean Plasma Glucose: 62.36 mg/dL

## 2021-11-24 LAB — TROPONIN I (HIGH SENSITIVITY): Troponin I (High Sensitivity): 13 ng/L (ref <18)

## 2021-11-24 MED ORDER — ACETAMINOPHEN 325 MG PO TABS: 650.0000 mg | ORAL_TABLET | ORAL | Status: DC | PRN

## 2021-11-24 MED ORDER — SODIUM CHLORIDE 0.9 % IV SOLN: INTRAVENOUS | Status: DC

## 2021-11-24 MED ORDER — STROKE: EARLY STAGES OF RECOVERY BOOK: Freq: Once | Status: AC

## 2021-11-24 MED ORDER — GABAPENTIN 100 MG PO CAPS
100.0000 mg | ORAL_CAPSULE | Freq: Every day | ORAL | Status: DC
  Administered 2021-11-24 – 2021-12-01 (×8): 100 mg via ORAL
  Filled 2021-11-24 (×8): qty 1

## 2021-11-24 MED ORDER — ACETAMINOPHEN 160 MG/5ML PO SOLN: 650.0000 mg | ORAL | Status: DC | PRN

## 2021-11-24 MED ORDER — LACTATED RINGERS IV BOLUS
1000.0000 mL | Freq: Once | INTRAVENOUS | Status: AC
  Administered 2021-11-24: 1000 mL via INTRAVENOUS

## 2021-11-24 MED ORDER — ROSUVASTATIN CALCIUM 20 MG PO TABS
20.0000 mg | ORAL_TABLET | Freq: Every day | ORAL | Status: DC
Start: 2021-11-24 — End: 2021-11-25
  Administered 2021-11-24 – 2021-11-25 (×2): 20 mg via ORAL
  Filled 2021-11-24 (×2): qty 1

## 2021-11-24 MED ORDER — OXYCODONE-ACETAMINOPHEN 5-325 MG PO TABS
1.0000 | ORAL_TABLET | Freq: Four times a day (QID) | ORAL | Status: DC | PRN
  Administered 2021-11-24 – 2021-11-30 (×6): 1 via ORAL
  Filled 2021-11-24 (×6): qty 1

## 2021-11-24 MED ORDER — HYDRALAZINE HCL 20 MG/ML IJ SOLN
10.0000 mg | INTRAMUSCULAR | Status: DC | PRN
  Administered 2021-11-27: 10 mg via INTRAVENOUS
  Filled 2021-11-24: qty 1

## 2021-11-24 MED ORDER — ASPIRIN 81 MG PO TBEC
81.0000 mg | DELAYED_RELEASE_TABLET | Freq: Every day | ORAL | Status: DC
  Administered 2021-11-24 – 2021-12-02 (×9): 81 mg via ORAL
  Filled 2021-11-24 (×9): qty 1

## 2021-11-24 MED ORDER — TERAZOSIN HCL 5 MG PO CAPS
5.0000 mg | ORAL_CAPSULE | Freq: Every day | ORAL | Status: DC
Start: 2021-11-24 — End: 2021-12-02
  Administered 2021-11-24 – 2021-12-01 (×8): 5 mg via ORAL
  Filled 2021-11-24 (×8): qty 1

## 2021-11-24 MED ORDER — LIDOCAINE 5 % EX OINT
1.0000 | TOPICAL_OINTMENT | CUTANEOUS | Status: DC | PRN
Start: 2021-11-24 — End: 2021-12-02

## 2021-11-24 MED ORDER — ACETAMINOPHEN 650 MG RE SUPP: 650.0000 mg | RECTAL | Status: DC | PRN

## 2021-11-24 MED ORDER — ENOXAPARIN SODIUM 40 MG/0.4ML IJ SOSY
40.0000 mg | PREFILLED_SYRINGE | INTRAMUSCULAR | Status: DC
  Administered 2021-11-24: 40 mg via SUBCUTANEOUS
  Filled 2021-11-24: qty 0.4

## 2021-11-24 NOTE — ED Provider Notes (Signed)
[facility] Provider Note    Event Date/Time   First MD Initiated Contact with Patient 11/24/21 1115     (approximate)   History   Chief Complaint Loss of Consciousness   HPI  Mark [name] is a 81 y.o. male with past medical history of hypertension, hyperlipidemia, CAD, CKD, and stroke who presents to the ED for syncope.  History is limited as patient does not remember exactly what happened.  Per patient's wife, he woke up confused and disoriented this morning, was referring to her as his sister and did not know where he was.  She was able to get him to sit down for breakfast, but while eating breakfast his eyes rolled back into his head and he became unresponsive.  This lasted for about 30 seconds.  Afterwards he seemed confused, but wife denies any stiffness or shaking, patient did not have any tongue biting or urinary incontinence.  She states that he now seems better but still slightly confused.  He has otherwise been feeling well recently and denies any fevers, cough, chest pain, or shortness of breath.  Wife does state that he seemed to be gasping for air during the episode.     Physical Exam   Triage Vital Signs: ED Triage Vitals  Enc Vitals Group     BP 11/24/21 0935 (!) 95/56     Pulse Rate 11/24/21 0935 79     Resp 11/24/21 0935 18     Temp 11/24/21 0935 98.6 F (37 C)     Temp Source 11/24/21 0935 Oral     SpO2 11/24/21 0935 99 %     Weight 11/24/21 0932 190 lb (86.2 kg)     Height 11/24/21 0932 '5\' 6"'$  (1.676 m)     Head Circumference --      [facility] Flow --      Pain Score 11/24/21 0932 0     Pain Loc --      Pain Edu? --      Excl. in [place]? --     Most recent vital signs: Vitals:   11/24/21 0935  BP: (!) 95/56  Pulse: 79  Resp: 18  Temp: 98.6 F (37 C)  SpO2: 99%    Constitutional: Alert and oriented to person and place, but not time or situation. Eyes: Conjunctivae are normal. Head: Atraumatic. Nose: No  congestion/rhinnorhea. Mouth/Throat: Mucous membranes are moist.  Neck: No midline cervical spine tenderness to palpation. Cardiovascular: Normal rate, regular rhythm. Grossly normal heart sounds.  2+ radial pulses bilaterally. Respiratory: Normal respiratory effort.  No retractions. Lungs CTAB. Gastrointestinal: Soft and nontender. No distention. Musculoskeletal: No lower extremity tenderness nor edema.  Neurologic:  Normal speech and language.  5-5 strength in bilateral upper extremities, 4+ out of 5 strength in left lower extremity, 5/5 strength in right lower extremity.    ED Results / Procedures / Treatments   Labs (all labs ordered are listed, but only abnormal results are displayed) Labs Reviewed  BASIC METABOLIC PANEL - Abnormal; Notable for the following components:      Result Value   Glucose, Bld 100 (*)    Creatinine, Ser 1.65 (*)    GFR, Estimated 42 (*)    All other components within normal limits  CBC - Abnormal; Notable for the following components:   RBC 3.75 (*)    Hemoglobin 10.5 (*)    HCT 34.8 (*)    All other components within normal limits  URINALYSIS, ROUTINE W REFLEX  MICROSCOPIC  PROTIME-INR  APTT  CBG MONITORING, ED  TROPONIN I (HIGH SENSITIVITY)     EKG  ED ECG REPORT I, Blake Divine, the attending physician, personally viewed and interpreted this ECG.   Date: 11/24/2021  EKG Time: 9:33  Rate: 76  Rhythm: normal sinus rhythm  Axis: LAD  Intervals:none  ST&T Change: None  RADIOLOGY CT head reviewed and interpreted by me with hypodensity in the left occipital lobe concerning for stroke, no hemorrhage or midline shift noted.  PROCEDURES:  Critical Care performed: No  Procedures   MEDICATIONS ORDERED IN ED: Medications  lactated ringers bolus 1,000 mL (1,000 mLs Intravenous New Bag/Given 11/24/21 1403)     IMPRESSION / MDM / ASSESSMENT AND PLAN / ED COURSE  I reviewed the triage vital signs and the nursing notes.                               81 y.o. male with past medical history of hypertension, hyperlipidemia, CAD, CKD, and stroke who presents to the ED for confusion since this morning with syncopal episode while eating breakfast.  Patient's presentation is most consistent with acute presentation with potential threat to life or bodily function.  Differential diagnosis includes, but is not limited to, ACS, arrhythmia, electrolyte abnormality, AKI, dehydration, UTI, stroke, TIA, seizure.  Patient nontoxic-appearing and in no acute distress, vital signs remarkable for borderline low blood pressure but otherwise reassuring.  Patient confused here in the ED, also appears to have subtle weakness in his left lower extremity.  Unclear time of onset for this patient would not be a candidate for tPA, however we will check CT head for evidence of stroke or other intracranial process.  No traumatic injury associated with his syncopal episode.  EKG shows no evidence of arrhythmia or ischemia, we will add on troponin.  There may be an element of dehydration as wife states patient has not been drinking as much water as he should, labs remarkable for mild AKI without significant electrolyte abnormality.  Other labs so far reassuring with no significant anemia compared to previous, no significant leukocytosis.  Urinalysis is pending at this time.  CT head is concerning for occipital stroke, which is likely the source of patient's confusion.  This appears subacute and patient is outside window for acute intervention, no findings to suggest large vessel occlusion at this time.  Troponin within normal limits.  Case discussed with neurology, who will see the patient in consultation, case discussed with hospitalist for admission.      FINAL CLINICAL IMPRESSION(S) / ED DIAGNOSES   Final diagnoses:  Cerebrovascular accident (CVA), unspecified mechanism ([place])  Syncope and collapse  AKI (acute kidney injury) ([place])     Rx / DC Orders    ED Discharge Orders     None        Note:  This document was prepared using Dragon voice recognition software and may include unintentional dictation errors.   Blake Divine, MD 11/24/21 [phone]

## 2021-11-24 NOTE — ED Triage Notes (Signed)
Patient to ED via [facility] from home for syncopal episode. Per EMS, patient was eating breakfast and this eyes rolled in the back of his head. Patient states "his legs aren't working like normal today." Hx of stroke. Patient denies weakness or numbness.

## 2021-11-24 NOTE — H&P (Signed)
History and Physical    Patient: Mark [name] DQQ:[id number] DOB: 04/04/1941 DOA: 11/24/2021 DOS: the patient was seen and examined on 11/24/2021 PCP: Donald Prose, MD  Patient coming from: Home via EMS  Chief Complaint:  Chief Complaint  Patient presents with   Loss of Consciousness   HPI: Mark [name] is a 81 y.o. 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  History is obtained mostly from his wife over the phone as the patient reports that he is here due to right-sided back pain and the year is 72.  This morning around 2 AM the patient had said he needed to go to the restroom, but was unable to get up.  He had been sleeping in his a lift chair as his back have been giving him trouble for quite some time.  His wife had given him a urinal to use and he had gone back to sleep.  He woke up 2 hours later and was stating that he had the get up.  She attempted to help him up, but was unable.  A family friend was able to get him up to the wheelchair later on that morning.  His wife states that the was eating breakfast and the next thing she noticed was his head was laid back and his eyes were rolled in the back of his head. She went to check on him and he did not respond at first.  He did not respond for 30 seconds or so, and then was just staring at her and seem like he was having trouble breathing.  She called EMS at that time.  While she was here at the hospital patient had thought that she was his sister then thought that she was her mother for some time.  He had last been hospitalized from 6/5-6/13 at [facility] where he was noted to have multifocal embolic infarcts related to MSSA bacteremia with a mitral valve vegetation.  A PICC line was placed  and he was treated with nafcillin.  His wife notes that he completed the antibiotics on 7/26.  Following the stroke patient had issues with his vision, but his wife reported no other deficits.  Upon admission into the emergency department patient was seen as a code stroke.  CT noted a hypodensity of the left occipital and inferior parietal lobe which were new from prior exam concerning for acute and/or subacute infarcts with a possible area of petechial hemorrhage.  His blood pressure 95/56 and all other vital signs maintained.  Patient was out of the window for tPA. Chest x-ray noted 1.4 cm nodular opacity projecting over the anterior distal thoracic spine which could represent a pulmonary nodule versus overlapping osseous structures.   Patient has been given 1 L of lactated Ringer's.  Review of Systems: As mentioned in the history of present illness. All other systems reviewed and are negative, but limited as patient is acutely confused. Past Medical History:  Diagnosis Date   Arthritis    Lower Back, Hips, and Hands.   Cancer [facility])    Prostate CA s/o Rad Tx   Chronic kidney disease    Coronary artery disease    Hypercholesterolemia    Hypertension    Radiation 09/30/2008-12/02/2010   7800 cGy  Dr.Ottelin   Stroke ([place])  Past Surgical History:  Procedure Laterality Date   CATARACT SURGERY     KNEE ARTHROSCOPY Left    QUADRICEPS TENDON REPAIR Right 12/15/2020   Procedure: REPAIR QUADRICEP TENDON;  Surgeon: Lovell Sheehan, MD;  Location: [facility] ORS;  Service: Orthopedics;  Laterality: Right;   Social History:  reports that he has never smoked. He has never used smokeless tobacco. He reports that he does not drink alcohol and does not use drugs.  No Known Allergies  Family History  Problem Relation Age of Onset   Hypertension Father    Healthy Sister     Prior to Admission medications   Medication Sig Start Date End Date Taking? Authorizing Provider  amLODipine (NORVASC) 10 MG  tablet Take 1 tablet (10 mg total) by mouth daily. 09/18/21   Lorella Nimrod, MD  docusate sodium (COLACE) 100 MG capsule Take 1 capsule (100 mg total) by mouth daily as needed. 12/15/20 12/15/21  Lovell Sheehan, MD  gabapentin (NEURONTIN) 100 MG capsule Take 1 capsule (100 mg total) by mouth at bedtime. 11/22/21 01/21/22  Menshew, Dannielle Karvonen, PA-C  hydrALAZINE (APRESOLINE) 10 MG tablet Take 20 mg by mouth 3 (three) times daily.    [provider]  lidocaine (XYLOCAINE) 5 % ointment Apply 1 application topically as needed for moderate pain.    [provider]  lisinopril (PRINIVIL,ZESTRIL) 40 MG tablet Take 1 tablet (40 mg total) by mouth daily. 05/04/16   Roselee Nova, MD  potassium chloride SA (KLOR-CON M) 20 MEQ tablet Take 1 tablet (20 mEq total) by mouth 2 (two) times daily for 5 days. 10/16/21 10/21/21  Menshew, Dannielle Karvonen, PA-C  rosuvastatin (CRESTOR) 20 MG tablet Take 20 mg by mouth daily. 11/27/20   [provider]  terazosin (HYTRIN) 5 MG capsule Take 5 mg by mouth at bedtime. 11/28/20   [provider]  traMADol (ULTRAM) 50 MG tablet Take 1 tablet (50 mg total) by mouth every 6 (six) hours as needed for moderate pain or severe pain. 09/18/21   Lorella Nimrod, MD    Physical Exam: Vitals:   11/24/21 0932 11/24/21 0935  BP:  (!) 95/56  Pulse:  79  Resp:  18  Temp:  98.6 F (37 C)  TempSrc:  Oral  SpO2:  99%  Weight: 86.2 kg   Height: '5\' 6"'$  (1.676 m)     Constitutional: Elderly male currently in no acute distress while lying still Eyes:  Lids and conjunctivae normal ENMT: Mucous membranes are moist. Posterior pharynx clear of any exudate or lesions.  Neck: normal, supple, no masses, no thyromegaly Respiratory: clear to auscultation bilaterally, no wheezing, no crackles. Normal respiratory effort. No accessory muscle use.  Cardiovascular: Regular rate and rhythm, no murmurs / rubs / gallops. No extremity edema. 2+ pedal pulses. No carotid bruits.   Abdomen: no tenderness, no masses palpated. No hepatosplenomegaly. Bowel sounds positive.  Musculoskeletal: no clubbing / cyanosis.  Straight leg testing of the right leg appears to be positive. Skin: no rashes, lesions, ulcers. No induration Neurologic: Right hemianopsia.  Weakness noted in the right leg. Psychiatric: Alert and oriented to person and place, but thought year was 1949 and stated that he was here due to his back pain  Data Reviewed: {Tip this will not be part of the note when signed- Document your independent interpretation of telemetry tracing, EKG, lab, Radiology test or any other diagnostic tests. Add any new diagnostic test ordered today. (Optional):26781} EKG reveals sinus rhythm  at 76 bpm with premature atrial complexes.  Assessment and Plan: CVA history of CVA Acute.  Patient presented after having syncopal event with  confusion.  Found to have new interval infarct of the left occipital lobe (left PCA territory) with associated cortical laminar necrosis and/or petechial hemorrhage on MRI.  He is not a candidate for tPA and there was no signs of large vessel occlusion on MRA of the head.  Thought to possibly be thrombotic or embolic in nature.  Patient had just recently had stroke secondary to septic emboli in June of this year with MSSA bacteremia and mitral valve vegetation.  Wife reported issues with this patient related to the stroke. -Admit to a telemetry bed -Check blood cultures  -Neurochecks -Check echocardiogram -PT/OT/speech to evaluate and treat -Continue aspirin -TOC consult -Neurology consulted, we will follow-up for any further recommendations  Syncope Patient was noted to have lost consciousness this morning while eating breakfast and reportedly had some difficulty breathing temporarily.  Initial blood pressures were noted to be as low as 95/56.  Wife notes that he had not been very ambulatory due to severe back pain.  Question possibility of pulmonary  embolus. -O2 saturations currently maintained on room air -Check D-dimer -Follow-up echocardiogram -May warrant VQ scan as appears had issues after contrast was previously given in May  Acute metabolic encephalopathy Patient noted to be acutely confused and thought his wife was his sister and then his mother.  Thought secondary to stroke as noted above. -Continue to monitor  Acute kidney injury superimposed on chronic kidney disease stage IIIa Patient's creatinine noted to be 1.65 with BUN 20.  Creatinine previously had been 1.25 just 2 days ago.  Urinalysis did not show any signs of infection. -Continue normal saline IV fluids at 100 mL/h -Recheck  History of MSSA bacteremia with mitral valve vegetation Patient had PICC line placed and received IV antibiotics Naficillin which patient completed July 26 per wife report..  Normocytic anemia Chronic.  Hemoglobin 10.5 g/dL which appears around patient's baseline. -Continue to monitor  Abnormal chest x-ray Acute.  Chest x-ray noted 1.4 cm nodular opacity projecting over the anterior distal thoracic spine which could represent a pulmonary nodule versus overlapping osseous structures for which CT scan is recommended for further evaluation. -Consider need of a CT  Lumbar radiculopathy Patient reports having severe lower back pain with radiation down the right leg.  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 -Continue gabapentin -Oxycodone as needed for pain -Lumbar sacral support -PT/OT to evaluate and treat   DVT prophylaxis: Lovenox Advance Care Planning:   Code Status: Full Code   Consults: Neurology  Family Communication: Wife updated over the phone  Severity of Illness: The appropriate patient status for this patient is INPATIENT. Inpatient status is judged to be reasonable and necessary in order to provide  the required intensity of service to ensure the patient's safety. The patient's presenting symptoms, physical exam findings, and initial radiographic and laboratory data in the context of their chronic comorbidities is felt to place them at high risk for further clinical deterioration. Furthermore, it is not anticipated that the patient will be medically stable for discharge from the hospital within 2 midnights of admission.   * I certify that at the point of admission it is my clinical judgment that the patient will require inpatient hospital care spanning beyond 2 midnights from the point  of admission due to high intensity of service, high risk for further deterioration and high frequency of surveillance required.*  Author: Norval Morton, MD 11/24/2021 3:06 PM  For on call review [email or web address].

## 2021-11-24 NOTE — Consult Note (Signed)
Neurology Consultation Reason for Consult: Stroke Referring Physician: Charna Archer, C  CC: Syncope  History is obtained from: Patient  HPI: Mark [name] is a 81 y.o. male with a history of hypertension, hyperlipidemia, CKD who presents with syncope.  He apparently has been confused this morning, but then during breakfast he actually passed out.  No evidence of seizure activity.  He does not remember the episode.  Due to this he sought care in the emergency department, where a head CT was performed which demonstrates hypodensity in the left occipital region consistent with a subacute stroke   LKW: 8/7 prior to bed tpa given?: no, outside of window NIHSS: Three (hemianopia plus right leg drift)    Past Medical History:  Diagnosis Date   Arthritis    Lower Back, Hips, and Hands.   Cancer [facility])    Prostate CA s/o Rad Tx   Chronic kidney disease    Coronary artery disease    Hypercholesterolemia    Hypertension    Radiation 09/30/2008-12/02/2010   7800 cGy  Dr.Ottelin   Stroke [facility])      Family History  Problem Relation Age of Onset   Hypertension Father    Healthy Sister      Social History:  reports that he has never smoked. He has never used smokeless tobacco. He reports that he does not drink alcohol and does not use drugs.   Exam: Current vital signs: BP (!) 95/56 (BP Location: Left Arm)   Pulse 79   Temp 98.6 F (37 C) (Oral)   Resp 18   Ht '5\' 6"'$  (1.676 m)   Wt 86.2 kg   SpO2 99%   BMI 30.67 kg/m  Vital signs in last 24 hours: Temp:  [98.6 F (37 C)] 98.6 F (37 C) (08/08 0935) Pulse Rate:  [79] 79 (08/08 0935) Resp:  [18] 18 (08/08 0935) BP: (95)/(56) 95/56 (08/08 0935) SpO2:  [99 %] 99 % (08/08 0935) Weight:  [86.2 kg] 86.2 kg (08/08 0932)   Physical Exam  Constitutional: Appears well-developed and well-nourished.  Psych: Affect appropriate to situation Eyes: No scleral injection HENT: No OP obstruction MSK: no joint deformities.   Cardiovascular: Normal rate and regular rhythm.  Respiratory: Effort normal, non-labored breathing GI: Soft.  No distension. There is no tenderness.  Skin: WDI  Neuro: Mental Status: Patient is awake, alert, oriented to person, place, month, year, and situation. Patient is able to give a clear and coherent history. No signs of aphasia or neglect Cranial Nerves: II: Dense right hemianopia. Pupils are equal, round, and reactive to light.   III,IV, VI: EOMI without ptosis or diploplia.  V: Facial sensation is symmetric to temperature VII: Facial movement is symmetric.  VIII: hearing is intact to voice X: Uvula elevates symmetrically XI: Shoulder shrug is symmetric. XII: tongue is midline without atrophy or fasciculations.  Motor: Tone is normal. Bulk is normal. 5/5 strength was present in bilateral arms, some right leg weakness("since my last stroke") Sensory: Sensation is symmetric to light touch and temperature in the arms and legs. Cerebellar: FNF intact on the right, some tremor on the left      I have reviewed labs in epic and the results pertinent to this consultation are: Creatinine 1.65  I have reviewed the images obtained: CT-subacute left PCA territory stroke  Impression: 81 year old male with subacute left PCA stroke.  This could be thrombotic or embolic, he will need further work-up and therapy evaluation.  Recommendations: - HgbA1c, fasting lipid panel -  MRI of the brain without contrast - Frequent neuro checks - Echocardiogram - MRA head and neck - Prophylactic therapy-Antiplatelet med: Aspirin - dose '81mg'$   - Risk factor modification - Telemetry monitoring - PT consult, OT consult, Speech consult   Roland Rack, MD [facility] [phone]  If 7pm- 7am, please page neurology on call as listed in [place].

## 2021-11-25 ENCOUNTER — Encounter: Payer: Self-pay | Admitting: Internal Medicine

## 2021-11-25 ENCOUNTER — Inpatient Hospital Stay: Payer: No Typology Code available for payment source

## 2021-11-25 ENCOUNTER — Encounter
Admission: EM | Disposition: A | Discharge status: Home Health | Payer: Self-pay | Source: Home / Self Care | Attending: Internal Medicine

## 2021-11-25 DIAGNOSIS — I82433 Acute embolism and thrombosis of popliteal vein, bilateral: Secondary | ICD-10-CM

## 2021-11-25 DIAGNOSIS — I82409 Acute embolism and thrombosis of unspecified deep veins of unspecified lower extremity: Secondary | ICD-10-CM

## 2021-11-25 DIAGNOSIS — I629 Nontraumatic intracranial hemorrhage, unspecified: Secondary | ICD-10-CM

## 2021-11-25 DIAGNOSIS — I8290 Acute embolism and thrombosis of unspecified vein: Secondary | ICD-10-CM

## 2021-11-25 HISTORY — PX: IVC FILTER INSERTION: CATH118245

## 2021-11-25 LAB — ECHOCARDIOGRAM COMPLETE
Area-P 1/2: 3.08 cm2
Height: 66 in
S' Lateral: 2.4 cm
Weight: 3040 oz

## 2021-11-25 LAB — CBC
HCT: 26.7 % — ABNORMAL LOW (ref 39.0–52.0)
Hemoglobin: 8.2 g/dL — ABNORMAL LOW (ref 13.0–17.0)
MCH: 28.5 pg (ref 26.0–34.0)
MCHC: 30.7 g/dL (ref 30.0–36.0)
MCV: 92.7 fL (ref 80.0–100.0)
Platelets: 247 10*3/uL (ref 150–400)
RBC: 2.88 MIL/uL — ABNORMAL LOW (ref 4.22–5.81)
RDW: 13.4 % (ref 11.5–15.5)
WBC: 7.6 10*3/uL (ref 4.0–10.5)
nRBC: 0 % (ref 0.0–0.2)

## 2021-11-25 LAB — BASIC METABOLIC PANEL
Anion gap: 4 — ABNORMAL LOW (ref 5–15)
BUN: 16 mg/dL (ref 8–23)
CO2: 27 mmol/L (ref 22–32)
Calcium: 8.6 mg/dL — ABNORMAL LOW (ref 8.9–10.3)
Chloride: 114 mmol/L — ABNORMAL HIGH (ref 98–111)
Creatinine, Ser: 1.35 mg/dL — ABNORMAL HIGH (ref 0.61–1.24)
GFR, Estimated: 53 mL/min — ABNORMAL LOW (ref >60)
Glucose, Bld: 90 mg/dL (ref 70–99)
Potassium: 4.2 mmol/L (ref 3.5–5.1)
Sodium: 145 mmol/L (ref 135–145)

## 2021-11-25 SURGERY — IVC FILTER INSERTION
Anesthesia: Moderate Sedation

## 2021-11-25 MED ORDER — CEFAZOLIN SODIUM-DEXTROSE 2-4 GM/100ML-% IV SOLN
2.0000 g | INTRAVENOUS | Status: AC
  Administered 2021-11-25: 2 g via INTRAVENOUS

## 2021-11-25 MED ORDER — MIDAZOLAM HCL 2 MG/2ML IJ SOLN
INTRAMUSCULAR | Status: AC
  Filled 2021-11-25: qty 2

## 2021-11-25 MED ORDER — DIPHENHYDRAMINE HCL 50 MG/ML IJ SOLN: 50.0000 mg | Freq: Once | INTRAMUSCULAR | Status: DC | PRN

## 2021-11-25 MED ORDER — IODIXANOL 320 MG/ML IV SOLN
INTRAVENOUS | Status: DC | PRN
  Administered 2021-11-25: 15 mL

## 2021-11-25 MED ORDER — ROSUVASTATIN CALCIUM 20 MG PO TABS
40.0000 mg | ORAL_TABLET | Freq: Every day | ORAL | Status: DC
  Administered 2021-11-26 – 2021-12-02 (×7): 40 mg via ORAL
  Filled 2021-11-25 (×7): qty 2

## 2021-11-25 MED ORDER — HYDROMORPHONE HCL 1 MG/ML IJ SOLN: 1.0000 mg | Freq: Once | INTRAMUSCULAR | Status: DC | PRN

## 2021-11-25 MED ORDER — FAMOTIDINE 20 MG PO TABS: 40.0000 mg | ORAL_TABLET | Freq: Once | ORAL | Status: DC | PRN

## 2021-11-25 MED ORDER — FENTANYL CITRATE (PF) 100 MCG/2ML IJ SOLN
INTRAMUSCULAR | Status: AC
  Filled 2021-11-25: qty 2

## 2021-11-25 MED ORDER — FENTANYL CITRATE (PF) 100 MCG/2ML IJ SOLN
INTRAMUSCULAR | Status: DC | PRN
  Administered 2021-11-25: 50 ug via INTRAVENOUS

## 2021-11-25 MED ORDER — TECHNETIUM TO 99M ALBUMIN AGGREGATED
4.4200 | Freq: Once | INTRAVENOUS | Status: AC | PRN
  Administered 2021-11-25: 4.42 via INTRAVENOUS

## 2021-11-25 MED ORDER — MIDAZOLAM HCL 2 MG/ML PO SYRP: 8.0000 mg | ORAL_SOLUTION | Freq: Once | ORAL | Status: DC | PRN

## 2021-11-25 MED ORDER — METHYLPREDNISOLONE SODIUM SUCC 125 MG IJ SOLR: 125.0000 mg | Freq: Once | INTRAMUSCULAR | Status: DC | PRN

## 2021-11-25 MED ORDER — MIDAZOLAM HCL 2 MG/2ML IJ SOLN
INTRAMUSCULAR | Status: DC | PRN
  Administered 2021-11-25: 1 mg via INTRAVENOUS

## 2021-11-25 MED ORDER — ENOXAPARIN SODIUM 60 MG/0.6ML IJ SOSY
0.5000 mg/kg | PREFILLED_SYRINGE | INTRAMUSCULAR | Status: DC
  Administered 2021-11-26 – 2021-12-01 (×6): 42.5 mg via SUBCUTANEOUS
  Filled 2021-11-25 (×5): qty 0.6
  Filled 2021-11-25: qty 0.42
  Filled 2021-11-25: qty 0.6

## 2021-11-25 MED ORDER — SODIUM CHLORIDE 0.9 % IV SOLN: INTRAVENOUS | Status: DC

## 2021-11-25 MED ORDER — CHLORHEXIDINE GLUCONATE CLOTH 2 % EX PADS
6.0000 | MEDICATED_PAD | Freq: Every day | CUTANEOUS | Status: DC
  Administered 2021-11-25: 6 via TOPICAL

## 2021-11-25 MED ORDER — ONDANSETRON HCL 4 MG/2ML IJ SOLN: 4.0000 mg | Freq: Four times a day (QID) | INTRAMUSCULAR | Status: DC | PRN

## 2021-11-25 SURGICAL SUPPLY — 6 items
COVER PROBE U/S 5X48 (MISCELLANEOUS) ×1 IMPLANT
GLIDEWIRE ADV .035X180CM (WIRE) ×1 IMPLANT
GUIDEWIRE SUPER STIFF .035X180 (WIRE) ×1 IMPLANT
KIT FEM OPTION ELITE FILTER (Filter) ×1 IMPLANT
PACK ANGIOGRAPHY (CUSTOM PROCEDURE TRAY) ×2 IMPLANT
SHEATH BRITE TIP 5FRX11 (SHEATH) ×1 IMPLANT

## 2021-11-25 NOTE — Progress Notes (Signed)
       CROSS COVER NOTE  NAME: Mark [name] MRN: [id number] DOB : Apr 30, 1940   Message received from nursing requesting I review ultrasound ordered on admission. Impression shows near occlusive DVT within the left profundofemoral vein to the hip, femoral venous confluence without occlusion.  Additionally shows occlusive DVT within the visualized posterior tibial and peroneal veins of the lower extremities bilaterally.  [name]  is a 81 year old male with PMH hypertension, hyperlipidemia, CVA, chronic back pain 2/2 degenerative disc disease, and  prostate cancer who presented to [facility] ED and a code stroke was called.  MRI shows left occipital lobe infarct and petechial hemorrhage.  Will defer decision for anticoagulation or IVC filter to day team after consultation with neurology.  This document was prepared using Dragon voice recognition software and may include unintentional dictation errors.  Neomia Glass DNP, MHA, FNP-BC Nurse Practitioner [facility] Pager [phone]

## 2021-11-25 NOTE — Evaluation (Signed)
Speech Language Pathology Evaluation Patient Details Name: Mark [name] MRN: [id number] DOB: 04-22-1940 Today's Date: 11/25/2021 Time: 1607-3710 SLP Time Calculation (min) (ACUTE ONLY): 50 min  Problem List:  Patient Active Problem List   Diagnosis Date Noted   CVA (cerebral vascular accident) ([place]) 11/24/2021   Syncope 62/69/4854   Acute metabolic encephalopathy 62/70/3500   Normocytic anemia 11/24/2021   History of bacteremia 11/24/2021   Abnormal chest x-ray 11/24/2021   Lumbar radiculopathy 11/24/2021   Hypokalemia 09/16/2021   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   Leukocytosis 09/15/2021   Acute kidney injury superimposed on chronic kidney disease ([place]) 09/15/2021   Fever 09/15/2021   Hypertension 02/02/2016   Hypercholesterolemia 02/02/2016   Left shoulder pain 02/02/2016   Encounter for pre-employment health screening examination 02/02/2016   Malignant neoplasm of prostate ([place]) 07/05/2011   Past Medical History:  Past Medical History:  Diagnosis Date   Arthritis    Lower Back, Hips, and Hands.   Cancer [facility])    Prostate CA s/o Rad Tx   Chronic kidney disease    Coronary artery disease    Hypercholesterolemia    Hypertension    Radiation 09/30/2008-12/02/2010   7800 cGy  Dr.Ottelin   Stroke [facility])    Past Surgical History:  Past Surgical History:  Procedure Laterality Date   CATARACT SURGERY     KNEE ARTHROSCOPY Left    QUADRICEPS TENDON REPAIR Right 12/15/2020   Procedure: REPAIR QUADRICEP TENDON;  Surgeon: Lovell Sheehan, MD;  Location: [facility] ORS;  Service: Orthopedics;  Laterality: Right;   HPI:  Pt  is a 81 y.o. 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  History is obtained mostly from his  wife over the phone as the patient reports that he is here due to right-sided back pain and the year is 67.  This morning around 2 AM the patient had said he needed to go to the restroom, but was unable to get up.  He had been sleeping in his a lift chair as his back have been giving him trouble for quite some time.  His wife had given him a urinal to use and he had gone back to sleep.  He woke up 2 hours later and was stating that he had the get up.  She attempted to help him up, but was unable.  A family friend was able to get him up to the wheelchair later on that morning.  His wife states that the was eating breakfast and the next thing she noticed was his head was laid back and his eyes were rolled in the back of his head. She went to check on him and he did not respond at first.  He did not respond for 30 seconds or so, and then was just staring at her and seem like he was having trouble breathing.  She called EMS at that time.    He had last been hospitalized from 6/5-6/13 at [facility] where he was noted to have multifocal embolic Infarcts related to MSSA bacteremia with a mitral valve vegetation.  A PICC line was placed and he was treated with nafcillin.  His wife notes that he completed the antibiotics on 7/26.  Following the Stroke then, patient had issues with his vision, but his wife reported  no other deficits.     MRI: In comparison to MRI from Sep 14, 2021, new/interval infarct in  the left occipital lobe (left PCA territory), which is likely  subacute in chronicity and described above. Associated cortical  laminar necrosis and/or petechial hemorrhage.  2. Moderate chronic microvascular ischemic disease.      Assessment / Plan / Recommendation Clinical Impression   Met w/ pt in room for informal cognitive-linguistic evaluation today. Pt resting in bed and fully awakened easily w/ verbal engagement. He did not seem aware that the breakfast tray was sitting on the table beside him. He stated  desire to eat the meal, so setup was provided d/t vision deficits.  Pt stated he lived at home w/ his Wife; unsure of pt's baseline at home in setting of prior CVA, per chart/Wife notes. Pt was UNABLE to immediately name his children and where they lived in relationship to him but was able to give some details as to where he lived, and his Wife. Prompts were given to elicit information; paucity of details.   Pt exhibits Cognitive-linguistic deficits w/ some receptive language deficits AND MODERATE+ Vision Deficits in setting of acute Left PCA territory. Left Occipital lobe infarct. Responses to Cognitive/Orientation questions revealed orientation to self and place; he was not clear as to why he was brought to the hospital. Again, paucity of details/speech.    In the setting of the MODERATE+ vision deficits and decreased proprioceptive awareness/attention, pt exhibited difficulty following more that 1-step commands; he pushed away vs pulled forward upon command when attempting to sit him up. Decreased body awareness noted. Pt was able to be guided/prompted w/ verbal/tactile cues and followed through adequately w/ prompts to initiate tasks(self-feeding using cup and utensil). Pt exhibited intermittent Receptive language tasks or more complex questions/tasks, including answering Y/N questions. Object function/demonstration was functional for 4/5 tasks. Pt identified 4/5 objects when holding them. He completed most Expressive language tasks w/ adequate accuracy including naming, repetition; no gross verbal errors noted. Pt completed automatic speech tasks including greetings, social responses w/out difficulty --  clear speech/responses. Most verbal output was in direct relation to direct questions. Pt was easily distracted and required verbal/tactile cues and prompts for follow through w/ tasks at breakfast meal.     SLP provided education re: importance of further skilled ST services to address  Cognitive-linguistic deficits/needs and Receptive language needs along w/ language strategies to increase overall communication and communication effectiveness in his ADLs to improve his functional quality of life. Recommended f/u while admitted and at Discharge -- f/u w/ Family re: Rehab post D/C. The CM and MD/NSG were updated as to this. Unsure if pt would be a candidate for CIR.  Based on today's assessment, anticipate need for frequent/constant, 100% Supervision/assistance at d/c d/t safety concerns. SLP to continue to follow acutely while admitted w/ ongoing assessment.  NSG updated on pt's need for Supervision at meals d/t Vision deficits.     SLP Assessment  SLP Recommendation/Assessment: Patient needs continued Speech [place] Pathology Services (and at D/C) SLP Visit Diagnosis: Cognitive communication deficit (R41.841) (vision deficits and reduced awareness)    Recommendations for follow up therapy are one component of a multi-disciplinary discharge planning process, led by the attending physician.  Recommendations may be updated based on patient status, additional functional criteria and insurance authorization.    Follow Up Recommendations  Skilled nursing-short term rehab (<3 hours/day) (vs Outpt vs Home Health vs CIR)    Assistance Recommended at Discharge  Frequent  or constant Supervision/Assistance (vision deficits and awareness)  Functional Status Assessment Patient has had a recent decline in their functional status and/or demonstrates limited ability to make significant improvements in function in a reasonable and predictable amount of time  Frequency and Duration min 2x/week  2 weeks      SLP Evaluation Cognition  Overall Cognitive Status: No family/caregiver present to determine baseline cognitive functioning (Difficult to assess -- noted MRI indicating "Moderate chronic microvascular ischemic disease" at baseline. Significant vision and proprioceptive deficits  noted.) Arousal/Alertness: Awake/alert Orientation Level: Oriented to person;Oriented to place (was not clear as to why he was brought to the hospital) Attention: Focused;Sustained Focused Attention: Impaired Focused Attention Impairment: Verbal basic;Functional basic Sustained Attention: Impaired Sustained Attention Impairment: Verbal basic;Functional basic Memory: Impaired Memory Impairment: Decreased short term memory (could not state who had been in his room this morning (Nurse, MD, dining services)) Awareness: Impaired Awareness Impairment: Anticipatory impairment Problem Solving: Impaired Problem Solving Impairment: Verbal basic;Functional basic (needed to open the juice to drink it; offered a straw and he did not attempt to put it in the cup. Vision deficits.) Executive Function: Decision Making Decision Making: Impaired Decision Making Impairment: Verbal basic;Functional basic Safety/Judgment: Impaired Comments: gave little details to problem situation - paucity of response/speech       Comprehension  Auditory Comprehension Overall Auditory Comprehension: Impaired Yes/No Questions: Impaired Complex Questions: 50-74% accurate Commands: Within Functional Limits (for 1-2 step commands) Conversation: Simple Interfering Components: Attention;Visual impairments EffectiveTechniques: Repetition;Stressing words;Visual/Gestural cues Visual Recognition/Discrimination Discrimination: Not tested Reading Comprehension Reading Status: Not tested (d/t vision deficits)    Expression Expression Primary Mode of Expression: Verbal Verbal Expression Overall Verbal Expression: Appears within functional limits for tasks assessed (grossly) Initiation: No impairment Automatic Speech: Name;Social Response;Counting;Day of week Level of Generative/Spontaneous Verbalization: Sentence Repetition: No impairment Naming: No impairment (basic objects of ADLs) Pragmatics: No impairment Non-Verbal  Means of Communication: Not applicable Other Verbal Expression Comments: distracted Written Expression Dominant Hand: Right Written Expression: Not tested   Oral / Motor  Oral Motor/Sensory Function Overall Oral Motor/Sensory Function: Within functional limits Motor Speech Overall Motor Speech: Appears within functional limits for tasks assessed Respiration: Within functional limits Phonation: Normal Resonance: Within functional limits Articulation: Within functional limitis Intelligibility: Intelligible Motor Planning: Witnin functional limits Motor Speech Errors: Not applicable               Orinda Kenner, MS, CCC-SLP Speech Language Pathologist Rehab Services; [place] [phone] (ascom) Arieonna Medine 11/25/2021, 1:03 PM

## 2021-11-25 NOTE — TOC Initial Note (Signed)
Transition of Care [facility]) - Initial/Assessment Note    Patient Details  Name: Mark [name] MRN: [id number] Date of Birth: 21-May-1940  Transition of Care [facility]) CM/SW Contact:    Laurena Slimmer, RN Phone Number: 11/25/2021, 12:04 PM  Clinical Narrative:                 Attempt to reach patient's wife regarding discharge plan. No answer. Left a message.         Patient Goals and CMS Choice        Expected Discharge Plan and Services                                                Prior Living Arrangements/Services                       Activities of Daily Living   ADL Screening (condition at time of admission) Patient's cognitive ability adequate to safely complete daily activities?: Yes Is the patient deaf or have difficulty hearing?: No Does the patient have difficulty seeing, even when wearing glasses/contacts?: Yes Does the patient have difficulty concentrating, remembering, or making decisions?: No Patient able to express need for assistance with ADLs?: Yes Independently performs ADLs?: No Communication: Independent Dressing (OT): Needs assistance Is this a change from baseline?: Change from baseline, expected to last <3days Grooming: Needs assistance Is this a change from baseline?: Change from baseline, expected to last <3 days Feeding: Independent Bathing: Needs assistance Is this a change from baseline?: Change from baseline, expected to last <3 days Toileting: Needs assistance Is this a change from baseline?: Change from baseline, expected to last <3 days In/Out Bed: Needs assistance Is this a change from baseline?: Change from baseline, expected to last <3 days Walks in Home: Needs assistance Is this a change from baseline?: Change from baseline, expected to last <3 days Does the patient have difficulty walking or climbing stairs?: Yes Weakness of Legs: Both Weakness of Arms/Hands: None  Permission Sought/Granted                   Emotional Assessment              Admission diagnosis:  Syncope and collapse [R55] CVA (cerebral vascular accident) ([place]) [I63.9] AKI (acute kidney injury) ([place]) [N17.9] Cerebrovascular accident (CVA), unspecified mechanism ([place]) [I63.9] Patient Active Problem List   Diagnosis Date Noted   CVA (cerebral vascular accident) ([place]) 11/24/2021   Syncope 49/70/2637   Acute metabolic encephalopathy 85/88/5027   Normocytic anemia 11/24/2021   History of bacteremia 11/24/2021   Abnormal chest x-ray 11/24/2021   Lumbar radiculopathy 11/24/2021   Hypokalemia 09/16/2021   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   Leukocytosis 09/15/2021   Acute kidney injury superimposed on chronic kidney disease ([place]) 09/15/2021   Fever 09/15/2021   Hypertension 02/02/2016   Hypercholesterolemia 02/02/2016   Left shoulder pain 02/02/2016   Encounter for pre-employment health screening examination 02/02/2016   Malignant neoplasm of prostate ([place]) 07/05/2011   PCP:  Donald Prose, MD Pharmacy:   [id number] [id number]: [phone]: [phone]    Social Determinants of Health (SDOH) Interventions    Readmission Risk Interventions     No data to display

## 2021-11-25 NOTE — Progress Notes (Signed)
Subjective: Patient was found to have DVT. He is confused.   Exam: Vitals:   11/25/21 0832 11/25/21 1151  BP: 138/75 (!) 147/69  Pulse: 62 64  Resp: 18 18  Temp: (!) 97.3 F (36.3 C) 98.4 F (36.9 C)  SpO2: 99% 98%   Gen: In bed, NAD Resp: non-labored breathing, no acute distress Abd: soft, nt  Neuro: MS: Awake, alert, interactive and appropriate. He has a tangential thought process, appears slightly confused. Speech is fluent.  CN: Pupils equal round and reactive, extraocular movements intact. He has a dense right hemianopia.  Motor: 5/5 throughout Sensory:intact to LT  Pertinent Labs: LDL 99 A1c of 3.8  Impression: 81 year old male with a history of hypertension, hyperlipidemia, previous stroke who presents with subacute left PCA stroke.  He does have significant petechial hemorrhage, which is visible on CT.  I would favor holding off on anticoagulation at the time.  Plan to repeat CT tomorrow, but certainly would not start anticoagulation for least a week.  If he is felt to be a very high risk prior to this, could consider filter.  Recommendations: - Prophylactic therapy-Antiplatelet med: Aspirin - dose '81mg'$   - Increase Crestor to 40 mg nightly -Hold anticoagulation for now, will repeat CT tomorrow morning - pt,ot,st - neurology to follow.   Roland Rack, MD [facility] [phone]  If 7pm- 7am, please page neurology on call as listed in [place]

## 2021-11-25 NOTE — Progress Notes (Signed)
Progress Note   Patient: Mark MCKIM XTG:[id number] DOB: Sep 23, 1940 DOA: 11/24/2021     1 DOS: the patient was seen and examined on 11/25/2021     Assessment and Plan: * CVA (cerebral vascular accident) [facility]) MRI showing a new interval infarct in left occipital lobe which is likely subacute in chronicity and associated with cortical laminar necrosis and/or petechial hemorrhage.  Neurology cleared to be on aspirin.  LDL 99.  Patient on Crestor.  Case discussed with neurology and they would recommend not giving full anticoagulation for at least a week.  DVT (deep venous thrombosis) ([facility]) Acute appearing, near occlusive DVT with the left profundus femoral vein protruding into the common femoral veins.  Occlusive DVT visualized in posterior tibial and peroneal veins of the lower extremities bilaterally.  Case discussed with neurology and recommends not giving full anticoagulation for at least a week.  Perfusion scan negative for PE.  Case discussed with Dr. Lucky Cowboy vascular surgery and he will evaluate for filter placement today.  Syncope Could be secondary to stroke and/or hypotension.  Blood pressure medications held.  Acute metabolic encephalopathy Likely secondary to stroke.  Continue to monitor.  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  Creatinine 1.65 on presentation down to 1.35 with a GFR 53 currently.  History of bacteremia Blood cultures negative.  Normocytic anemia Last hemoglobin 8.2.  Continue to monitor closely.  Decrease rate of IV fluids  Abnormal chest x-ray 1.4 cm nodular opacity projecting over the anterior distal thoracic spine which could represent a pulmonary nodule versus overlapping osseous structures.  Of note, the patient did have a CT scan of the chest on 09/14/2021 a few months ago that was negative.  Lumbar radiculopathy Patient on gabapentin and oxycodone as needed.         Subjective: Patient is not the best historian.  He complains of having some visual disturbances bilateral eyes.  Complains of weakness bilaterally.  Complained to the nurse about leg pain.  Physical Exam: Vitals:   11/25/21 0153 11/25/21 0514 11/25/21 0832 11/25/21 1151  BP: 132/87 127/66 138/75 (!) 147/69  Pulse: 65 62 62 64  Resp: '18 16 18 18  '$ Temp: 97.9 F (36.6 C) 97.6 F (36.4 C) (!) 97.3 F (36.3 C) 98.4 F (36.9 C)  TempSrc: Oral Oral Oral   SpO2: 100% 97% 99% 98%  Weight:      Height:       Physical Exam HENT:     Head: Normocephalic.     Mouth/Throat:     Pharynx: No oropharyngeal exudate.  Eyes:     General: Lids are normal.     Conjunctiva/sclera: Conjunctivae normal.  Cardiovascular:     Rate and Rhythm: Normal rate and regular rhythm.     Heart sounds: Normal heart sounds, S1 normal and S2 normal.  Pulmonary:     Breath sounds: No decreased breath sounds, wheezing, rhonchi or rales.  Abdominal:     Palpations: Abdomen is soft.     Tenderness: There is no abdominal tenderness.  Musculoskeletal:     Right lower leg: Swelling present.     Left lower leg: Swelling present.  Neurological:     Mental Status: He is alert.     Comments: Hardly able to lift legs up off the bed.  Able to lift arms up.     Data Reviewed: MRI of the brain shows a new interval infarct in the left  occipital area which is likely subacute in chronicity with laminar necrosis and/or petechial hemorrhage.  MRA shows negative MRA of the neck.  Echocardiogram pending  LDL 99, creatinine 1.35, hemoglobin 8.2, platelet count 247, white blood cell count 7.6.  Family Communication: Tried to reach the wife twice today and left messages.  Disposition: Status is: Inpatient Remains inpatient appropriate because: Being treated for stroke.  Diagnosed with DVT.  Patient will get an IVC filter today.  Planned Discharge Destination: Home    Time spent: 29 minutes  Author: Loletha Grayer,  MD 11/25/2021 1:56 PM  For on call review [email or web address].

## 2021-11-25 NOTE — Assessment & Plan Note (Addendum)
Could be secondary to stroke and/or hypotension.   Blood pressure stable.

## 2021-11-25 NOTE — Plan of Care (Signed)

## 2021-11-25 NOTE — Assessment & Plan Note (Addendum)
MRI showing a new interval infarct in left occipital lobe which is likely subacute in chronicity and associated with cortical laminar necrosis and/or petechial hemorrhage.  Neurology cleared to be on aspirin.  LDL 99.  Patient on Crestor.  Case discussed with neurology and they would recommend not giving full anticoagulation for at least a week.

## 2021-11-25 NOTE — Assessment & Plan Note (Addendum)
Acute appearing, near occlusive DVT with the left profundus femoral vein protruding into the common femoral veins.  Occlusive DVT visualized in posterior tibial and peroneal veins of the lower extremities bilaterally.  Perfusion scan negative for PE.  IVC filter placed by Dr. Lucky Cowboy on 11/25/2021. --neurology and they would recommend not giving full anticoagulation for 2 weeks. --I prescribed Eliquis to be started on 12/08/2021.  Benefits and risks of Eliquis explained to patient's wife and patient.  Risk of bleeding explained.  If he is able to tolerate the Eliquis then the filter can be removed as outpatient.

## 2021-11-25 NOTE — Assessment & Plan Note (Addendum)
Last hemoglobin 9.2.  Continue to monitor closely.

## 2021-11-25 NOTE — Assessment & Plan Note (Signed)
Patient on gabapentin and oxycodone as needed.

## 2021-11-25 NOTE — Assessment & Plan Note (Signed)
1.4 cm nodular opacity projecting over the anterior distal thoracic spine which could represent a pulmonary nodule versus overlapping osseous structures.  Of note, the patient did have a CT scan of the chest on 09/14/2021 a few months ago that was negative.

## 2021-11-25 NOTE — Consult Note (Signed)
[facility] Consult Note  MRN : [id number]  Mark [name] is a 81 y.o. (Nov 04, 1940) male who presents with chief complaint of  Chief Complaint  Patient presents with   Loss of Consciousness  .   Consulting Physician:Richard Leslye Peer, MD Reason for consult: Deep vein thrombosis with intracranial hemorrhage History of Present Illness: Mark [name] is an 81 year old male with a previous medical history of hypertension, hyperlipidemia as well as a previous stroke presented to [facility] following waking up with confusion and disorientation.  In the midst of workup it was found that the patient had a subacute left PCA stroke with significant petechial hemorrhage.  It was also noted that the patient had bilateral DVT with the left being worse.  Currently neurology recommends that anticoagulation be held for at least a week. Current Facility-Administered Medications  Medication Dose Route Frequency Provider Last Rate Last Admin   0.9 %  sodium chloride infusion   Intravenous Continuous Loletha Grayer, MD 40 mL/hr at 11/25/21 1845 New Bag at 11/25/21 1845   acetaminophen (TYLENOL) tablet 650 mg  650 mg Oral Q4H PRN Norval Morton, MD       Or   acetaminophen (TYLENOL) 160 MG/5ML solution 650 mg  650 mg Per Tube Q4H PRN Norval Morton, MD       Or   acetaminophen (TYLENOL) suppository 650 mg  650 mg Rectal Q4H PRN Norval Morton, MD       aspirin EC tablet 81 mg  81 mg Oral Daily Tamala Julian, Rondell A, MD   81 mg at 11/25/21 0809   enoxaparin (LOVENOX) injection 42.5 mg  0.5 mg/kg Subcutaneous Q24H Wynelle Cleveland, RPH       fentaNYL (SUBLIMAZE) 100 MCG/2ML injection            gabapentin (NEURONTIN) capsule 100 mg  100 mg Oral QHS Smith, Rondell A, MD   100 mg at 11/25/21 2100   hydrALAZINE (APRESOLINE) injection 10 mg  10 mg Intravenous Q4H PRN Fuller Plan A, MD       HYDROmorphone (DILAUDID) injection 1 mg  1 mg Intravenous  Once PRN Kris Hartmann, NP       lidocaine (XYLOCAINE) 5 % ointment 1 Application  1 Application Topical PRN Fuller Plan A, MD       midazolam (VERSED) 2 MG/2ML injection            ondansetron (ZOFRAN) injection 4 mg  4 mg Intravenous Q6H PRN Kris Hartmann, NP       oxyCODONE-acetaminophen (PERCOCET/ROXICET) 5-325 MG per tablet 1 tablet  1 tablet Oral Q6H PRN Fuller Plan A, MD   1 tablet at 11/25/21 1900   [START ON 11/26/2021] rosuvastatin (CRESTOR) tablet 40 mg  40 mg Oral Daily Greta Doom, MD       terazosin (HYTRIN) capsule 5 mg  5 mg Oral QHS Fuller Plan A, MD   5 mg at 11/25/21 2100    Past Medical History:  Diagnosis Date   Arthritis    Lower Back, Hips, and Hands.   Cancer [facility])    Prostate CA s/o Rad Tx   Chronic kidney disease    Coronary artery disease    Hypercholesterolemia    Hypertension    Radiation 09/30/2008-12/02/2010   7800 cGy  Dr.Ottelin   Stroke [facility])     Past Surgical History:  Procedure Laterality Date   CATARACT SURGERY     KNEE ARTHROSCOPY Left  QUADRICEPS TENDON REPAIR Right 12/15/2020   Procedure: REPAIR QUADRICEP TENDON;  Surgeon: Lovell Sheehan, MD;  Location: [facility] ORS;  Service: Orthopedics;  Laterality: Right;    Social History Social History   Tobacco Use   Smoking status: Never   Smokeless tobacco: Never  Vaping Use   Vaping Use: Never used  Substance Use Topics   Alcohol use: No   Drug use: No    Family History Family History  Problem Relation Age of Onset   Hypertension Father    Healthy Sister     Allergies  Allergen Reactions   Atorvastatin Other (See Comments)     REVIEW OF SYSTEMS (Negative unless checked)  Constitutional: '[]'$ Weight loss  '[]'$ Fever  '[]'$ Chills Cardiac: '[]'$ Chest pain   '[]'$ Chest pressure   '[]'$ Palpitations   '[]'$ Shortness of breath when laying flat   '[]'$ Shortness of breath at rest   '[]'$ Shortness of breath with exertion. Vascular:  '[]'$ Pain in legs with walking   '[]'$ Pain in legs at rest    '[]'$ Pain in legs when laying flat   '[]'$ Claudication   '[]'$ Pain in feet when walking  '[]'$ Pain in feet at rest  '[]'$ Pain in feet when laying flat   '[]'$ History of DVT   '[]'$ Phlebitis   '[]'$ Swelling in legs   '[]'$ Varicose veins   '[]'$ Non-healing ulcers Pulmonary:   '[]'$ Uses home oxygen   '[]'$ Productive cough   '[]'$ Hemoptysis   '[]'$ Wheeze  '[]'$ COPD   '[]'$ Asthma Neurologic:  '[x]'$ Dizziness  '[]'$ Blackouts   '[]'$ Seizures   '[]'$ History of stroke   '[]'$ History of TIA  '[]'$ Aphasia   '[]'$ Temporary blindness   '[]'$ Dysphagia   '[]'$ Weakness or numbness in arms   '[]'$ Weakness or numbness in legs Musculoskeletal:  '[]'$ Arthritis   '[]'$ Joint swelling   '[]'$ Joint pain   '[]'$ Low back pain Hematologic:  '[]'$ Easy bruising  '[]'$ Easy bleeding   '[]'$ Hypercoagulable state   '[]'$ Anemic  '[]'$ Hepatitis Gastrointestinal:  '[]'$ Blood in stool   '[]'$ Vomiting blood  '[]'$ Gastroesophageal reflux/heartburn   '[]'$ Difficulty swallowing. Genitourinary:  '[]'$ Chronic kidney disease   '[]'$ Difficult urination  '[]'$ Frequent urination  '[]'$ Burning with urination   '[]'$ Blood in urine Skin:  '[]'$ Rashes   '[]'$ Ulcers   '[]'$ Wounds Psychological:  '[]'$ History of anxiety   '[]'$  History of major depression.  Physical Examination  Vitals:   11/25/21 1730 11/25/21 1800 11/25/21 1838 11/25/21 2047  BP: 139/73 (!) 157/80 (!) 148/75 136/75  Pulse: 60 64 (!) 59 (!) 56  Resp: '14 18 17 17  '$ Temp:   98.7 F (37.1 C) 98.8 F (37.1 C)  TempSrc:   Oral   SpO2: 100% 99% 99% 100%  Weight:      Height:       Body mass index is 30.67 kg/m. Gen:  WD/WN, NAD Head: [place]/AT, No temporalis wasting. Prominent temp pulse not noted. Ear/Nose/Throat: Hearing grossly intact, nares w/o erythema or drainage, oropharynx w/o Erythema/Exudate Eyes: Sclera non-icteric, conjunctiva clear Neck: Trachea midline.  No JVD.  Pulmonary:  Good air movement, respirations not labored, equal bilaterally.  Cardiac: RRR, normal S1, S2. Vascular: +1 edema bilaterally Vessel Right Left  Radial Palpable Palpable  PT Palpable Palpable  DP Palpable Palpable    Gastrointestinal: soft, non-tender/non-distended. No guarding/reflex.  Musculoskeletal: M/S 5/5 throughout.  Extremities without ischemic changes.  No deformity or atrophy.  Neurologic: Sensation grossly intact in extremities.  Symmetrical.  Speech is fluent. Motor exam as listed above. Psychiatric: Judgment intact, Mood & affect appropriate for pt's clinical situation. Dermatologic: No rashes or ulcers noted.  No cellulitis or open wounds. Lymph : No Cervical, Axillary, or  Inguinal lymphadenopathy.    CBC Lab Results  Component Value Date   WBC 7.6 11/25/2021   HGB 8.2 (L) 11/25/2021   HCT 26.7 (L) 11/25/2021   MCV 92.7 11/25/2021   PLT 247 11/25/2021    BMET    Component Value Date/Time   NA 145 11/25/2021 0426   K 4.2 11/25/2021 0426   CL 114 (H) 11/25/2021 0426   CO2 27 11/25/2021 0426   GLUCOSE 90 11/25/2021 0426   BUN 16 11/25/2021 0426   CREATININE 1.35 (H) 11/25/2021 0426   CREATININE 1.25 (H) 02/02/2016 1124   CALCIUM 8.6 (L) 11/25/2021 0426   GFRNONAA 53 (L) 11/25/2021 0426   GFRNONAA 56 (L) 02/02/2016 1124   GFRAA 65 02/02/2016 1124   Estimated Creatinine Clearance: 44.9 mL/min (A) (by C-G formula based on SCr of 1.35 mg/dL (H)).  COAG Lab Results  Component Value Date   INR 1.0 11/24/2021   INR 1.1 10/16/2021   INR 1.2 09/15/2021    Radiology ECHOCARDIOGRAM COMPLETE  Result Date: 11/25/2021    ECHOCARDIOGRAM REPORT   Patient Name:   KEDRON UNO Date of Exam: 11/24/2021 Medical Rec #:  [id number]         Height:       66.0 in Accession #:    [id number]        Weight:       190.0 lb Date of Birth:  1940/11/13         BSA:          1.957 m Patient Age:    40 years          BP:           167/82 mmHg Patient Gender: M                 HR:           68 bpm. Exam Location:  [facility] Procedure: 2D Echo, Cardiac Doppler and Color Doppler Indications:     I63.9 Stroke  History:         Patient has no prior history of Echocardiogram examinations.                   CAD, Stroke; Risk Factors:Hypertension.  Sonographer:     Cresenciano Lick RDCS Referring Phys:  [id number] RONDELL A SMITH Diagnosing Phys: Kathlyn Sacramento MD IMPRESSIONS  1. Left ventricular ejection fraction, by estimation, is 60 to 65%. The left ventricle has normal function. The left ventricle has no regional wall motion abnormalities. There is mild left ventricular hypertrophy. Left ventricular diastolic parameters were normal.  2. Right ventricular systolic function is normal. The right ventricular size is normal. Tricuspid regurgitation signal is inadequate for assessing PA pressure.  3. Left atrial size was mildly dilated.  4. The mitral valve is normal in structure. Mild to moderate mitral valve regurgitation. No evidence of mitral stenosis.  5. The aortic valve is normal in structure. Aortic valve regurgitation is not visualized. Aortic valve sclerosis/calcification is present, without any evidence of aortic stenosis. FINDINGS  Left Ventricle: Left ventricular ejection fraction, by estimation, is 60 to 65%. The left ventricle has normal function. The left ventricle has no regional wall motion abnormalities. The left ventricular internal cavity size was normal in size. There is  mild left ventricular hypertrophy. Left ventricular diastolic parameters were normal. Right Ventricle: The right ventricular size is normal. No increase in right ventricular wall thickness. Right ventricular systolic function is normal. Tricuspid regurgitation signal is  inadequate for assessing PA pressure. Left Atrium: Left atrial size was mildly dilated. Right Atrium: Right atrial size was normal in size. Pericardium: There is no evidence of pericardial effusion. Mitral Valve: The mitral valve is normal in structure. Mild mitral annular calcification. Mild to moderate mitral valve regurgitation. No evidence of mitral valve stenosis. Tricuspid Valve: The tricuspid valve is normal in structure. Tricuspid valve regurgitation is  not demonstrated. No evidence of tricuspid stenosis. Aortic Valve: The aortic valve is normal in structure. Aortic valve regurgitation is not visualized. Aortic valve sclerosis/calcification is present, without any evidence of aortic stenosis. Pulmonic Valve: The pulmonic valve was normal in structure. Pulmonic valve regurgitation is not visualized. No evidence of pulmonic stenosis. Aorta: The aortic root is normal in size and structure. Venous: The inferior vena cava was not well visualized. IAS/Shunts: No atrial level shunt detected by color flow Doppler.  LEFT VENTRICLE PLAX 2D LVIDd:         4.60 cm   Diastology LVIDs:         2.40 cm   LV e' medial:    7.72 cm/s LV PW:         1.00 cm   LV E/e' medial:  12.7 LV IVS:        0.80 cm   LV e' lateral:   8.49 cm/s LVOT diam:     2.20 cm   LV E/e' lateral: 11.5 LV SV:         84 LV SV Index:   43 LVOT Area:     3.80 cm  RIGHT VENTRICLE RV Basal diam:  3.60 cm RV S prime:     9.57 cm/s TAPSE (M-mode): 2.0 cm LEFT ATRIUM             Index        RIGHT ATRIUM           Index LA diam:        4.50 cm 2.30 cm/m   RA Area:     10.90 cm LA Vol (A2C):   93.2 ml 47.63 ml/m  RA Volume:   20.70 ml  10.58 ml/m LA Vol (A4C):   56.6 ml 28.93 ml/m LA Biplane Vol: 77.5 ml 39.61 ml/m  AORTIC VALVE LVOT Vmax:   88.70 cm/s LVOT Vmean:  61.400 cm/s LVOT VTI:    0.220 m  AORTA Ao Root diam: 3.40 cm Ao Asc diam:  3.30 cm MITRAL VALVE MV Area (PHT): 3.08 cm     SHUNTS MV Decel Time: 246 msec     Systemic VTI:  0.22 m MV E velocity: 97.90 cm/s   Systemic Diam: 2.20 cm MV A velocity: 109.00 cm/s MV E/A ratio:  0.90 Kathlyn Sacramento MD Electronically signed by Kathlyn Sacramento MD Signature Date/Time: 11/25/2021/5:49:55 PM    Final    PERIPHERAL VASCULAR CATHETERIZATION  Result Date: 11/25/2021 See surgical note for result.  NM Pulmonary Perfusion  Result Date: 11/25/2021 CLINICAL DATA:  81 year old male with suspected pulmonary embolism EXAM: NUCLEAR MEDICINE PERFUSION LUNG SCAN  TECHNIQUE: Perfusion images were obtained in multiple projections after intravenous injection of radiopharmaceutical. Ventilation scans intentionally deferred if perfusion scan and chest x-ray adequate for interpretation during COVID 19 epidemic. RADIOPHARMACEUTICALS:  4.42 mCi Tc-63mMAA IV COMPARISON:  Chest x-ray 11/24/2021 FINDINGS: Planar scintigraphic images of the chest were performed after standard administration of technetium MAA in standard projections. No large perfusion defects. IMPRESSION: No large perfusion defects identified, compatible with low probability of PE. Electronically Signed  By: Corrie Mckusick D.O.   On: 11/25/2021 12:18   US Venous Img Lower Bilateral (DVT)  Result Date: 11/25/2021 CLINICAL DATA:  Elevated D-dimer, prostate cancer EXAM: BILATERAL LOWER EXTREMITY VENOUS DOPPLER ULTRASOUND TECHNIQUE: Gray-scale sonography with compression, as well as color and duplex ultrasound, were performed to evaluate the deep venous system(s) from the level of the common femoral vein through the popliteal and proximal calf veins. COMPARISON:  None Available. FINDINGS: VENOUS The common femoral veins bilaterally are widely patent with appropriate antegrade flow, compressibility, demonstrate appropriate respiratory variation. Greater saphenous veins of the saphenofemoral junctions are patent bilaterally. The femoral veins and popliteal veins are widely patent with appropriate antegrade flow, compressibility, and augmentation. The right profundus femoral vein is patent with appropriate antegrade flow where visualized. The left peripheral mid femoral vein demonstrates acute appearing, near occlusive thrombus protruding into the common femoral venous confluence. There is occlusive DVT identified within the visualized posterior tibial and peroneal veins of the lower extremities bilaterally. This does not appear to extend in the popliteal veins on the presented images. OTHER None. Limitations: none  IMPRESSION: 1. Acute appearing, near occlusive DVT within the left profundus femoral vein protruding into the common femoral venous confluence without occlusion of this structure. 2. Occlusive DVT within the visualized posterior tibial and peroneal veins of the lower extremities bilaterally. This does not appear to extend in the popliteal veins on the presented images. 3. These results will be called to the ordering clinician or representative by the Radiologist Assistant, and communication documented in the PACS or [employer]. Electronically Signed   By: Fidela Salisbury M.D.   On: 11/25/2021 02:08   MR ANGIO HEAD WO CONTRAST  Result Date: 11/24/2021 CLINICAL DATA:  Stroke EXAM: MRA NECK WITHOUT CONTRAST MRA HEAD WITHOUT CONTRAST TECHNIQUE: Angiographic images of the Circle of Willis were acquired using MRA technique without intravenous contrast. COMPARISON:  MRI head 11/24/2021 FINDINGS: MRA NECK FINDINGS Carotid bifurcation widely patent without stenosis. Both vertebral arteries are patent with antegrade flow. No vertebral artery stenosis. MRA HEAD FINDINGS Internal carotid artery widely patent bilaterally without stenosis. Moderate stenosis left A2 segment. Mild stenosis right A2 segment. Middle cerebral arteries patent without stenosis. No vascular malformation Both vertebral arteries are patent to the basilar. Right PICA patent. Left PICA not visualized. AICA patent bilaterally. Basilar patent. Superior cerebellar and posterior cerebral arteries patent. Fetal origin right posterior cerebral artery. Mild stenosis distal right posterior cerebral artery. Mild stenosis distal posterior cerebral artery bilaterally. IMPRESSION: Negative MRA neck Moderate left anterior cerebral artery stenosis and mild right anterior cerebral artery stenosis Mild stenosis distal PCA bilaterally. No intracranial large vessel occlusion. Electronically Signed   By: Franchot Gallo M.D.   On: 11/24/2021 17:36   MR ANGIO NECK WO  CONTRAST  Result Date: 11/24/2021 CLINICAL DATA:  Stroke EXAM: MRA NECK WITHOUT CONTRAST MRA HEAD WITHOUT CONTRAST TECHNIQUE: Angiographic images of the Circle of Willis were acquired using MRA technique without intravenous contrast. COMPARISON:  MRI head 11/24/2021 FINDINGS: MRA NECK FINDINGS Carotid bifurcation widely patent without stenosis. Both vertebral arteries are patent with antegrade flow. No vertebral artery stenosis. MRA HEAD FINDINGS Internal carotid artery widely patent bilaterally without stenosis. Moderate stenosis left A2 segment. Mild stenosis right A2 segment. Middle cerebral arteries patent without stenosis. No vascular malformation Both vertebral arteries are patent to the basilar. Right PICA patent. Left PICA not visualized. AICA patent bilaterally. Basilar patent. Superior cerebellar and posterior cerebral arteries patent. Fetal origin right posterior cerebral artery. Mild  stenosis distal right posterior cerebral artery. Mild stenosis distal posterior cerebral artery bilaterally. IMPRESSION: Negative MRA neck Moderate left anterior cerebral artery stenosis and mild right anterior cerebral artery stenosis Mild stenosis distal PCA bilaterally. No intracranial large vessel occlusion. Electronically Signed   By: Franchot Gallo M.D.   On: 11/24/2021 17:36   MR BRAIN WO CONTRAST  Result Date: 11/24/2021 CLINICAL DATA:  Stroke, follow up EXAM: MRI HEAD WITHOUT CONTRAST TECHNIQUE: Multiplanar, multiecho pulse sequences of the brain and surrounding structures were obtained without intravenous contrast. COMPARISON:  CT head from the same day.  MRI Sep 14, 2021. FINDINGS: Brain: In comparison to MRI from Sep 14, 2021, new/interval infarct in the left occipital lobe (left PCA territory) with a few small areas of DWI hyperintensity in this region potentially representing evolving subacute infarct and/or artifact related to cortical laminar necrosis/petechial hemorrhage described below. Areas of T1  hyperintensity and extensive curvilinear susceptibility effect is region is compatible with laminar necrosis and/or petechial hemorrhage. No mass occupying acute hemorrhage. There is edema in this region without significant mass effect. No midline shift. No hydrocephalus or extra-axial fluid collection. Additional moderate scattered T2/FLAIR hyperintensities in the white matter, nonspecific but compatible with chronic microvascular ischemic disease. Small remote right cerebellar lacunar infarcts. Vascular: Major arterial flow voids are maintained skull base. Skull and upper cervical spine: Normal marrow signal. Sinuses/Orbits: Largely clear sinuses.  No acute orbital findings. Other: No mastoid effusions. IMPRESSION: 1. In comparison to MRI from Sep 14, 2021, new/interval infarct in the left occipital lobe (left PCA territory), which is likely subacute in chronicity and described above. Associated cortical laminar necrosis and/or petechial hemorrhage. 2. Moderate chronic microvascular ischemic disease. Electronically Signed   By: Margaretha Sheffield M.D.   On: 11/24/2021 16:54   CT Head Wo Contrast  Result Date: 11/24/2021 CLINICAL DATA:  Stroke suspected EXAM: CT HEAD WITHOUT CONTRAST TECHNIQUE: Contiguous axial images were obtained from the base of the skull through the vertex without intravenous contrast. RADIATION DOSE REDUCTION: This exam was performed according to the departmental dose-optimization program which includes automated exposure control, adjustment of the mA and/or kV according to patient size and/or use of iterative reconstruction technique. COMPARISON:  09/14/2021 FINDINGS: Brain: Moderate area of hypodensity in the left occipital lobe and inferior parietal lobe, which is new from the prior exam, with effacement of the sulci. Possible faint curvilinear hyperdensity in this region may represent petechial hemorrhage. No parenchymal hematoma. No mass, mass effect, or midline shift. No hydrocephalus  or additional extra-axial collection. Vascular: No definite hyperdense vessel. Skull: No acute osseous abnormality. Sinuses/Orbits: No acute finding. Other: The mastoids are well aerated. IMPRESSION: Hypodensity in the left occipital and inferior parietal lobe is new from the prior exam and concerning for acute and/or subacute infarct. Possible faint curvilinear hyperdensity in this region may represent petechial hemorrhage. No intraparenchymal hematoma or significant midline shift. These results were called by telephone at the time of interpretation on 11/24/2021 at 1:52 pm to provider [facility] , who verbally acknowledged these results. Electronically Signed   By: Merilyn Baba M.D.   On: 11/24/2021 13:53   DG Chest 2 View  Result Date: 11/24/2021 CLINICAL DATA:  Syncopal episode today. EXAM: CHEST - 2 VIEW COMPARISON:  Chest radiograph 09/14/2021; CT chest 09/14/2021. FINDINGS: The heart size and mediastinal contours are within normal limits. Trace pleural fluid at the posterior costophrenic angle. A 1.4 cm nodular opacity projects over the anterior aspect of the distal thoracic spine, seen on lateral  view only. No other focal consolidation. No pneumothorax. Multilevel degenerative changes in the mid to distal thoracic spine. IMPRESSION: A 1.4 cm nodular opacity projecting over the anterior distal thoracic spine could represent a pulmonary nodule versus overlapping osseous structures. Recommend CT chest for further evaluation. Trace pleural fluid at the posterior costophrenic angle. No other focal consolidation. Electronically Signed   By: Ileana Roup M.D.   On: 11/24/2021 13:19      Assessment/Plan 1.  Deep vein thrombosis  The patient is currently has an acute thrombus within the left profundus femoral extending into the common femoral veins.  There is also bilateral tibial vessel DVTs.  He was negative for DVT however given his CVA, neurology recommends holding anticoagulation for at least 1  week.  Anticoagulation.  Initiated per neurology recommendations. 2.  CVA The patient's MRI shows a new interval infarct in the left occipital lobe that is associated with petechial hemorrhage.  Neurology recommends no full anticoagulation for at least a week.  Based on this and given the patient's DVT it would be in his best interest to have an IVC filter placed.  I discussed the purpose of an IVC filter.  I have discussed the risk benefits and alternatives the patient and his family.  We discussed that the IVC filter can be removed in several weeks.  He is agreeable to proceed. 3.  Hypercholesterolemia Continue statin  Family Communication:  Thank you for allowing [place] to participate in the care of this patient.   Kris Hartmann, NP [facility] [phone] (Office Phone) [phone] (Office Fax) [phone] (Pager)  11/25/2021 10:14 PM  Staff may message me via secure chat in [place]  but this may not receive immediate response,  please page for urgent matters!  Dictation software was used to generate the above note. Typos may occur and escape review, as with typed/written notes. Any error is purely unintentional.  Please contact me directly for clarity if needed.

## 2021-11-25 NOTE — Progress Notes (Signed)
OT Cancellation Note  Patient Details Name: Mark [name] MRN: [id number] DOB: 1941-03-05   Cancelled Treatment:    Reason Eval/Treat Not Completed: Medical issues which prohibited therapy Therapy orders received and OT chart reviewed. Per NP note: "Will defer decision for anticoagulation or IVC filter to day team after consultation with neurology." Pt with noted DVT and per therapy protocol, OT to hold services for today. OT to follow up at next available service date depending on pt status.  Lanelle Bal  [facility] 11/25/2021, 8:33 AM

## 2021-11-25 NOTE — Assessment & Plan Note (Signed)
Blood cultures negative

## 2021-11-25 NOTE — Assessment & Plan Note (Signed)
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  Creatinine 1.65 on presentation down to 1.35 with a GFR 53 currently.

## 2021-11-25 NOTE — Progress Notes (Signed)
PHARMACIST - PHYSICIAN COMMUNICATION  CONCERNING:  Enoxaparin (Lovenox) for DVT Prophylaxis    RECOMMENDATION: Patient was prescribed enoxaprin '40mg'$  q24 hours for VTE prophylaxis.   Filed Weights   11/24/21 0932  Weight: 86.2 kg (190 lb)    Body mass index is 30.67 kg/m.  Estimated Creatinine Clearance: 44.9 mL/min (A) (by C-G formula based on SCr of 1.35 mg/dL (H)).   Based on [place] patient is candidate for enoxaparin 0.'5mg'$ /kg TBW SQ every 24 hours based on BMI being >30.   DESCRIPTION: Pharmacy has adjusted enoxaparin dose per [facility] policy.  Patient is now receiving enoxaparin 0.5 mg/kg mg every 24 hours   Glean Salvo, PharmD Clinical Pharmacist  11/25/2021 3:22 PM

## 2021-11-25 NOTE — Op Note (Signed)
[facility]   OPERATIVE NOTE    PRE-OPERATIVE DIAGNOSIS: DVT with intracranial hemorrhage  POST-OPERATIVE DIAGNOSIS: same as above  PROCEDURE: 1.   Ultrasound guidance for vascular access to the right vein 2.   Catheter placement into the inferior vena cava 3.   Inferior venacavogram 4.   Placement of a Option Elite IVC filter  SURGEON: Leotis Pain, MD  ASSISTANT(S): None  ANESTHESIA: local with Moderate Conscious Sedation for approximately 18 minutes using 1 mg of Versed and 50 mcg of Fentanyl  ESTIMATED BLOOD LOSS: minimal  CONTRAST: 15 cc  FLUORO TIME: less than one minute  FINDING(S): 1.  Patent IVC  SPECIMEN(S):  none  INDICATIONS:   Mark [name] is a 81 y.o. male who presents with a DVT and blood on the brain precluding anticoagulation for at least one week.  Inferior vena cava filter is indicated for this reason.  Risks and benefits including filter thrombosis, migration, fracture, bleeding, and infection were all discussed.  We discussed that all IVC filters that we place can be removed if desired from the patient once the need for the filter has passed.    DESCRIPTION: After obtaining full informed written consent, the patient was brought back to the vascular suite. The skin was sterilely prepped and draped in a sterile surgical field was created. Moderate conscious sedation was administered during a face to face encounter with the patient throughout the procedure with my supervision of the RN administering medicines and monitoring the patient's vital signs, pulse oximetry, telemetry and mental status throughout from the start of the procedure until the patient was taken to the recovery room. The right femoral vein was accessed under direct ultrasound guidance without difficulty with a Seldinger needle and a J-wire was then placed. After skin nick and dilatation, the delivery sheath was placed into the inferior vena cava and an inferior  venacavogram was performed. This demonstrated a patent IVC with the level of the renal veins at the top of L1.  The filter was then deployed into the inferior vena cava at the level of the bottom of L1 just below the renal veins. The delivery sheath was then removed. Pressure was held. Sterile dressings were placed. The patient tolerated the procedure well and was taken to the recovery room in stable condition.  COMPLICATIONS: None  CONDITION: Stable  Leotis Pain  11/25/2021, 4:42 PM   This note was created with Dragon Medical transcription system. Any errors in dictation are purely unintentional.

## 2021-11-25 NOTE — Assessment & Plan Note (Addendum)
Likely secondary to stroke.  Mental status improved.

## 2021-11-25 NOTE — Progress Notes (Signed)
PT Cancellation Note  Patient Details Name: ELISHAH ASHMORE MRN: [id number] DOB: 09/18/1940   Cancelled Treatment:     PT attempted pt twice. Pt not evaluated and treated today because pt was awaiting delivery of the LSO and then was taken to for a procedure  of IVC filter placement. Pt has received his LSO. Pt will be consulted tomorrow as indicated.    Joaquin Music PT DPT 2:39 PM,11/25/21

## 2021-11-25 NOTE — Progress Notes (Signed)
Orthopedic Tech Progress Note Patient Details:  Mark [name] 20-Nov-1940 [id number]  Called in order to [facility] for an LSO BRACE.  Patient ID: [name], male   DOB: March 01, 1941, 81 y.o.   MRN: [id number]  Janit Pagan 11/25/2021, 8:52 AM

## 2021-11-25 NOTE — Progress Notes (Signed)
Family returned to pt's room, pt declined refreshments

## 2021-11-26 ENCOUNTER — Encounter: Payer: Self-pay | Admitting: Vascular Surgery

## 2021-11-26 ENCOUNTER — Inpatient Hospital Stay: Payer: No Typology Code available for payment source

## 2021-11-26 LAB — BASIC METABOLIC PANEL
Anion gap: 6 (ref 5–15)
BUN: 12 mg/dL (ref 8–23)
CO2: 25 mmol/L (ref 22–32)
Calcium: 8.3 mg/dL — ABNORMAL LOW (ref 8.9–10.3)
Chloride: 111 mmol/L (ref 98–111)
Creatinine, Ser: 1.32 mg/dL — ABNORMAL HIGH (ref 0.61–1.24)
GFR, Estimated: 55 mL/min — ABNORMAL LOW (ref >60)
Glucose, Bld: 83 mg/dL (ref 70–99)
Potassium: 4.2 mmol/L (ref 3.5–5.1)
Sodium: 142 mmol/L (ref 135–145)

## 2021-11-26 LAB — CBC
HCT: 27.9 % — ABNORMAL LOW (ref 39.0–52.0)
Hemoglobin: 8.4 g/dL — ABNORMAL LOW (ref 13.0–17.0)
MCH: 27.7 pg (ref 26.0–34.0)
MCHC: 30.1 g/dL (ref 30.0–36.0)
MCV: 92.1 fL (ref 80.0–100.0)
Platelets: 233 10*3/uL (ref 150–400)
RBC: 3.03 MIL/uL — ABNORMAL LOW (ref 4.22–5.81)
RDW: 13.6 % (ref 11.5–15.5)
WBC: 6 10*3/uL (ref 4.0–10.5)
nRBC: 0 % (ref 0.0–0.2)

## 2021-11-26 MED ORDER — POLYETHYLENE GLYCOL 3350 17 G PO PACK
17.0000 g | PACK | Freq: Every day | ORAL | Status: DC
  Administered 2021-11-26 – 2021-11-30 (×5): 17 g via ORAL
  Filled 2021-11-26 (×5): qty 1

## 2021-11-26 NOTE — Evaluation (Addendum)
Occupational Therapy Evaluation Patient Details Name: Mark [name] MRN: [id number] DOB: 02-28-1941 Today's Date: 11/26/2021   History of Present Illness Pt is an 81 yo male that presented to the ED for syncope, AMS. 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 PMH of hypertension, hyperlipidemia, CAD, CKD, prostate cancer s/p radiation.   Clinical Impression   Patient seen for OT evaluation this date. Patient presenting with back pain, impaired vision, decreased cognition, and impaired balance impacting safety and independence in ADLs. Pt was requiring assistance for ADLs, IADLs, and functional mobility with a RW or w/c PTA. Pt able to complete bed mobility with supervision and VC, functional transfers with Min guard + RW, and LB dressing with supervision in sitting. Pt was educated on how to don/doff LSO brace. Patient will benefit from acute OT to increase overall independence in the areas of ADLs and functional mobility. OT recommends SNF at discharge to maximize independence and safety with ADLs.     Recommendations for follow up therapy are one component of a multi-disciplinary discharge planning process, led by the attending physician.  Recommendations may be updated based on patient status, additional functional criteria and insurance authorization.   Follow Up Recommendations  Skilled nursing-short term rehab (<3 hours/day)    Assistance Recommended at Discharge Frequent or constant Supervision/Assistance  Patient can return home with the following A lot of help with bathing/dressing/bathroom;Assistance with cooking/housework;Assistance with feeding;Direct supervision/assist for financial management;Direct supervision/assist for medications management;Assist for transportation;A lot of help with  walking and/or transfers    Functional Status Assessment  Patient has had a recent decline in their functional status and demonstrates the ability to make significant improvements in function in a reasonable and predictable amount of time.  Equipment Recommendations  Other (comment) (defer to next venue of care)           Precautions / Restrictions Precautions Precautions: Fall Weight Bearing Restrictions: No      Mobility Bed Mobility Overal bed mobility: Needs Assistance Bed Mobility: Supine to Sit     Supine to sit: Supervision, HOB elevated     General bed mobility comments: with VC and use of bed rails Patient Response: Cooperative  Transfers Overall transfer level: Needs assistance Equipment used: Rolling walker (2 wheels) Transfers: Sit to/from Stand, Bed to chair/wheelchair/BSC Sit to Stand: Min guard, +2 safety/equipment Stand pivot transfers: Min guard, +2 safety/equipment         General transfer comment: mild posterior LOB upon standing, required Min A to corrent      Balance Overall balance assessment: Needs assistance Sitting-balance support: Feet supported Sitting balance-Leahy Scale: Good     Standing balance support: Reliant on assistive device for balance, During functional activity, Bilateral upper extremity supported Standing balance-Leahy Scale: Fair Standing balance comment: mild posterior LOB upon standing, required Min A to correct                           ADL either performed or assessed with clinical judgement   ADL Overall ADL's : Needs assistance/impaired                     Lower Body Dressing: Sitting/lateral leans;Supervision/safety Lower Body Dressing Details (indicate cue type and reason): Pt able to adjust socks while sitting EOB, initiated task after one VC  from therapist             Functional mobility during ADLs: Rolling walker (2 wheels);Min guard       Vision Patient Visual Report:  Diplopia Additional Comments: Double vision noted per pt report and during functional activities when reaching for RW with R hand. Physician entered room and had to end assessment early. Per physician neurology note on 11/25/21, "he has a dense right hemianopia."   Vision- Assessment: Vision impaired- to be further tested in functional context             Pertinent Vitals/Pain Pain Assessment Pain Assessment: No/denies pain     Hand Dominance Right   Extremity/Trunk Assessment Upper Extremity Assessment Upper Extremity Assessment: Generalized weakness RUE Deficits / Details: grip strength fair, UE AROM WFL for shoulder flexion, elbow fex/ext, wrist flex/ext LUE Deficits / Details: L residual weakness from past CVA, grip strength fair, UE AROM WFL for shoulder flexion, elbow fex/ext, wrist flex/ext   Lower Extremity Assessment Lower Extremity Assessment: RLE deficits/detail;LLE deficits/detail RLE Deficits / Details: R knee 4/5, hip 3-/5, ankle 4/5 RLE Sensation: WNL RLE Coordination: WNL LLE Deficits / Details: L knee 3/5, hip 2+/5, ankle 4/5 LLE Sensation: WNL LLE Coordination: WNL   Cervical / Trunk Assessment Cervical / Trunk Assessment: Normal   Communication Communication Communication: Receptive difficulties   Cognition Arousal/Alertness: Awake/alert Behavior During Therapy: WFL for tasks assessed/performed Overall Cognitive Status: Difficult to assess                                 General Comments: Cognitive deficits noted during evaluation, some difficulty with STM, oriented to self and place. Pt able to engage in conversation throughout, good attention to task, able to answer questions but some difficulty with word finding. Increased time and repetition for one step commands.                      Home Living Family/patient expects to be discharged to:: Private residence Living Arrangements: Spouse/significant other Available Help at  Discharge: Family;Available 24 hours/day (Wife no longer working, home with pt 24/7) Type of Home: House Home Access: Stairs to enter [employer] of Steps: 1 Entrance Stairs-Rails: None Home Layout: One level     Bathroom Shower/Tub: Tub/shower unit;Walk-in shower   Bathroom Toilet: Standard Bathroom Accessibility: Yes   Home Equipment: Grab bars - tub/shower;Rolling Walker (2 wheels);Cane - single point;Wheelchair - manual (lift chair)      Lives With: Spouse    Prior Functioning/Environment Prior Level of Function : Independent/Modified Independent             Mobility Comments: No AD at baseline, pt required assistance with getting out of bed from wife. Since discharging home from [facility] (<1 week ago), pt has been able to walk short distances with RW and transfer to w/c. ADLs Comments: Independent/Mod I when last seen on 09/15/21, however, per wife pt needs physical assistance with all ADLs. Wife reports with pt's back pain recently that no position could relieve it (unable to lay down), not sleeping well. Pt with inability to get up right before coming to hospital, wife unsuccessful with helping pt get up and had to call EMS.        OT Problem List: Decreased strength;Decreased activity tolerance;Impaired balance (sitting and/or standing);Decreased safety awareness;Pain;Impaired vision/perception;Decreased cognition;Decreased coordination      OT Treatment/Interventions: Self-care/ADL training;Visual/perceptual remediation/compensation;Patient/family education;Therapeutic exercise;Therapeutic activities;Energy  conservation;DME and/or AE instruction;Cognitive remediation/compensation    OT Goals(Current goals can be found in the care plan section) Acute Rehab OT Goals Patient Stated Goal: get stronger OT Goal Formulation: With patient/family Time For Goal Achievement: 12/10/21 Potential to Achieve Goals: Fair ADL Goals Pt Will Perform Eating: with adaptive  utensils;with set-up;with supervision Pt Will Perform Grooming: sitting;with set-up Pt Will Perform Upper Body Dressing: sitting;with set-up Pt Will Perform Lower Body Dressing: sitting/lateral leans;with set-up Pt Will Transfer to Toilet: bedside commode;with supervision Pt Will Perform Toileting - Clothing Manipulation and hygiene: sit to/from stand;with supervision  OT Frequency: Min 2X/week                  AM-PAC OT "6 Clicks" Daily Activity     Outcome Measure Help from another person eating meals?: A Little Help from another person taking care of personal grooming?: A Lot Help from another person toileting, which includes using toliet, bedpan, or urinal?: A Lot Help from another person bathing (including washing, rinsing, drying)?: A Lot Help from another person to put on and taking off regular upper body clothing?: A Little Help from another person to put on and taking off regular lower body clothing?: A Lot 6 Click Score: 14   End of Session Equipment Utilized During Treatment: Gait belt;Rolling walker (2 wheels) Nurse Communication: Mobility status  Activity Tolerance: Patient tolerated treatment well;Patient limited by fatigue Patient left: in bed;with call bell/phone within reach;with family/visitor present  OT Visit Diagnosis: Unsteadiness on feet (R26.81);Pain;Muscle weakness (generalized) (M62.81);Cognitive communication deficit (R41.841) Symptoms and signs involving cognitive functions:  (left occipital infarct) Pain - part of body:  (back)                Time: 4332-9518 OT Time Calculation (min): 33 min Charges:  OT General Charges $OT Visit: 1 Visit OT Evaluation $OT Eval Low Complexity: 1 Low  Zeeva Courser MS, OTR/L ascom [phone]  11/26/21, 1:32 PM

## 2021-11-26 NOTE — Plan of Care (Signed)
  Problem: Education: Goal: Knowledge of General Education information will improve Description: Including pain rating scale, medication(s)/side effects and non-pharmacologic comfort measures Outcome: Progressing   Problem: Health Behavior/Discharge Planning: Goal: Ability to manage health-related needs will improve Outcome: Progressing   Problem: Clinical Measurements: Goal: Ability to maintain clinical measurements within normal limits will improve Outcome: Progressing Goal: Will remain free from infection Outcome: Progressing Goal: Diagnostic test results will improve Outcome: Progressing Goal: Respiratory complications will improve Outcome: Progressing Goal: Cardiovascular complication will be avoided Outcome: Progressing   Problem: Activity: Goal: Risk for activity intolerance will decrease Outcome: Progressing   Problem: Nutrition: Goal: Adequate nutrition will be maintained Outcome: Progressing   Problem: Coping: Goal: Level of anxiety will decrease Outcome: Progressing   Problem: Elimination: Goal: Will not experience complications related to bowel motility Outcome: Progressing Goal: Will not experience complications related to urinary retention Outcome: Progressing   Problem: Pain Managment: Goal: General experience of comfort will improve Outcome: Progressing   Problem: Safety: Goal: Ability to remain free from injury will improve Outcome: Progressing   Problem: Education: Goal: Knowledge of disease or condition will improve Outcome: Progressing Goal: Knowledge of secondary prevention will improve (SELECT ALL) Outcome: Progressing Goal: Knowledge of patient specific risk factors will improve (INDIVIDUALIZE FOR PATIENT) Outcome: Progressing Goal: Individualized Educational Video(s) Outcome: Progressing   Problem: Coping: Goal: Will verbalize positive feelings about self Outcome: Progressing Goal: Will identify appropriate support needs Outcome:  Progressing   Problem: Health Behavior/Discharge Planning: Goal: Ability to manage health-related needs will improve Outcome: Progressing   Problem: Self-Care: Goal: Ability to participate in self-care as condition permits will improve Outcome: Progressing Goal: Verbalization of feelings and concerns over difficulty with self-care will improve Outcome: Progressing Goal: Ability to communicate needs accurately will improve Outcome: Progressing   Problem: Nutrition: Goal: Risk of aspiration will decrease Outcome: Progressing Goal: Dietary intake will improve Outcome: Progressing   Problem: Intracerebral Hemorrhage Tissue Perfusion: Goal: Complications of Intracerebral Hemorrhage will be minimized Outcome: Progressing   Problem: Ischemic Stroke/TIA Tissue Perfusion: Goal: Complications of ischemic stroke/TIA will be minimized Outcome: Progressing   Problem: Spontaneous Subarachnoid Hemorrhage Tissue Perfusion: Goal: Complications of Spontaneous Subarachnoid Hemorrhage will be minimized Outcome: Progressing

## 2021-11-26 NOTE — Progress Notes (Signed)
Speech Language Pathology Treatment: Cognitive-Linquistic  Patient Details Name: Mark [name] MRN: [id number] DOB: 07/16/1940 Today's Date: 11/26/2021 Time: 9562-1308 SLP Time Calculation (min) (ACUTE ONLY): 28 min  Assessment / Plan / Recommendation Clinical Impression  Pt seen at bedside for skilled ST intervention targeting goals for effective communication. Pt was awake and alert, seated upright in recliner. No family present. Pt was pleasant and cooperative with unfamiliar therapist. Upon initial conversation, pt speech was fully intelligible and no obvious word finding deficits were noted. When challenged, pt accuracy decreases.   Receptive Language: Pt was able to answer simple yes/no questions and follow 1- and 2-step directions. Pt was 64% accurate with complex/abstract yes/no questions. Repetition required for accurate execution of 3-step commands. Right left discrimination was accurate except for identification of right/left on SLP (pt pointed to my right hand when asked to point to my left hand, and pointed to my left shoulder when asked to point to my right shoulder).  Expressive Language: Pt was able to produce automatic sequences without difficulty (days, months, counting to 20). He was able to repeat sentence length material error free. Pt was able to demonstrate sentence completion and answer responsive naming questions. Accuracy for naming items increased when pt held the items. He was unable to name TV, clock, or mirror, likely due to visual impairment. Pt completed verbal fluency task, naming 5 animals in 60 seconds (n=15+).  Anticipate pt will benefit from continued skilled ST intervention after acute hospitalization, to maximize functional and effective communication. SLP will continue to follow acutely.   HPI HPI: Pt  is a 81 y.o. 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  History is obtained mostly from his wife over the phone as the patient reports that he is here due to right-sided back pain and the year is 44.  This morning around 2 AM the patient had said he needed to go to the restroom, but was unable to get up.  He had been sleeping in his a lift chair as his back have been giving him trouble for quite some time.  His wife had given him a urinal to use and he had gone back to sleep.  He woke up 2 hours later and was stating that he had the get up.  She attempted to help him up, but was unable.  A family friend was able to get him up to the wheelchair later on that morning.  His wife states that the was eating breakfast and the next thing she noticed was his head was laid back and his eyes were rolled in the back of his head. She went to check on him and he did not respond at first.  He did not respond for 30 seconds or so, and then was just staring at her and seem like he was having trouble breathing.  She called EMS at that time.  He had last been hospitalized from 6/5-6/13 at [facility] where he was noted to have multifocal embolic infarcts related to MSSA bacteremia with a mitral valve vegetation.  A PICC line was placed and he was treated with nafcillin.  His wife notes that he completed the antibiotics on 7/26.  Following the stroke patient had issues with his vision, but his wife reported no other deficits.  MRI: In comparison to MRI from Sep 14, 2021, new/interval infarct in  the left occipital lobe (left PCA territory), which is likely  subacute in chronicity and described above. Associated cortical  laminar necrosis and/or petechial hemorrhage.  2. Moderate chronic microvascular ischemic disease.      SLP Plan  Continue with current plan of care      Recommendations for follow up therapy are one component of  a multi-disciplinary discharge planning process, led by the attending physician.  Recommendations may be updated based on patient status, additional functional criteria and insurance authorization.    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 Quentin Ore, [facility] [place] Speech Language Pathologist  Shonna Chock 11/26/2021, 1:50 PM

## 2021-11-26 NOTE — Progress Notes (Signed)
Progress Note   Patient: Mark [name] ELF:[id number] DOB: Jul 31, 1940 DOA: 11/24/2021     2 DOS: the patient was seen and examined on 11/26/2021   Brief hospital course: 81 year old man with history of hypertension hyperlipidemia recent treatment for MSSA bacteremia and completed IV antibiotic course.  History of prostate cancer and chronic low back pain.  Presented to the hospital after a syncopal episode.  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  The patient was also diagnosed with bilateral lower extremity DVT.  Neurology would like to hold off on major anticoagulation for 2 weeks.  IVC filter placed on 11/25/2021.  Assessment and Plan: * CVA (cerebral vascular accident) ([place]) MRI showing a new interval infarct in left occipital lobe which is likely subacute in chronicity and associated with cortical laminar necrosis and/or petechial hemorrhage.  Neurology cleared to be on aspirin.  LDL 99.  Patient on Crestor.  Case discussed with neurology and they would recommend not giving full anticoagulation for 2 weeks.  Physical therapy recommending rehab  DVT (deep venous thrombosis) (HCC) Acute appearing, near occlusive DVT with the left profundus femoral vein protruding into the common femoral veins.  Occlusive DVT visualized in posterior tibial and peroneal veins of the lower extremities bilaterally.  Perfusion scan negative for PE.  IVC filter placed by Dr. Lucky Cowboy on 11/25/2021.  Syncope Could be secondary to stroke and/or hypotension.  Blood pressure medications held.  Blood pressure stable.  Acute metabolic encephalopathy Likely secondary to stroke.  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  Creatinine 1.65 on presentation down to 1.32 with a GFR 55  currently.  History of bacteremia Blood cultures negative for 2 days.  Normocytic anemia Last hemoglobin 8.4.  Continue to monitor closely.  Discontinue IV fluids.  Abnormal chest x-ray 1.4 cm nodular opacity projecting over the anterior distal thoracic spine which could represent a pulmonary nodule versus overlapping osseous structures.  Of note, the patient did have a CT scan of the chest on 09/14/2021 a few months ago that was negative.  Lumbar radiculopathy Patient on gabapentin and oxycodone as needed.        Subjective: Patient feeling better and was sitting in the chair.  Able to straight leg raise.  No leg pain.  Diagnosed with acute stroke and DVT.  Physical Exam: Vitals:   11/26/21 0048 11/26/21 0503 11/26/21 0842 11/26/21 1200  BP: (!) 151/81 (!) 152/81 (!) 160/67 134/72  Pulse: 62 62 (!) 55 71  Resp: '16 13 16 17  '$ Temp: 98.9 F (37.2 C) 98.8 F (37.1 C) 99.2 F (37.3 C) 98.4 F (36.9 C)  TempSrc:      SpO2: 99% 100% 100% 97%  Weight:      Height:       Physical Exam HENT:     Head: Normocephalic.     Mouth/Throat:     Pharynx: No oropharyngeal exudate.  Eyes:     General: Lids are normal.     Conjunctiva/sclera: Conjunctivae normal.  Cardiovascular:     Rate and Rhythm: Normal rate and regular rhythm.     Heart sounds: Normal heart sounds, S1 normal and S2 normal.  Pulmonary:     Breath sounds: No decreased breath sounds, wheezing, rhonchi or rales.  Abdominal:  Palpations: Abdomen is soft.     Tenderness: There is no abdominal tenderness.  Musculoskeletal:     Right lower leg: Swelling present.     Left lower leg: Swelling present.  Skin:    General: Skin is warm.     Findings: No rash.  Neurological:     Mental Status: He is alert.     Comments: Able to straight leg raise.  Able to lift his arms up off the chair.  Answering questions much better today than yesterday.     Data Reviewed: Creatinine 1.32 with a GFR 55, hemoglobin  8.4  Family Communication: Spoke with wife at the bedside  Disposition: Status is: Inpatient Remains inpatient appropriate because: Physical therapy recommending rehab. Planned Discharge Destination: Rehab    Time spent: 28 minutes  Author: Loletha Grayer, MD 11/26/2021 4:35 PM  For on call review [email or web address].

## 2021-11-26 NOTE — Progress Notes (Signed)
Subjective: IVC filter was placed.     Exam: Vitals:   11/26/21 0842 11/26/21 1200  BP: (!) 160/67 134/72  Pulse: (!) 55 71  Resp: 16 17  Temp: 99.2 F (37.3 C) 98.4 F (36.9 C)  SpO2: 100% 97%   Gen: In bed, NAD Resp: non-labored breathing, no acute distress Abd: soft, nt  Neuro: MS: Awake, alert, interactive and appropriate. peech is fluent.  CN: Pupils equal round and reactive, extraocular movements intact. He has a dense right hemianopia.  Motor: 5/5 throughout Sensory:intact to LT  Pertinent Labs: LDL 99 A1c of 3.8  Impression: 81 year old male with a history of hypertension, hyperlipidemia, previous stroke who presents with subacute left PCA stroke.  He does have significant petechial hemorrhage, which is visible on CT, though already less visible now. Given that there is a significant need for anticoagulation and now frank hematoma, I think it could be resumed after a week from onset. I would stop antiplatelets when anti-coagulation is started.    Recommendations: - Prophylactic therapy-Antiplatelet med: Aspirin - dose '81mg'$   - Increase Crestor to 40 mg nightly - Hold anticoagulation until a week from onset(8/08)  - pt,ot,st - neurology will be available as needed.   Roland Rack, MD [facility] [phone]  If 7pm- 7am, please page neurology on call as listed in [place]

## 2021-11-26 NOTE — Evaluation (Signed)
Physical Therapy Evaluation Patient Details Name: Mark [name] MRN: [id number] DOB: 1940/11/28 Today's Date: 11/26/2021  History of Present Illness  Pt is an 81 yo male that presented to the ED for syncope, AMS. 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 PMH of hypertension, hyperlipidemia, CAD, CKD, prostate cancer s/p radiation.   Clinical Impression  Patient alert, agreeable to PT, finishing up OT session. Pt recently at SNF, prior to SNF admission was modI, but has needed assistance with ADLs and was working on advancing ambulation distance.  Pt exhibited decreased strength in BLE, L >R. He was able to sit <> stand with RW and CGA several times, no true LOB noted but pt did need cueing/redirection in the midst of the task. He ambulated two bouts of 40-63f, but did exhibited fatigue, decreased stride length, and decreased velocity. Chair follow for safety.  Overall the patient demonstrated deficits (see "PT Problem List") that impede the patient's functional abilities, safety, and mobility and would benefit from skilled PT intervention. Recommendation at this time is SNF due to current level of assistance needed and to maximize function, safety, and independence.      Recommendations for follow up therapy are one component of a multi-disciplinary discharge planning process, led by the attending physician.  Recommendations may be updated based on patient status, additional functional criteria and insurance authorization.  Follow Up Recommendations Skilled nursing-short term rehab (<3 hours/day) Can patient physically be transported by private vehicle: Yes    Assistance Recommended at Discharge Frequent or constant Supervision/Assistance  Patient can return home with the following  A little help  with walking and/or transfers;A little help with bathing/dressing/bathroom;Assistance with cooking/housework;Assist for transportation;Help with stairs or ramp for entrance;Direct supervision/assist for medications management;Direct supervision/assist for financial management    Equipment Recommendations None recommended by PT  Recommendations for Other Services       Functional Status Assessment Patient has had a recent decline in their functional status and demonstrates the ability to make significant improvements in function in a reasonable and predictable amount of time.     Precautions / Restrictions Precautions Precautions: Fall Restrictions Weight Bearing Restrictions: No      Mobility  Bed Mobility               General bed mobility comments: pt up in recliner at start/end of session    Transfers Overall transfer level: Needs assistance Equipment used: Rolling walker (2 wheels) Transfers: Sit to/from Stand Sit to Stand: Min guard           General transfer comment: no LOB noted but pt does need cues to redirect/attend to task    Ambulation/Gait Ambulation/Gait assistance: Min guard Gait Distance (Feet):  ([phone] [id number] device: Rolling walker (2 wheels)         General Gait Details: pt did demonstrate fatigue over distance as well as decreased stride length. chair follow for safety  Stairs            Wheelchair Mobility    Modified Rankin (Stroke Patients Only)       Balance Overall balance assessment: Needs assistance Sitting-balance support: Feet supported Sitting balance-Leahy Scale: Good Sitting balance - Comments: seated pericare with supervision   Standing balance support: Reliant on assistive device for balance Standing balance-Leahy Scale: Fair  Pertinent Vitals/Pain Pain Assessment Pain Assessment: Faces Faces Pain Scale: Hurts little more    Home Living                           Prior Function                       Hand Dominance        Extremity/Trunk Assessment   Upper Extremity Assessment Upper Extremity Assessment: Generalized weakness    Lower Extremity Assessment Lower Extremity Assessment: RLE deficits/detail;LLE deficits/detail RLE Deficits / Details: R knee 4/5, hip 3-/5, ankle 4/5 RLE Sensation: WNL RLE Coordination: WNL LLE Deficits / Details: L knee 3/5, hip 2+/5, ankle 4/5 LLE Sensation: WNL LLE Coordination: WNL    Cervical / Trunk Assessment Cervical / Trunk Assessment: Normal  Communication      Cognition Arousal/Alertness: Awake/alert Behavior During Therapy: WFL for tasks assessed/performed Overall Cognitive Status: Difficult to assess                                 General Comments: pt oriented to self, place, but did display some STM deficits, situational awareness deficits        General Comments      Exercises     Assessment/Plan    PT Assessment Patient needs continued PT services  PT Problem List Decreased mobility;Decreased strength;Decreased balance;Decreased activity tolerance       PT Treatment Interventions DME instruction;Therapeutic exercise;Gait training;Balance training;Stair training;Neuromuscular re-education;Functional mobility training;Therapeutic activities;Patient/family education    PT Goals (Current goals can be found in the Care Plan section)  Acute Rehab PT Goals Patient Stated Goal: to get stronger PT Goal Formulation: With patient Time For Goal Achievement: 12/10/21 Potential to Achieve Goals: Good    Frequency Min 2X/week     Co-evaluation               AM-PAC PT "6 Clicks" Mobility  Outcome Measure Help needed turning from your back to your side while in a flat bed without using bedrails?: A Little Help needed moving from lying on your back to sitting on the side of a flat bed without using bedrails?: A Little Help needed moving  to and from a bed to a chair (including a wheelchair)?: A Little Help needed standing up from a chair using your arms (e.g., wheelchair or bedside chair)?: A Little Help needed to walk in hospital room?: A Little Help needed climbing 3-5 steps with a railing? : A Lot 6 Click Score: 17    End of Session Equipment Utilized During Treatment:  (LSO) Activity Tolerance: Patient tolerated treatment well Patient left: in chair;with chair alarm set;with call bell/phone within reach;with family/visitor present Nurse Communication: Mobility status PT Visit Diagnosis: Other abnormalities of gait and mobility (R26.89)    Time: 1443-1540 PT Time Calculation (min) (ACUTE ONLY): 24 min   Charges:   PT Evaluation $PT Eval Low Complexity: 1 Low PT Treatments $Therapeutic Activity: 8-22 mins        Lieutenant Diego PT, DPT 12:34 PM,11/26/21

## 2021-11-26 NOTE — Hospital Course (Addendum)
80 year old man with history of hypertension hyperlipidemia recent treatment for MSSA bacteremia and completed IV antibiotic course.  History of prostate cancer and chronic low back pain.  Presented to the hospital after a syncopal episode.  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  The patient was also diagnosed with bilateral lower extremity DVT.  Neurology would like to hold off on major anticoagulation for 2 weeks.  IVC filter placed on 11/25/2021.  TOC looking into rehabs but unable to find a rehab.  Patient did better with physical therapy and the plan is to go home with home health.

## 2021-11-27 NOTE — Progress Notes (Signed)
Mobility Specialist - Progress Note    11/27/21 0807  Mobility  Activity Transferred from bed to chair  Level of Assistance Moderate assist, patient does 50-74%  Assistive Device Front wheel walker  Activity Response Tolerated fair  $Mobility charge 1 Mobility    RN present upon entering assisting with sitting pt EOB --- pt grimacing due to severe pain in back. Author assists with remainder of session. Pt agreeable to finish transfer with heavy encouragement, but voicing pain throughout. Completes bed mobility ModA d/t pain and needs UE assist to remain sitting EOB. STS MinA and ambulates 21f to chair CGA. Pt is left in recliner with needs in reach, RN present.   MMerrily BrittleMobility Specialist 11/27/21, 8:10 AM

## 2021-11-27 NOTE — Plan of Care (Signed)
  Problem: Education: Goal: Knowledge of General Education information will improve Description: Including pain rating scale, medication(s)/side effects and non-pharmacologic comfort measures Outcome: Progressing   Problem: Health Behavior/Discharge Planning: Goal: Ability to manage health-related needs will improve Outcome: Progressing   Problem: Clinical Measurements: Goal: Ability to maintain clinical measurements within normal limits will improve Outcome: Progressing   Problem: Clinical Measurements: Goal: Ability to maintain clinical measurements within normal limits will improve Outcome: Progressing Goal: Will remain free from infection Outcome: Progressing Goal: Diagnostic test results will improve Outcome: Progressing Goal: Respiratory complications will improve Outcome: Progressing Goal: Cardiovascular complication will be avoided Outcome: Progressing   Problem: Activity: Goal: Risk for activity intolerance will decrease Outcome: Progressing   Problem: Nutrition: Goal: Adequate nutrition will be maintained Outcome: Progressing   Problem: Coping: Goal: Level of anxiety will decrease Outcome: Progressing   Problem: Elimination: Goal: Will not experience complications related to bowel motility Outcome: Progressing Goal: Will not experience complications related to urinary retention Outcome: Progressing   Problem: Pain Managment: Goal: General experience of comfort will improve Outcome: Progressing   Problem: Safety: Goal: Ability to remain free from injury will improve Outcome: Progressing   Problem: Skin Integrity: Goal: Risk for impaired skin integrity will decrease Outcome: Progressing   Problem: Education: Goal: Knowledge of disease or condition will improve Outcome: Progressing Goal: Knowledge of secondary prevention will improve (SELECT ALL) Outcome: Progressing Goal: Knowledge of patient specific risk factors will improve (INDIVIDUALIZE FOR  PATIENT) Outcome: Progressing Goal: Individualized Educational Video(s) Outcome: Progressing   Problem: Coping: Goal: Will verbalize positive feelings about self Outcome: Progressing Goal: Will identify appropriate support needs Outcome: Progressing   Problem: Health Behavior/Discharge Planning: Goal: Ability to manage health-related needs will improve Outcome: Progressing   Problem: Self-Care: Goal: Ability to participate in self-care as condition permits will improve Outcome: Progressing Goal: Verbalization of feelings and concerns over difficulty with self-care will improve Outcome: Progressing Goal: Ability to communicate needs accurately will improve Outcome: Progressing   Problem: Nutrition: Goal: Risk of aspiration will decrease Outcome: Progressing Goal: Dietary intake will improve Outcome: Progressing   Problem: Ischemic Stroke/TIA Tissue Perfusion: Goal: Complications of ischemic stroke/TIA will be minimized Outcome: Progressing   Problem: Spontaneous Subarachnoid Hemorrhage Tissue Perfusion: Goal: Complications of Spontaneous Subarachnoid Hemorrhage will be minimized Outcome: Progressing   Problem: Intracerebral Hemorrhage Tissue Perfusion: Goal: Complications of Intracerebral Hemorrhage will be minimized Outcome: Progressing   Problem: Spontaneous Subarachnoid Hemorrhage Tissue Perfusion: Goal: Complications of Spontaneous Subarachnoid Hemorrhage will be minimized Outcome: Progressing

## 2021-11-27 NOTE — TOC Initial Note (Signed)
Transition of Care [facility]) - Initial/Assessment Note    Patient Details  Name: Mark [name] MRN: [id number] Date of Birth: 26-Oct-1940  [facility] CM/SW Contact:    Laurena Slimmer, RN Phone Number: 11/27/2021, 1:44 PM  Clinical Narrative:                 Spoke with patient and wife at bedside. Patient was recently at [facility]. Patient's wife prefers any facility other than [facility] . Will begin bed search.         Patient Goals and CMS Choice        Expected Discharge Plan and Services                                                Prior Living Arrangements/Services                       Activities of Daily Living Home Assistive Devices/Equipment: Gilford Rile (specify type) ADL Screening (condition at time of admission) Patient's cognitive ability adequate to safely complete daily activities?: Yes Is the patient deaf or have difficulty hearing?: Yes Does the patient have difficulty seeing, even when wearing glasses/contacts?: Yes Does the patient have difficulty concentrating, remembering, or making decisions?: Yes Patient able to express need for assistance with ADLs?: Yes Does the patient have difficulty dressing or bathing?: Yes Independently performs ADLs?: No Communication: Independent Dressing (OT): Needs assistance Is this a change from baseline?: Change from baseline, expected to last <3days Grooming: Needs assistance Is this a change from baseline?: Change from baseline, expected to last <3 days Feeding: Independent Bathing: Needs assistance Is this a change from baseline?: Change from baseline, expected to last <3 days Toileting: Needs assistance Is this a change from baseline?: Change from baseline, expected to last <3 days In/Out Bed: Needs assistance Is this a change from baseline?: Change from baseline, expected to last <3 days Walks in Home: Needs assistance Is this a change from baseline?: Change from baseline, expected to  last <3 days Does the patient have difficulty walking or climbing stairs?: Yes Weakness of Legs: Both Weakness of Arms/Hands: None  Permission Sought/Granted                  Emotional Assessment              Admission diagnosis:  Syncope and collapse [R55] CVA (cerebral vascular accident) ([place]) [I63.9] AKI (acute kidney injury) ([place]) [N17.9] Cerebrovascular accident (CVA), unspecified mechanism ([place]) [I63.9] Patient Active Problem List   Diagnosis Date Noted   DVT (deep venous thrombosis) ([place]) 11/25/2021   CVA (cerebral vascular accident) ([place]) 11/24/2021   Syncope 38/93/7342   Acute metabolic encephalopathy 87/68/1157   Normocytic anemia 11/24/2021   History of bacteremia 11/24/2021   Abnormal chest x-ray 11/24/2021   Lumbar radiculopathy 11/24/2021   Hypokalemia 09/16/2021   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   Leukocytosis 09/15/2021   Acute kidney injury superimposed on chronic kidney disease ([place]) 09/15/2021   Fever 09/15/2021   Hypertension 02/02/2016   Hypercholesterolemia 02/02/2016   Left shoulder pain 02/02/2016   Encounter for pre-employment health screening examination 02/02/2016   Malignant neoplasm of prostate ([place]) 07/05/2011   PCP:  Donald Prose, MD Pharmacy:   [id number] [id number]  Phone: [phone] Fax: [phone]     Social Determinants of Health (SDOH) Interventions    Readmission Risk Interventions     No data to display

## 2021-11-27 NOTE — Progress Notes (Addendum)
Speech Language Pathology Treatment: Cognitive-Linquistic  Patient Details Name: Mark [name] MRN: [id number] DOB: 1941-04-05 Today's Date: 11/27/2021 Time: 3149-7026 SLP Time Calculation (min) (ACUTE ONLY): 35 min  Assessment / Plan / Recommendation Clinical Impression  Treatment today focused on receptive language tasks. Wife present and supportive. Wife and Pt report good improvement over past few days. Speech intelligible and appropriate. Wife reports expression is god but Pt is still having trouble with some comprehension. Today, Pt was able to answer simple Y/N questions with 100 percent acc and moderate Y/N with 85 percent acc. Some delay noted with repetition of question needed prior to answering. Pt also answered If questions with 90 percent was able to complete/ fix nonsensicle statements with 80 percetn acc. Performance of tasks was much better at the beginning of the session and Pt became less accurate as he showed fatigue. Attempted some object naming tasks however vision limited abilities [place] tasks. Pt is motivated to [facility] and continue to improve.Rec continue with further language assessment and treatment once Pt has moved to rehab facility.   HPI HPI: Pt  is a 81 y.o. 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  History is obtained mostly from his wife over the phone as the patient reports that he is here due to right-sided back pain and the year is 69.  This morning around 2 AM the patient had said he needed to go to the restroom, but was unable to get up.  He had been sleeping in his a lift chair as his back have been giving him trouble for quite some time.  His wife had given him a urinal to use and he had gone back to  sleep.  He woke up 2 hours later and was stating that he had the get up.  She attempted to help him up, but was unable.  A family friend was able to get him up to the wheelchair later on that morning.  His wife states that the was eating breakfast and the next thing she noticed was his head was laid back and his eyes were rolled in the back of his head. She went to check on him and he did not respond at first.  He did not respond for 30 seconds or so, and then was just staring at her and seem like he was having trouble breathing.  She called EMS at that time.  He had last been hospitalized from 6/5-6/13 at [facility] where he was noted to have multifocal embolic infarcts related to MSSA bacteremia with a mitral valve vegetation.  A PICC line was placed and he was treated with nafcillin.  His wife notes that he completed the antibiotics on 7/26.  Following the stroke patient had issues with his vision, but his wife reported no other deficits.   MRI: In comparison to MRI from Sep 14, 2021, new/interval infarct in  the left occipital lobe (left PCA territory), which is likely  subacute in chronicity and described above. Associated cortical  laminar necrosis and/or petechial hemorrhage.  2. Moderate chronic microvascular ischemic disease.      SLP Plan  Continue with current plan of care      Recommendations for follow up therapy are one component of a multi-disciplinary discharge planning process, led by the attending  physician.  Recommendations may be updated based on patient status, additional functional criteria and insurance authorization.    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

## 2021-11-27 NOTE — NC FL2 (Signed)
[place] LEVEL OF CARE SCREENING TOOL     IDENTIFICATION  Patient Name: Mark [name] Birthdate: 02-Jan-1941 Sex: male Admission Date (Current Location): 11/24/2021  [facility] and [place] Number:  Engineering geologist and Address:  [facility], [address] [place], [place] [address]      Provider Number: [id number]  Attending Physician Name and Address:  Enzo Bi, MD  Relative Name and Phone Number:  Angelene [phone]    Current Level of Care: Hospital Recommended Level of Care: [place] Prior Approval Number:    Date Approved/Denied:   PASRR Number: [id number]  Discharge Plan: SNF    Current Diagnoses: Patient Active Problem List   Diagnosis Date Noted   DVT (deep venous thrombosis) ([place]) 11/25/2021   CVA (cerebral vascular accident) ([place]) 11/24/2021   Syncope 40/97/3532   Acute metabolic encephalopathy 99/24/2683   Normocytic anemia 11/24/2021   History of bacteremia 11/24/2021   Abnormal chest x-ray 11/24/2021   Lumbar radiculopathy 11/24/2021   Hypokalemia 09/16/2021   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   Leukocytosis 09/15/2021   Acute kidney injury superimposed on chronic kidney disease ([place]) 09/15/2021   Fever 09/15/2021   Hypertension 02/02/2016   Hypercholesterolemia 02/02/2016   Left shoulder pain 02/02/2016   Encounter for pre-employment health screening examination 02/02/2016   Malignant neoplasm of prostate ([place]) 07/05/2011    Orientation RESPIRATION BLADDER Height & Weight     Self, Time  Normal Continent Weight: 86.2 kg Height:  '5\' 6"'$  (167.6 cm)  BEHAVIORAL SYMPTOMS/MOOD NEUROLOGICAL BOWEL NUTRITION STATUS  Other (Comment) (n/a)  (n/a) Continent Diet (Heart)  AMBULATORY STATUS COMMUNICATION OF NEEDS Skin   Limited Assist Verbally Normal                       Personal Care Assistance Level of Assistance  Bathing, Dressing Bathing Assistance: Limited  assistance   Dressing Assistance: Limited assistance     Functional Limitations Info             SPECIAL CARE FACTORS FREQUENCY  PT (By licensed PT), OT (By licensed OT)     PT Frequency: Min 2X/week OT Frequency: Min 2X/week            Contractures Contractures Info: Not present    Additional Factors Info  Code Status, Allergies Code Status Info: FULL Allergies Info: Atorvastatin           Current Medications (11/27/2021):  This is the current hospital active medication list Current Facility-Administered Medications  Medication Dose Route Frequency Provider Last Rate Last Admin   acetaminophen (TYLENOL) tablet 650 mg  650 mg Oral Q4H PRN Fuller Plan A, MD       Or   acetaminophen (TYLENOL) 160 MG/5ML solution 650 mg  650 mg Per Tube Q4H PRN Fuller Plan A, MD       Or   acetaminophen (TYLENOL) suppository 650 mg  650 mg Rectal Q4H PRN Fuller Plan A, MD       aspirin EC tablet 81 mg  81 mg Oral Daily Tamala Julian, Rondell A, MD   81 mg at 11/27/21 0807   enoxaparin (LOVENOX) injection 42.5 mg  0.5 mg/kg Subcutaneous Q24H Mickeal Skinner A, RPH   42.5 mg at 11/26/21 2151   gabapentin (NEURONTIN) capsule 100 mg  100 mg Oral QHS Smith, Rondell A, MD   100 mg at 11/26/21 2152   hydrALAZINE (APRESOLINE) injection 10 mg  10 mg Intravenous Q4H  PRN Fuller Plan A, MD   10 mg at 11/27/21 0820   lidocaine (XYLOCAINE) 5 % ointment 1 Application  1 Application Topical PRN Fuller Plan A, MD       ondansetron (ZOFRAN) injection 4 mg  4 mg Intravenous Q6H PRN Kris Hartmann, NP       oxyCODONE-acetaminophen (PERCOCET/ROXICET) 5-325 MG per tablet 1 tablet  1 tablet Oral Q6H PRN Fuller Plan A, MD   1 tablet at 11/27/21 0810   polyethylene glycol (MIRALAX / GLYCOLAX) packet 17 g  17 g Oral Daily Loletha Grayer, MD   17 g at 11/27/21 0807   rosuvastatin (CRESTOR) tablet 40 mg  40 mg Oral Daily Greta Doom, MD   40 mg at 11/27/21 3567   terazosin (HYTRIN) capsule  5 mg  5 mg Oral QHS Fuller Plan A, MD   5 mg at 11/26/21 2152     Discharge Medications: Please see discharge summary for a list of discharge medications.  Relevant Imaging Results:  Relevant Lab Results:   Additional Information Patient's SS# [id number]  Laurena Slimmer, RN

## 2021-11-27 NOTE — Progress Notes (Signed)
Physical Therapy Treatment Patient Details Name: Mark [name] MRN: [id number] DOB: 1940/05/26 Today's Date: 11/27/2021   History of Present Illness Pt is an 81 yo male that presented to the ED for syncope, AMS. 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 PMH of hypertension, hyperlipidemia, CAD, CKD, prostate cancer s/p radiation.    PT Comments    Pt presents to PT in bed and agreeable to participate in therapy services. PT session today limited by pt pain during all therapeutic tasks, including amb where Pt limited to max of 8 ft, compared to 46f during prior sessions. Nursing notified. Pt appears to have significant short term memory deficits. Pt would benefit from skilled PT at SNF to address above deficits in functional mobility, activity tolerance, and balance to promote optimal return to PLOF.    Recommendations for follow up therapy are one component of a multi-disciplinary discharge planning process, led by the attending physician.  Recommendations may be updated based on patient status, additional functional criteria and insurance authorization.  Follow Up Recommendations  Skilled nursing-short term rehab (<3 hours/day) Can patient physically be transported by private vehicle: No   Assistance Recommended at Discharge Frequent or constant Supervision/Assistance  Patient can return home with the following A lot of help with walking and/or transfers;A lot of help with bathing/dressing/bathroom;Assist for transportation;Assistance with cooking/housework;Direct supervision/assist for medications management;Direct supervision/assist for financial management   Equipment Recommendations  None recommended by PT    Recommendations for Other Services       Precautions / Restrictions  Precautions Precautions: Fall Restrictions Weight Bearing Restrictions: No     Mobility  Bed Mobility Overal bed mobility: Needs Assistance Bed Mobility: Supine to Sit, Sit to Supine     Supine to sit: Mod assist, +2 for safety/equipment Sit to supine: Mod assist, +2 for safety/equipment   General bed mobility comments: reports incr pain w mobility, physical asssit for BLE and trunk support Patient Response: Anxious  Transfers Overall transfer level: Needs assistance Equipment used: Rolling walker (2 wheels) Transfers: Sit to/from Stand Sit to Stand: Min assist, +2 safety/equipment                Ambulation/Gait Ambulation/Gait assistance: Min assist Gait Distance (Feet): 8 Feet Assistive device: Rolling walker (2 wheels) Gait Pattern/deviations: Antalgic, Trunk flexed, Step-to pattern, Shuffle Gait velocity: decr     General Gait Details: required cueing for hand placement, forward and retro steps at EOB   Stairs             Wheelchair Mobility    Modified Rankin (Stroke Patients Only)       Balance Overall balance assessment: Needs assistance Sitting-balance support: Feet supported Sitting balance-Leahy Scale: Fair     Standing balance support: Reliant on assistive device for balance, During functional activity, Bilateral upper extremity supported Standing balance-Leahy Scale: Fair                              Cognition Arousal/Alertness: Awake/alert Behavior During Therapy: WFL for tasks assessed/performed Overall Cognitive Status: Difficult to assess                                 General Comments: short term memory deficits  Exercises      General Comments        Pertinent Vitals/Pain Pain Assessment Pain Assessment: PAINAD Breathing: occasional labored breathing, short period of hyperventilation Negative Vocalization: repeated troubled calling out, loud moaning/groaning, crying Facial  Expression: facial grimacing Body Language: tense, distressed pacing, fidgeting Consolability: unable to console, distract or reassure PAINAD Score: 8 Pain Descriptors / Indicators: Crying, Grimacing, Guarding Pain Intervention(s): Limited activity within patient's tolerance, Monitored during session, Repositioned    Home Living                          Prior Function            PT Goals (current goals can now be found in the care plan section) Acute Rehab PT Goals Patient Stated Goal: to get stronger PT Goal Formulation: With patient Time For Goal Achievement: 12/10/21 Potential to Achieve Goals: Fair Progress towards PT goals: PT to reassess next treatment    Frequency    Min 2X/week      PT Plan Current plan remains appropriate    Co-evaluation              AM-PAC PT "6 Clicks" Mobility   Outcome Measure  Help needed turning from your back to your side while in a flat bed without using bedrails?: A Lot Help needed moving from lying on your back to sitting on the side of a flat bed without using bedrails?: A Lot Help needed moving to and from a bed to a chair (including a wheelchair)?: A Lot Help needed standing up from a chair using your arms (e.g., wheelchair or bedside chair)?: A Lot Help needed to walk in hospital room?: A Lot Help needed climbing 3-5 steps with a railing? : Total 6 Click Score: 11    End of Session Equipment Utilized During Treatment: Gait belt;Other (comment) (TLSO)   Patient left: in bed;with call bell/phone within reach;with bed alarm set Nurse Communication: Mobility status PT Visit Diagnosis: Other abnormalities of gait and mobility (R26.89)     Time: 7619-5093 PT Time Calculation (min) (ACUTE ONLY): 23 min  Charges:                        Glenice Laine MPH, SPT 11/27/21, 5:10 PM

## 2021-11-27 NOTE — Progress Notes (Signed)
PROGRESS NOTE    Mark [name]  OZH:[id number] DOB: 02-16-1941 DOA: 11/24/2021 PCP: Donald Prose, MD  123A/123A-AA  LOS: 3 days   Brief hospital course: 81 year old man with history of hypertension hyperlipidemia recent treatment for MSSA bacteremia and completed IV antibiotic course.  History of prostate cancer and chronic low back pain.  Presented to the hospital after a syncopal episode.  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  The patient was also diagnosed with bilateral lower extremity DVT.  Neurology would like to hold off on major anticoagulation for 2 weeks.  IVC filter placed on 11/25/2021.  Assessment & Plan:  * CVA (cerebral vascular accident) ([place]) MRI showing a new interval infarct in left occipital lobe which is likely subacute in chronicity and associated with cortical laminar necrosis and/or petechial hemorrhage.  Neurology cleared to be on aspirin.  LDL 99.  Patient on Crestor.  Case discussed with neurology and they would recommend not giving full anticoagulation for 2 weeks.  Physical therapy recommending rehab  DVT (deep venous thrombosis) (H[facility]) Acute appearing, near occlusive DVT with the left profundus femoral vein protruding into the common femoral veins.  Occlusive DVT visualized in posterior tibial and peroneal veins of the lower extremities bilaterally.  Perfusion scan negative for PE.  IVC filter placed by Dr. Lucky Cowboy on 11/25/2021. --neurology and they would recommend not giving full anticoagulation for 2 weeks  Syncope Could be secondary to stroke and/or hypotension.   Blood pressure stable.  Acute metabolic encephalopathy Likely secondary to stroke.  Mental status improved.  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  Creatinine 1.65 on presentation down to 1.32  with a GFR 55 currently.  History of bacteremia Blood cultures negative for 2 days.  Normocytic anemia Last hemoglobin 8.4.  Continue to monitor closely.    Abnormal chest x-ray 1.4 cm nodular opacity projecting over the anterior distal thoracic spine which could represent a pulmonary nodule versus overlapping osseous structures.  Of note, the patient did have a CT scan of the chest on 09/14/2021 a few months ago that was negative.  Lumbar radiculopathy Patient on gabapentin and oxycodone as needed.    DVT prophylaxis: Lovenox SQ Code Status: Full code  Family Communication:  Level of care: Telemetry Medical Dispo:   The patient is from: home Anticipated d/c is to: SNF Anticipated d/c date is: whenever bed available   Subjective and Interval History:  Pt reported eating well.     Objective: Vitals:   11/27/21 0812 11/27/21 1000 11/27/21 1124 11/27/21 1523  BP: (!) 158/114 (!) 101/51 (!) 103/53 129/66  Pulse: 67  62 61  Resp: 20   16  Temp: 98.4 F (36.9 C)  98.8 F (37.1 C) 98.9 F (37.2 C)  TempSrc:      SpO2: 94%  100% 99%  Weight:      Height:        Intake/Output Summary (Last 24 hours) at 11/27/2021 1739 Last data filed at 11/27/2021 1500 Gross per 24 hour  Intake 690 ml  Output 1075 ml  Net -385 ml   Filed Weights   11/24/21 0932  Weight: 86.2 kg    Examination:   Constitutional: NAD, AAOx3 HEENT: conjunctivae and lids normal, EOMI CV: No cyanosis.   RESP: normal respiratory effort, on RA Extremities: edema in BLE SKIN:  warm, dry Neuro: II - XII grossly intact.     Data Reviewed: I have personally reviewed labs and imaging studies  Time spent: 25 minutes  Enzo Bi, MD [facility] If 7PM-7AM, please contact night-coverage 11/27/2021, 5:39 PM

## 2021-11-28 LAB — BASIC METABOLIC PANEL
Anion gap: 4 — ABNORMAL LOW (ref 5–15)
BUN: 20 mg/dL (ref 8–23)
CO2: 25 mmol/L (ref 22–32)
Calcium: 8.5 mg/dL — ABNORMAL LOW (ref 8.9–10.3)
Chloride: 109 mmol/L (ref 98–111)
Creatinine, Ser: 1.21 mg/dL (ref 0.61–1.24)
GFR, Estimated: >60 mL/min (ref >60)
Glucose, Bld: 86 mg/dL (ref 70–99)
Potassium: 4.3 mmol/L (ref 3.5–5.1)
Sodium: 138 mmol/L (ref 135–145)

## 2021-11-28 LAB — CBC
HCT: 28 % — ABNORMAL LOW (ref 39.0–52.0)
Hemoglobin: 8.7 g/dL — ABNORMAL LOW (ref 13.0–17.0)
MCH: 28.1 pg (ref 26.0–34.0)
MCHC: 31.1 g/dL (ref 30.0–36.0)
MCV: 90.3 fL (ref 80.0–100.0)
Platelets: 244 10*3/uL (ref 150–400)
RBC: 3.1 MIL/uL — ABNORMAL LOW (ref 4.22–5.81)
RDW: 13.6 % (ref 11.5–15.5)
WBC: 6.2 10*3/uL (ref 4.0–10.5)
nRBC: 0 % (ref 0.0–0.2)

## 2021-11-28 LAB — MAGNESIUM: Magnesium: 2.1 mg/dL (ref 1.7–2.4)

## 2021-11-28 MED ORDER — LISINOPRIL 20 MG PO TABS
40.0000 mg | ORAL_TABLET | Freq: Every day | ORAL | Status: DC
  Administered 2021-11-28 – 2021-12-02 (×5): 40 mg via ORAL
  Filled 2021-11-28 (×5): qty 2

## 2021-11-28 MED ORDER — AMLODIPINE BESYLATE 10 MG PO TABS
10.0000 mg | ORAL_TABLET | Freq: Every day | ORAL | Status: DC
  Administered 2021-11-28 – 2021-12-02 (×5): 10 mg via ORAL
  Filled 2021-11-28 (×5): qty 1

## 2021-11-28 NOTE — Progress Notes (Signed)
PROGRESS NOTE    Mark [name]  LOV:[id number] DOB: 1940-11-19 DOA: 11/24/2021 PCP: Donald Prose, MD  123A/123A-AA  LOS: 4 days   Brief hospital course: 81 year old man with history of hypertension hyperlipidemia recent treatment for MSSA bacteremia and completed IV antibiotic course.  History of prostate cancer and chronic low back pain.  Presented to the hospital after a syncopal episode.  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  The patient was also diagnosed with bilateral lower extremity DVT.  Neurology would like to hold off on major anticoagulation for 2 weeks.  IVC filter placed on 11/25/2021.  Assessment & Plan:  * CVA (cerebral vascular accident) ([place]) MRI showing a new interval infarct in left occipital lobe which is likely subacute in chronicity and associated with cortical laminar necrosis and/or petechial hemorrhage.  Neurology cleared to be on aspirin.  LDL 99.  Patient on Crestor.  Case discussed with neurology and they would recommend not giving full anticoagulation for 2 weeks.  Physical therapy recommending rehab  DVT (deep venous thrombosis) (H[facility]) Acute appearing, near occlusive DVT with the left profundus femoral vein protruding into the common femoral veins.  Occlusive DVT visualized in posterior tibial and peroneal veins of the lower extremities bilaterally.  Perfusion scan negative for PE.  IVC filter placed by Dr. Lucky Cowboy on 11/25/2021. --neurology and they would recommend not giving full anticoagulation for 2 weeks  Syncope Could be secondary to stroke and/or hypotension.   Blood pressure stable.  Acute metabolic encephalopathy Likely secondary to stroke.  Mental status improved.  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  Creatinine 1.65 on presentation down to 1.32  with a GFR 55 currently.  Normocytic anemia Last hemoglobin 8.4.  Continue to monitor closely.    Abnormal chest x-ray 1.4 cm nodular opacity projecting over the anterior distal thoracic spine which could represent a pulmonary nodule versus overlapping osseous structures.  Of note, the patient did have a CT scan of the chest on 09/14/2021 a few months ago that was negative.  Lumbar radiculopathy Patient on gabapentin and oxycodone as needed.    DVT prophylaxis: Lovenox SQ Code Status: Full code  Family Communication: wife updated at bedside today Level of care: Telemetry Medical Dispo:   The patient is from: home Anticipated d/c is to: SNF Anticipated d/c date is: whenever bed available   Subjective and Interval History:  No new complaints.  Had BM.   Objective: Vitals:   11/28/21 0441 11/28/21 0800 11/28/21 1223 11/28/21 1548  BP: (!) 161/86 130/71 136/73 132/69  Pulse: 63 (!) 59 61 63  Resp: '16  16 18  '$ Temp: 98.3 F (36.8 C) 98.6 F (37 C) 98 F (36.7 C) 98.5 F (36.9 C)  TempSrc: Oral Oral    SpO2: 100% 100% 100% 100%  Weight:      Height:        Intake/Output Summary (Last 24 hours) at 11/28/2021 1906 Last data filed at 11/28/2021 1100 Gross per 24 hour  Intake 240 ml  Output 1725 ml  Net -1485 ml   Filed Weights   11/24/21 0932  Weight: 86.2 kg    Examination:   Constitutional: NAD, AAOx3 HEENT: conjunctivae and lids normal, EOMI CV: No cyanosis.   RESP: normal respiratory effort, on RA SKIN: warm, dry Neuro: II - XII grossly intact.  Psych: Normal mood and affect.  Appropriate judgement and reason   Data Reviewed: I have personally reviewed labs and imaging studies  Time spent: 25 minutes  Enzo Bi, MD [facility] If 7PM-7AM, please contact night-coverage 11/28/2021, 7:06 PM

## 2021-11-29 LAB — CULTURE, BLOOD (ROUTINE X 2)
Culture: NO GROWTH
Culture: NO GROWTH
Special Requests: ADEQUATE
Special Requests: ADEQUATE

## 2021-11-29 LAB — CBC
HCT: 30.4 % — ABNORMAL LOW (ref 39.0–52.0)
Hemoglobin: 9.2 g/dL — ABNORMAL LOW (ref 13.0–17.0)
MCH: 27.6 pg (ref 26.0–34.0)
MCHC: 30.3 g/dL (ref 30.0–36.0)
MCV: 91.3 fL (ref 80.0–100.0)
Platelets: 260 10*3/uL (ref 150–400)
RBC: 3.33 MIL/uL — ABNORMAL LOW (ref 4.22–5.81)
RDW: 13.3 % (ref 11.5–15.5)
WBC: 7.6 10*3/uL (ref 4.0–10.5)
nRBC: 0 % (ref 0.0–0.2)

## 2021-11-29 LAB — BASIC METABOLIC PANEL
Anion gap: 3 — ABNORMAL LOW (ref 5–15)
BUN: 24 mg/dL — ABNORMAL HIGH (ref 8–23)
CO2: 27 mmol/L (ref 22–32)
Calcium: 8.4 mg/dL — ABNORMAL LOW (ref 8.9–10.3)
Chloride: 110 mmol/L (ref 98–111)
Creatinine, Ser: 1.32 mg/dL — ABNORMAL HIGH (ref 0.61–1.24)
GFR, Estimated: 55 mL/min — ABNORMAL LOW (ref >60)
Glucose, Bld: 91 mg/dL (ref 70–99)
Potassium: 4.4 mmol/L (ref 3.5–5.1)
Sodium: 140 mmol/L (ref 135–145)

## 2021-11-29 LAB — MAGNESIUM: Magnesium: 2.1 mg/dL (ref 1.7–2.4)

## 2021-11-29 NOTE — Progress Notes (Signed)
[facility] [facility] [place] NAME: Mark [name]    MR#:  [id number]  DATE OF BIRTH:  January 08, 1941  SUBJECTIVE:  patient complains of left hip pain/stiffness earlier in the day. Now resolved. No family at bedside    VITALS:  Blood pressure (!) 152/75, pulse (!) 57, temperature 98.5 F (36.9 C), temperature source Oral, resp. rate 17, height '5\' 6"'$  (1.676 m), weight 86.2 kg, SpO2 100 %.  PHYSICAL EXAMINATION:   GENERAL:  81 y.o.-year-old patient lying in the bed with no acute distress.  LUNGS: Normal breath sounds bilaterally, no wheezing, rales, rhonchi.  CARDIOVASCULAR: S1, S2 normal. No murmurs, rubs, or gallops.  ABDOMEN: Soft, nontender, nondistended. Bowel sounds present.  EXTREMITIES: No  edema b/l.    NEUROLOGIC: nonfocal  patient is alert and awake SKIN: No obvious rash, lesion, or ulcer.   LABORATORY PANEL:  CBC Recent Labs  Lab 11/29/21 0421  WBC 7.6  HGB 9.2*  HCT 30.4*  PLT 260    Chemistries  Recent Labs  Lab 11/29/21 0421  NA 140  K 4.4  CL 110  CO2 27  GLUCOSE 91  BUN 24*  CREATININE 1.32*  CALCIUM 8.4*  MG 2.1    Assessment and Plan 81 year old man with history of hypertension hyperlipidemia recent treatment for MSSA bacteremia and completed IV antibiotic course.  History of prostate cancer and chronic low back pain.  Presented to the hospital after a syncopal episode.  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  The patient was also diagnosed with bilateral lower extremity DVT.  Neurology would like to hold off on major anticoagulation for 2 weeks.  IVC filter placed on 11/25/2021.  CVA (cerebral vascular accident) ([place]) 1--MRI showing a new interval infarct in left occipital lobe which is likely subacute in chronicity and associated with cortical laminar necrosis and/or petechial hemorrhage.    --Neurology cleared to be on aspirin.  LDL 99.  -- Patient on Crestor.  Case discussed with neurology and they would recommend not giving full anticoagulation for 2 weeks.   --Physical therapy recommending rehab   DVT (deep venous thrombosis) (HCC) --Acute appearing, near occlusive DVT with the left profundus femoral vein protruding into the common femoral veins.  Occlusive DVT visualized in posterior tibial and peroneal veins of the lower extremities bilaterally. -- Perfusion scan negative for PE.  -- IVC filter placed by Dr. Lucky Cowboy on 11/25/2021. --neurology and they would recommend not giving full anticoagulation for 2 weeks till 12/08/21   Syncope Could be secondary to stroke and/or hypotension.   Blood pressure stable.   Acute metabolic encephalopathy Likely secondary to stroke.  Mental status improved.   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  Creatinine 1.65 on presentation down to 1.32 with a GFR 55 currently.   Normocytic anemia Last hemoglobin 8.4.  Continue to monitor closely.     Abnormal chest x-ray 1.4 cm nodular opacity projecting over the anterior distal thoracic spine which could represent a pulmonary nodule versus overlapping osseous structures.  Of note, the patient did have a CT scan of the chest on 09/14/2021 a few months ago that was negative.   Lumbar radiculopathy Patient on gabapentin and oxycodone as needed.       DVT prophylaxis: Lovenox SQ Code Status: Full code  Family  Communication: none today Level of care: Telemetry Medical Dispo:   The patient is from: home Anticipated d/c is to: SNF Anticipated d/c date is: whenever bed available. Pt is medically best at baseline for d/c    TOTAL TIME TAKING CARE OF THIS PATIENT: 35 minutes.  >50% time spent on counselling and coordination of care  Note: This dictation was prepared with Dragon dictation along with smaller phrase  technology. Any transcriptional errors that result from this process are unintentional.  Fritzi Mandes M.D    [facility]   CC: Primary care physician; Donald Prose, MD

## 2021-11-29 NOTE — Progress Notes (Addendum)
Patient c/o of a lot of pain to right posterior hip with minimal movement in bed. Unable to assess for drift to bilateral LE d/t patient c/o right hip pain and "joint pain" to left LE. Patient alert to self.  Incontinence care provided. Patient declined pain medication at this time. He is resting. Dr. Posey Pronto made aware and will see the patient.   Around 1100 patient denies pain to right hip or bilateral LE and able to move in bed independently. Stated stiffness to right hip and bilateral LE comes and go. It happens usually at night and in the morning.

## 2021-11-30 ENCOUNTER — Other Ambulatory Visit (HOSPITAL_COMMUNITY): Payer: Self-pay

## 2021-11-30 ENCOUNTER — Telehealth (HOSPITAL_COMMUNITY): Payer: Self-pay | Admitting: Pharmacy Technician

## 2021-11-30 MED ORDER — DOCUSATE SODIUM 100 MG PO CAPS
100.0000 mg | ORAL_CAPSULE | Freq: Two times a day (BID) | ORAL | Status: DC
  Administered 2021-11-30 – 2021-12-02 (×4): 100 mg via ORAL
  Filled 2021-11-30 (×4): qty 1

## 2021-11-30 MED ORDER — POLYETHYLENE GLYCOL 3350 17 G PO PACK
17.0000 g | PACK | Freq: Two times a day (BID) | ORAL | Status: DC
  Administered 2021-11-30 – 2021-12-02 (×4): 17 g via ORAL
  Filled 2021-11-30 (×4): qty 1

## 2021-11-30 NOTE — Progress Notes (Signed)
[facility] [place] NAME: Mark [name]    MR#:  [id number]  DATE OF BIRTH:  [birth year]  SUBJECTIVE:  wife at bedside. Patient eating lunch earlier. No other issues.   VITALS:  Blood pressure 139/77, pulse 60, temperature 98 F (36.7 C), resp. rate 16, height '5\' 6"'$  (1.676 m), weight 86.2 kg, SpO2 100 %.  PHYSICAL EXAMINATION:   GENERAL:  81 y.o.-year-old patient lying in the bed with no acute distress.  LUNGS: Normal breath sounds bilaterally, no wheezing, rales, rhonchi.  CARDIOVASCULAR: S1, S2 normal. No murmurs, rubs, or gallops.  ABDOMEN: Soft, nontender, nondistended. Bowel sounds present.  EXTREMITIES: No  edema b/l.    NEUROLOGIC: nonfocal  patient is alert and awake SKIN: No obvious rash, lesion, or ulcer.   LABORATORY PANEL:  CBC Recent Labs  Lab 11/29/21 0421  WBC 7.6  HGB 9.2*  HCT 30.4*  PLT 260     Chemistries  Recent Labs  Lab 11/29/21 0421  NA 140  K 4.4  CL 110  CO2 27  GLUCOSE 91  BUN 24*  CREATININE 1.32*  CALCIUM 8.4*  MG 2.1     Assessment and Plan 81 year old man with history of hypertension hyperlipidemia recent treatment for MSSA bacteremia and completed IV antibiotic course.  History of prostate cancer and chronic low back pain.  Presented to the hospital after a syncopal episode.  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  The patient was also diagnosed with bilateral lower extremity DVT.  Neurology would like to hold off on major anticoagulation for 2 weeks.  IVC filter placed on 11/25/2021.  CVA (cerebral vascular accident) ([place]) 1--MRI showing a new interval infarct in left occipital lobe which is likely subacute in chronicity and associated with cortical laminar necrosis and/or petechial hemorrhage.   --Neurology cleared to be on aspirin.  LDL 99.  -- Patient on  Crestor.  Case discussed with neurology and they would recommend not giving full anticoagulation for 2 weeks.   --Physical therapy recommending rehab   DVT (deep venous thrombosis) (HCC) --Acute appearing, near occlusive DVT with the left profundus femoral vein protruding into the common femoral veins.  Occlusive DVT visualized in posterior tibial and peroneal veins of the lower extremities bilaterally. -- Perfusion scan negative for PE.  -- IVC filter placed by Dr. Lucky Cowboy on 11/25/2021. --neurology and they would recommend not giving full anticoagulation for 2 weeks till 12/08/21   Syncope Could be secondary to stroke and/or hypotension.   Blood pressure stable.   Acute metabolic encephalopathy Likely secondary to stroke.  Mental status improved.   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  Creatinine 1.65 on presentation down to 1.32 with a GFR 55 currently.   Normocytic anemia Last hemoglobin 8.4.  Continue to monitor closely.     Abnormal chest x-ray 1.4 cm nodular opacity projecting over the anterior distal thoracic spine which could represent a pulmonary nodule versus overlapping osseous structures.  Of note, the patient did have a CT scan of the chest on 09/14/2021 a few months ago that was negative.   Lumbar radiculopathy Patient on gabapentin and oxycodone as needed.       DVT prophylaxis: Lovenox SQ Code Status: Full code  Family Communication: none today Level of care: Telemetry Medical Dispo:  The patient is from: home Anticipated d/c is to: SNF Anticipated d/c date is: whenever bed available. Pt is medically best at baseline for d/c per TOC patient's bed search has been expanded    TOTAL TIME TAKING CARE OF THIS PATIENT: 35 minutes.  >50% time spent on counselling and coordination of care  Note: This dictation was prepared with Dragon dictation along with smaller phrase technology. Any  transcriptional errors that result from this process are unintentional.  Fritzi Mandes M.D    [facility]   CC: Primary care physician; Donald Prose, MD

## 2021-11-30 NOTE — Progress Notes (Addendum)
Speech Language Pathology Treatment:    Patient Details Name: Mark [name] MRN: [id number] DOB: [birth year] Today's Date: 11/30/2021 Time: 0321-2248 SLP Time Calculation (min) (ACUTE ONLY): 18 min  Assessment / Plan / Recommendation Clinical Impression  Pt seen for cognitive-linguistic tx. Pt alert, pleasant, and cooperative. Sitting upright in bed watching TV upon SLP entrance to room.  Pt participated in skilled SLP services targeting functional auditory comprehension and verbal expression. Pt answered complex yes/no questions with 75% accuracy initially, 83% accuracy with self-correction and extra time. Accuracy did not improve with repetition of stimuli. Pt participated in paragraph level auditory comprehension task. Pt answered 6/6 yes/no questions following short paragraph with extra time. During informal conversational exchanges, x1 instance of anomia noted for low frequency word (name of specific golf course). Pt unable to repair communication breakdown despite cues for circumlocution/description. Overall, pt's speech was largely fluent and appropriate. No s/sx dysarthria appreciated.   Pt continues to make improvements in functional cognitive-communication.  Based on today's tx, anticipate need for continued SLP services at d/c. SLP to continue to follow acutely.    HPI HPI: Pt  is a 81 y.o. 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  History is obtained mostly from his wife over the phone as the patient reports that he is here due to right-sided back pain and the year is 25.  This morning around 2 AM the patient had said he needed to go to the restroom, but was unable to get up.  He had been sleeping in his a lift chair as his back  have been giving him trouble for quite some time.  His wife had given him a urinal to use and he had gone back to sleep.  He woke up 2 hours later and was stating that he had the get up.  She attempted to help him up, but was unable.  A family friend was able to get him up to the wheelchair later on that morning.  His wife states that the was eating breakfast and the next thing she noticed was his head was laid back and his eyes were rolled in the back of his head. She went to check on him and he did not respond at first.  He did not respond for 30 seconds or so, and then was just staring at her and seem like he was having trouble breathing.  She called EMS at that time.  He had last been hospitalized from 6/5-6/13 at [facility] where he was noted to have multifocal embolic infarcts related to MSSA bacteremia with a mitral valve vegetation.  A PICC line was placed and he was treated with nafcillin.  His wife notes that he completed the antibiotics on 7/26.  Following the stroke patient had issues with his vision, but his wife reported no other deficits.   MRI: In comparison to MRI from Sep 14, 2021, new/interval infarct in  the left occipital lobe (left PCA territory), which is likely  subacute in chronicity and described above. Associated cortical  laminar necrosis and/or petechial hemorrhage.  2. Moderate chronic microvascular ischemic disease.      SLP Plan  Continue with current plan of care      Recommendations for follow up therapy are one component of a multi-disciplinary discharge planning process,  led by the attending physician.  Recommendations may be updated based on patient status, additional functional criteria and insurance authorization.    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

## 2021-11-30 NOTE — TOC Progression Note (Signed)
Transition of Care [facility]) - Progression Note    Patient Details  Name: Mark [name] MRN: [id number] Date of Birth: 04-09-1941  Transition of [facility]) CM/SW Contact  Laurena Slimmer, RN Phone Number: 11/30/2021, 12:13 PM  Clinical Narrative:    Spoke with patient and spouse at bedside to advise no bed offers had been given primarily due to payor not being in network. Advised bed offer was extended.         Expected Discharge Plan and Services                                                 Social Determinants of Health (SDOH) Interventions    Readmission Risk Interventions     No data to display

## 2021-11-30 NOTE — Progress Notes (Addendum)
No bed offers. Bed search extended.

## 2021-11-30 NOTE — Progress Notes (Signed)
Occupational Therapy Treatment Patient Details Name: Mark [name] MRN: [id number] DOB: 11/10/1940 Today's Date: 11/30/2021   History of present illness Pt is an 81 yo male that presented to the ED for syncope, AMS. 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 PMH of hypertension, hyperlipidemia, CAD, CKD, prostate cancer s/p radiation.   OT comments  Upon entering session, pt sitting at EOB. Pt agreeable to OT session. Pt completed functional mobility to the sink with a RW to engage in grooming tasks. Pt voicing feeling fatigued afterwards, requesting to return to EOB and lay back down. Pt required VC to locate self-care items and for accuracy with placing toothpaste on toothbrush 2/2 low vision. Pt required physical assistance this date to complete functional transfers, functional mobility, and maintain standing balance with activity. Pt is making progress toward goal completion. D/C recommendation remains appropriate. OT will continue to follow acutely.   Recommendations for follow up therapy are one component of a multi-disciplinary discharge planning process, led by the attending physician.  Recommendations may be updated based on patient status, additional functional criteria and insurance authorization.    Follow Up Recommendations  Skilled nursing-short term rehab (<3 hours/day)    Assistance Recommended at Discharge Frequent or constant Supervision/Assistance  Patient can return home with the following  A lot of help with bathing/dressing/bathroom;Assistance with cooking/housework;Assistance with feeding;Direct supervision/assist for financial management;Direct supervision/assist for medications management;Assist for transportation;A lot of help with walking and/or transfers   Equipment  Recommendations  Other (comment) (defer to next venue of care)    Recommendations for Other Services      Precautions / Restrictions Precautions Precautions: Fall Restrictions Weight Bearing Restrictions: No       Mobility Bed Mobility Overal bed mobility: Needs Assistance Bed Mobility: Sit to Supine       Sit to supine: Supervision   General bed mobility comments: received sitting EOB, supervision and VC for sit to supine    Transfers Overall transfer level: Needs assistance Equipment used: Rolling walker (2 wheels) Transfers: Sit to/from Stand Sit to Stand: Min guard                 Balance Overall balance assessment: Needs assistance Sitting-balance support: Feet supported Sitting balance-Leahy Scale: Good Sitting balance - Comments: good static sitting balance   Standing balance support: Reliant on assistive device for balance, During functional activity, Bilateral upper extremity supported Standing balance-Leahy Scale: Fair Standing balance comment: no LOB upon standing, Min guard for static standing balance at sink                           ADL either performed or assessed with clinical judgement   ADL Overall ADL's : Needs assistance/impaired     Grooming: Wash/dry face;Oral care;Set up;Standing;Min guard Grooming Details (indicate cue type and reason): Pt completed grooming while standing at sink with set up A and Min guard for safety                             Functional mobility during ADLs: Rolling walker (2 wheels);Min guard;Cueing for safety General ADL Comments: Pt completed functional mobility to the sink with Min guard + RW    Extremity/Trunk Assessment Upper Extremity Assessment Upper Extremity Assessment: RUE deficits/detail;LUE deficits/detail RUE  Deficits / Details: grip strength fair, UE AROM WFL for shoulder flexion, elbow fex/ext, wrist flex/ext LUE Deficits / Details: L residual weakness from past CVA,  grip strength fair, UE AROM WFL for shoulder flexion, elbow fex/ext, wrist flex/ext   Lower Extremity Assessment Lower Extremity Assessment: RLE deficits/detail;LLE deficits/detail RLE Deficits / Details: R knee 4/5, hip 3-/5, ankle 4/5 RLE Sensation: WNL RLE Coordination: WNL LLE Deficits / Details: L knee 3/5, hip 2+/5, ankle 4/5 LLE Sensation: WNL LLE Coordination: WNL   Cervical / Trunk Assessment Cervical / Trunk Assessment: Normal    Vision Patient Visual Report: Diplopia Additional Comments: Per physician neurology note on 11/25/21, "he has a dense right hemianopia"              Cognition Arousal/Alertness: Awake/alert Behavior During Therapy: WFL for tasks assessed/performed Overall Cognitive Status: Difficult to assess                                 General Comments: short term memory deficits                           Pertinent Vitals/ Pain       Pain Assessment Pain Assessment: No/denies pain  Home Living                                          Prior Functioning/Environment              Frequency  Min 2X/week        Progress Toward Goals  OT Goals(current goals can now be found in the care plan section)  Progress towards OT goals: Progressing toward goals  Acute Rehab OT Goals Patient Stated Goal: get stronger OT Goal Formulation: With patient/family Time For Goal Achievement: 12/10/21 Potential to Achieve Goals: [place] Discharge plan remains appropriate;Frequency remains appropriate    Co-evaluation                 AM-PAC OT "6 Clicks" Daily Activity     Outcome Measure   Help from another person eating meals?: A Little Help from another person taking care of personal grooming?: A Little Help from another person toileting, which includes using toliet, bedpan, or urinal?: A Lot Help from another person bathing (including washing, rinsing, drying)?: A Lot Help from another person to  put on and taking off regular upper body clothing?: A Little Help from another person to put on and taking off regular lower body clothing?: A Little 6 Click Score: 16    End of Session Equipment Utilized During Treatment: Gait belt;Rolling walker (2 wheels)  OT Visit Diagnosis: Unsteadiness on feet (R26.81);Pain;Muscle weakness (generalized) (M62.81);Cognitive communication deficit (R41.841) Symptoms and signs involving cognitive functions:  (left occipital infarct)   Activity Tolerance Patient tolerated treatment well;Patient limited by fatigue   Patient Left in bed;with call bell/phone within reach;with bed alarm set   Nurse Communication Mobility status        Time: 6314-9702 OT Time Calculation (min): 18 min  Charges: OT General Charges $OT Visit: 1 Visit OT Treatments $Self Care/Home Management : 8-22 mins  [facility] MS, OTR/L ascom [phone]  11/30/21, 4:58 PM

## 2021-11-30 NOTE — Progress Notes (Signed)
Physical Therapy Treatment Patient Details Name: Mark [name] MRN: [id number] DOB: 05/31/1940 Today's Date: 11/30/2021   History of Present Illness Pt is an 81 yo male that presented to the ED for syncope, AMS. 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 PMH of hypertension, hyperlipidemia, CAD, CKD, prostate cancer s/p radiation.    PT Comments    Pt presents to PT in bed and agreeable to participate in therapy services. Pt is motivated and put forth good effort throughout session. Pt's mobility significantly improved from 11/27/21 session. Pt's cognitive state also appears to be improved as well, but still displays some short term memory deficits. Pt able to amb 15 ft w CGA and RW, pt limited by need for BM. Pt would benefit from skilled PT at SNF to address above deficits in functional mobility to promote optimal return to PLOF.    Recommendations for follow up therapy are one component of a multi-disciplinary discharge planning process, led by the attending physician.  Recommendations may be updated based on patient status, additional functional criteria and insurance authorization.  Follow Up Recommendations  Skilled nursing-short term rehab (<3 hours/day) Can patient physically be transported by private vehicle: Yes   Assistance Recommended at Discharge Frequent or constant Supervision/Assistance  Patient can return home with the following Assist for transportation;Assistance with cooking/housework;Direct supervision/assist for medications management;Direct supervision/assist for financial management;A little help with walking and/or transfers;A little help with bathing/dressing/bathroom   Equipment Recommendations  None recommended by PT    Recommendations for Other Services        Precautions / Restrictions Precautions Precautions: Fall Restrictions Weight Bearing Restrictions: No     Mobility  Bed Mobility Overal bed mobility: Needs Assistance Bed Mobility: Supine to Sit, Sit to Supine     Supine to sit: Modified independent (Device/Increase time)     General bed mobility comments: Pt denies pain during transfers today    Transfers Overall transfer level: Needs assistance Equipment used: Rolling walker (2 wheels) Transfers: Sit to/from Stand Sit to Stand: Min guard Stand pivot transfers: Min guard, +2 safety/equipment              Ambulation/Gait Ambulation/Gait assistance: Min guard Gait Distance (Feet): 15 Feet Assistive device: Rolling walker (2 wheels) Gait Pattern/deviations: Trunk flexed, Step-to pattern, Shuffle Gait velocity: decr         Stairs             Wheelchair Mobility    Modified Rankin (Stroke Patients Only)       Balance Overall balance assessment: Needs assistance Sitting-balance support: Feet supported Sitting balance-Leahy Scale: Fair     Standing balance support: Reliant on assistive device for balance, During functional activity, Bilateral upper extremity supported Standing balance-Leahy Scale: Fair                              Cognition Arousal/Alertness: Awake/alert Behavior During Therapy: WFL for tasks assessed/performed Overall Cognitive Status: Difficult to assess                                 General Comments: short term memory deficits        Exercises Total Joint Exercises Ankle Circles/Pumps: AROM, Both, 15 reps Long Arc Quad: Strengthening,  Both, 15 reps Marching in Standing: Strengthening, Both, 10 reps, Seated    General Comments        Pertinent Vitals/Pain Pain Assessment Pain Assessment: No/denies pain Pain Score: 0-No pain Pain Intervention(s): Monitored during session    Home Living                          Prior  Function            PT Goals (current goals can now be found in the care plan section) Acute Rehab PT Goals Patient Stated Goal: to get stronger PT Goal Formulation: With patient Time For Goal Achievement: 12/10/21 Potential to Achieve Goals: Fair Progress towards PT goals: Progressing toward goals    Frequency    Min 2X/week      PT Plan Current plan remains appropriate    Co-evaluation              AM-PAC PT "6 Clicks" Mobility   Outcome Measure  Help needed turning from your back to your side while in a flat bed without using bedrails?: A Little Help needed moving from lying on your back to sitting on the side of a flat bed without using bedrails?: A Little Help needed moving to and from a bed to a chair (including a wheelchair)?: A Little Help needed standing up from a chair using your arms (e.g., wheelchair or bedside chair)?: A Little Help needed to walk in hospital room?: A Little Help needed climbing 3-5 steps with a railing? : A Lot 6 Click Score: 17    End of Session Equipment Utilized During Treatment: Gait belt Activity Tolerance: Patient tolerated treatment well;No increased pain Patient left: Other (comment);with nursing/sitter in room (on [facility] w nurse present in room) Nurse Communication: Mobility status PT Visit Diagnosis: Other abnormalities of gait and mobility (R26.89)     Time: 8850-2774 PT Time Calculation (min) (ACUTE ONLY): 20 min  Charges:                        Glenice Laine MPH, SPT 11/30/21, 4:50 PM

## 2021-11-30 NOTE — Telephone Encounter (Signed)
Pharmacy Patient Advocate Encounter  Insurance verification completed.    The patient is insured through [employer] Part [employer]   The patient is currently admitted and ran test claims for the following: ezetimibe (Zetia) 10 mg tablets.  Copays and coinsurance results were relayed to Inpatient clinical team.

## 2021-11-30 NOTE — TOC Benefit Eligibility Note (Signed)
Patient Teacher, English as a foreign language completed.    The patient is currently admitted and upon discharge could be taking ezetimibe (Zetia) 10 mg tablets.  The current 30 day co-pay is $7.00.   The patient is insured through [place], [place] Patient Advocate Specialist [place] Patient Advocate Team Direct Number: [phone]  Fax: [phone]

## 2021-12-01 NOTE — Progress Notes (Signed)
Physical Therapy Treatment Patient Details Name: Mark [name] MRN: [id number] DOB: 01/20/1941 Today's Date: 12/01/2021   History of Present Illness Pt is an 81 yo male that presented to the ED for syncope, AMS. 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 PMH of hypertension, hyperlipidemia, CAD, CKD, prostate cancer s/p radiation.    PT Comments    Pt presents to PT in bed and agreeable to participate in therapy services. Pt is motivated and put forth good effort throughout session. Able to amb in hallway w CGA, RW, and no LOB. Therapeutic exercises performed in recliner to promote strengthening. Pt limited by having to have BM, nursing notified, and spouse present in room. Pt would benefit from skilled PT at SNF to address above deficits and promote optimal return to PLOF. Current plan remains appropriate.     Recommendations for follow up therapy are one component of a multi-disciplinary discharge planning process, led by the attending physician.  Recommendations may be updated based on patient status, additional functional criteria and insurance authorization.  Follow Up Recommendations  Skilled nursing-short term rehab (<3 hours/day) Can patient physically be transported by private vehicle: Yes   Assistance Recommended at Discharge Frequent or constant Supervision/Assistance  Patient can return home with the following Assist for transportation;Assistance with cooking/housework;Direct supervision/assist for medications management;Direct supervision/assist for financial management;A little help with walking and/or transfers;A little help with bathing/dressing/bathroom   Equipment Recommendations  None recommended by PT    Recommendations for Other Services       Precautions / Restrictions  Precautions Precautions: Fall Restrictions Weight Bearing Restrictions: No     Mobility  Bed Mobility Overal bed mobility: Needs Assistance Bed Mobility: Sit to Supine     Supine to sit: Min assist     General bed mobility comments: needed some cueing for sequencing for sup<sit, and HH assist to get to EOB Patient Response: Cooperative  Transfers Overall transfer level: Needs assistance Equipment used: Rolling walker (2 wheels) Transfers: Sit to/from Stand Sit to Stand: Min guard Stand pivot transfers: Min guard              Ambulation/Gait Ambulation/Gait assistance: Min guard Gait Distance (Feet): 80 Feet Assistive device: Rolling walker (2 wheels) Gait Pattern/deviations: Trunk flexed, Step-to pattern, Shuffle Gait velocity: decr     General Gait Details: required cueing for hand placement, visual deficits on R side   Stairs             Wheelchair Mobility    Modified Rankin (Stroke Patients Only)       Balance Overall balance assessment: Needs assistance Sitting-balance support: Feet supported Sitting balance-Leahy Scale: Good     Standing balance support: Reliant on assistive device for balance, During functional activity, Bilateral upper extremity supported Standing balance-Leahy Scale: Fair                              Cognition Arousal/Alertness: Awake/alert Behavior During Therapy: WFL for tasks assessed/performed Overall Cognitive Status: Difficult to assess                                 General Comments: short term memory deficits        Exercises Total Joint Exercises Ankle Circles/Pumps: AROM,  Both, 15 reps Long Arc Quad: Strengthening, Both, 15 reps Other Exercises Other Exercises: sit to stand x5    General Comments        Pertinent Vitals/Pain Pain Assessment Pain Assessment: 0-10 Pain Score: 5  Pain Descriptors / Indicators: Aching, Discomfort Pain Intervention(s): Monitored  during session    Home Living                          Prior Function            PT Goals (current goals can now be found in the care plan section) Acute Rehab PT Goals Patient Stated Goal: to get stronger PT Goal Formulation: With patient Time For Goal Achievement: 12/10/21 Potential to Achieve Goals: Fair Progress towards PT goals: Progressing toward goals    Frequency    Min 2X/week      PT Plan Current plan remains appropriate    Co-evaluation              AM-PAC PT "6 Clicks" Mobility   Outcome Measure  Help needed turning from your back to your side while in a flat bed without using bedrails?: A Little Help needed moving from lying on your back to sitting on the side of a flat bed without using bedrails?: A Little Help needed moving to and from a bed to a chair (including a wheelchair)?: A Little Help needed standing up from a chair using your arms (e.g., wheelchair or bedside chair)?: A Little Help needed to walk in hospital room?: A Little Help needed climbing 3-5 steps with a railing? : A Lot 6 Click Score: 17    End of Session Equipment Utilized During Treatment: Gait belt Activity Tolerance: Patient tolerated treatment well;No increased pain Patient left: Other (comment) (left on [facility] w wife present in room, instructed to call nursing when ready to return to bed) Nurse Communication: Mobility status PT Visit Diagnosis: Other abnormalities of gait and mobility (R26.89)     Time: 8315-1761 PT Time Calculation (min) (ACUTE ONLY): 23 min  Charges:                       Glenice Laine MPH, SPT 12/01/21, 11:25 AM

## 2021-12-01 NOTE — TOC Progression Note (Signed)
Transition of Care [facility]) - Progression Note    Patient Details  Name: RODDY BELLAMY MRN: [id number] Date of Birth: [birth year]  Transition of Care [facility]) CM/SW Contact  Laurena Slimmer, RN Phone Number: 12/01/2021, 2:49 PM  Clinical Narrative:    Spoke with patient's wife regarding discharge plan. Wife advised no bed offers had been made due to payor source. Advised [facility] accepts patient's insurance. Patient wife agreeable to [facility]. Clinical sent in [place]. Admission Director, Levi Aland notified of incoming clinical for review.         Expected Discharge Plan and Services                                                 Social Determinants of Health (SDOH) Interventions    Readmission Risk Interventions     No data to display

## 2021-12-01 NOTE — Progress Notes (Signed)
[facility] [place] NAME: Horald Birky    MR#:  [id number]  DATE OF BIRTH:  [birth year]  SUBJECTIVE:  wife at bedside. No new complaints Awaiting d/c to rehab Wife tells me she is ok for pt to return to [facility]   VITALS:  Blood pressure 138/85, pulse (!) 57, temperature 98.2 F (36.8 C), resp. rate 17, height '5\' 6"'$  (1.676 m), weight 86.2 kg, SpO2 98 %.  PHYSICAL EXAMINATION:   GENERAL:  81 y.o.-year-old patient lying in the bed with no acute distress.  LUNGS: Normal breath sounds bilaterally, no wheezing, rales, rhonchi.  CARDIOVASCULAR: S1, S2 normal. No murmurs, rubs, or gallops.  ABDOMEN: Soft, nontender, nondistended. Bowel sounds present.  EXTREMITIES: No  edema b/l.    NEUROLOGIC: nonfocal  patient is alert and awake SKIN: No obvious rash, lesion, or ulcer.   LABORATORY PANEL:  CBC Recent Labs  Lab 11/29/21 0421  WBC 7.6  HGB 9.2*  HCT 30.4*  PLT 260     Chemistries  Recent Labs  Lab 11/29/21 0421  NA 140  K 4.4  CL 110  CO2 27  GLUCOSE 91  BUN 24*  CREATININE 1.32*  CALCIUM 8.4*  MG 2.1     Assessment and Plan 81 year old man with history of hypertension hyperlipidemia recent treatment for MSSA bacteremia and completed IV antibiotic course.  History of prostate cancer and chronic low back pain.  Presented to the hospital after a syncopal episode.  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  The patient was also diagnosed with bilateral lower extremity DVT.  Neurology would like to hold off on major anticoagulation for 2 weeks.  IVC filter placed on 11/25/2021.  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   --Neurology  cleared to be on aspirin.  LDL 99.  -- Patient on Crestor.  Case discussed with neurology and they would recommend not giving full anticoagulation for 2 weeks.   --Physical therapy recommending rehab   DVT (deep venous thrombosis) (HCC) --Acute appearing, near occlusive DVT with the left profundus femoral vein protruding into the common femoral veins.  Occlusive DVT visualized in posterior tibial and peroneal veins of the lower extremities bilaterally. -- Perfusion scan negative for PE.  -- IVC filter placed by Dr. Lucky Cowboy on 11/25/2021. --neurology and they would recommend not giving full anticoagulation for 2 weeks till 12/08/21   Syncope Could be secondary to stroke and/or hypotension.   Blood pressure stable. --resolved   Acute metabolic encephalopathy Likely secondary to stroke.  Mental status improved.   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  Creatinine 1.65 on presentation down to 1.32 with a GFR 55 currently.   Normocytic anemia Last hemoglobin 8.4.  Continue to monitor closely.     Abnormal chest x-ray 1.4 cm nodular opacity projecting over the anterior distal thoracic spine which could represent a pulmonary nodule versus overlapping osseous structures.  Of note, the patient did have a CT scan of the chest on 09/14/2021 a few months ago that was negative.   Lumbar radiculopathy Patient on gabapentin and oxycodone as needed.   Spoke with wife at bedside. Due to Out of network  issues with rehab placement. Wife and pt now agreeable to go to [facility] for [facility]   DVT prophylaxis: Lovenox SQ Code Status: Full code  Family Communication: wife at bedside Level of care: Telemetry Medical Dispo:   The patient is from: home Anticipated d/c is to: SNF Anticipated d/c date is: whenever bed available. Pt is medically best at baseline for d/c per TOC patient's bed search has been expanded    TOTAL TIME TAKING  CARE OF THIS PATIENT: 35 minutes.  >50% time spent on counselling and coordination of care  Note: This dictation was prepared with Dragon dictation along with smaller phrase technology. Any transcriptional errors that result from this process are unintentional.  Fritzi Mandes M.D    [facility]   CC: Primary care physician; Donald Prose, MD

## 2021-12-02 DIAGNOSIS — N179 Acute kidney failure, unspecified: Secondary | ICD-10-CM | POA: Diagnosis not present

## 2021-12-02 DIAGNOSIS — D649 Anemia, unspecified: Secondary | ICD-10-CM | POA: Diagnosis not present

## 2021-12-02 DIAGNOSIS — I82433 Acute embolism and thrombosis of popliteal vein, bilateral: Secondary | ICD-10-CM | POA: Diagnosis not present

## 2021-12-02 DIAGNOSIS — I639 Cerebral infarction, unspecified: Secondary | ICD-10-CM | POA: Diagnosis not present

## 2021-12-02 MED ORDER — ASPIRIN 81 MG PO TBEC: 81.0000 mg | DELAYED_RELEASE_TABLET | Freq: Every day | ORAL | 0 refills | Status: DC

## 2021-12-02 MED ORDER — ACETAMINOPHEN 325 MG PO TABS: 650.0000 mg | ORAL_TABLET | ORAL | Status: AC | PRN

## 2021-12-02 MED ORDER — ROSUVASTATIN CALCIUM 40 MG PO TABS: 40.0000 mg | ORAL_TABLET | Freq: Every day | ORAL | 0 refills | Status: DC

## 2021-12-02 MED ORDER — POLYETHYLENE GLYCOL 3350 17 G PO PACK: 17.0000 g | PACK | Freq: Every day | ORAL | 0 refills | Status: DC

## 2021-12-02 MED ORDER — APIXABAN 5 MG PO TABS: ORAL_TABLET | ORAL | 0 refills | Status: DC

## 2021-12-02 NOTE — TOC Transition Note (Signed)
Transition of Care [facility]) - CM/SW Discharge Note   Patient Details  Name: JOHARI PINNEY MRN: [id number] Date of Birth: 1940-07-18  Transition of Care [facility]) CM/SW Contact:  Alberteen Sam, LCSW Phone Number: 12/02/2021, 12:50 PM   Clinical Narrative:     CSW spoke with patient and wife at bedside, per [facility] they owe around $1400 if they pay he can come to snf. They also report he's in copay days so whichever facility he goes to will be around $200/day. Per wife they are unable to afford and will go home with [facility]. Requested to be set up with [facility] spoke with Judson Roch with [facility] who is able to continue to see patient for [facility] services.   NO DME needs identified, patient has walker, 3in1 and wheel chair at home.    Final next level of care: [place] Barriers to Discharge: No Barriers Identified   Patient Goals and CMS Choice Patient states their goals for this hospitalization and ongoing recovery are:: to go home CMS Medicare.gov Compare Post Acute Care list provided to:: Patient Choice offered to / list presented to : Patient  Discharge Placement                    Patient and family notified of of transfer: 12/02/21  Discharge Plan and Services                                     Social Determinants of Health (SDOH) Interventions     Readmission Risk Interventions     No data to display

## 2021-12-02 NOTE — Discharge Summary (Signed)
Physician Discharge Summary   Patient: Mark [name] MRN: [id number] DOB: 12/26/1940  Admit date:     11/24/2021  Discharge date: 12/02/21  Discharge Physician: Loletha Grayer   PCP: Dr. Noralee Chars  Recommendations at discharge:   Follow-up PCP 5 days Follow-up neurology Follow-up vascular surgery  Discharge Diagnoses: Principal Problem:   CVA (cerebral vascular accident) ([place]) Active Problems:   DVT (deep venous thrombosis) ([place])   Acute kidney injury superimposed on chronic kidney disease (HCC)   Normocytic anemia   Abnormal chest x-ray   Lumbar radiculopathy  Resolved Problems:   Syncope   Acute metabolic encephalopathy  Hospital Course: 81 year old man with history of hypertension hyperlipidemia recent treatment for MSSA bacteremia and completed IV antibiotic course.  History of prostate cancer and chronic low back pain.  Presented to the hospital after a syncopal episode.  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  The patient was also diagnosed with bilateral lower extremity DVT.  Neurology would like to hold off on major anticoagulation for 2 weeks.  IVC filter placed on 11/25/2021.  TOC looking into rehabs but unable to find a rehab.  Patient did better with physical therapy and the plan is to go home with home health.  Assessment and Plan: * CVA (cerebral vascular accident) ([place]) MRI showing a new interval infarct in left occipital lobe which is likely subacute in chronicity and associated with cortical laminar necrosis and/or petechial hemorrhage.  Neurology cleared to be on aspirin.  LDL 99.  Patient on Crestor.  Case discussed with neurology and they would recommend not giving full anticoagulation for 2 weeks.  Physical therapy recommending rehab  DVT (deep venous thrombosis) (HCC) Acute appearing, near occlusive DVT with the left profundus femoral  vein protruding into the common femoral veins.  Occlusive DVT visualized in posterior tibial and peroneal veins of the lower extremities bilaterally.  Perfusion scan negative for PE.  IVC filter placed by Dr. Lucky Cowboy on 11/25/2021. --neurology and they would recommend not giving full anticoagulation for 2 weeks. --I prescribed Eliquis to be started on 12/08/2021.  Benefits and risks of Eliquis explained to patient's wife and patient.  Risk of bleeding explained.  If he is able to tolerate the Eliquis then the filter can be removed as outpatient.  Syncope-resolved as of 12/01/2021 Could be secondary to stroke and/or hypotension.   Blood pressure stable.  Acute metabolic encephalopathy-resolved as of 12/01/2021 Likely secondary to stroke.  Mental status improved.  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  Creatinine 1.65 on presentation down to 1.32 with a GFR 55 currently.  Normocytic anemia Last hemoglobin 9.2.  Continue to monitor closely.    Abnormal chest x-ray 1.4 cm nodular opacity projecting over the anterior distal thoracic spine which could represent a pulmonary nodule versus overlapping osseous structures.  Of note, the patient did have a CT scan of the chest on 09/14/2021 a few months ago that was negative.  Lumbar radiculopathy Patient on gabapentin and oxycodone as needed.         Consultants: Vascular surgery, neurology Procedures performed: IVC filter Disposition: Home health Diet recommendation:  Cardiac diet DISCHARGE MEDICATION: Allergies as of 12/02/2021       Reactions   Atorvastatin Other (See Comments)        Medication List     STOP taking  these medications    hydrALAZINE 10 MG tablet Commonly known as: APRESOLINE   potassium chloride SA 20 MEQ tablet Commonly known as: KLOR-CON M   traMADol 50 MG tablet Commonly known as: ULTRAM       TAKE these medications     acetaminophen 325 MG tablet Commonly known as: TYLENOL Take 2 tablets (650 mg total) by mouth every 4 (four) hours as needed for mild pain (or temp > 37.5 C (99.5 F)).   amLODipine 10 MG tablet Commonly known as: NORVASC Take 1 tablet (10 mg total) by mouth daily.   apixaban 5 MG Tabs tablet Commonly known as: ELIQUIS Two tabs po wice a day for one week then one tab po twice a day afterwards Start taking on: December 08, 2021   aspirin EC 81 MG tablet Take 1 tablet (81 mg total) by mouth daily. Swallow whole. Start taking on: December 03, 2021   docusate sodium 100 MG capsule Commonly known as: Colace Take 1 capsule (100 mg total) by mouth daily as needed.   gabapentin 100 MG capsule Commonly known as: Neurontin Take 1 capsule (100 mg total) by mouth at bedtime.   lidocaine 5 % ointment Commonly known as: XYLOCAINE Apply 1 application topically as needed for moderate pain.   lisinopril 40 MG tablet Commonly known as: ZESTRIL Take 1 tablet (40 mg total) by mouth daily.   polyethylene glycol 17 g packet Commonly known as: MIRALAX / GLYCOLAX Take 17 g by mouth daily.   rosuvastatin 40 MG tablet Commonly known as: CRESTOR Take 1 tablet (40 mg total) by mouth daily. Start taking on: December 03, 2021 What changed:  medication strength how much to take   terazosin 5 MG capsule Commonly known as: HYTRIN Take 5 mg by mouth at bedtime.        Follow-up Information     [facility] Follow up in 2 week(s).   Why: stroke follow up Contact information: [address]     [place] [address] [phone]        Algernon Huxley, MD Follow up in 6 week(s).   Specialties: Vascular Surgery, Radiology, Interventional Cardiology Contact information: [place] [address] [phone]         Your medical Doctor Follow up in 5 day(s).                 Discharge Exam: Filed Weights   11/24/21 0932   Weight: 86.2 kg   Physical Exam HENT:     Head: Normocephalic.     Mouth/Throat:     Pharynx: No oropharyngeal exudate.  Eyes:     General: Lids are normal.     Conjunctiva/sclera: Conjunctivae normal.  Cardiovascular:     Rate and Rhythm: Normal rate and regular rhythm.     Heart sounds: Normal heart sounds, S1 normal and S2 normal.  Pulmonary:     Breath sounds: No decreased breath sounds, wheezing, rhonchi or rales.  Abdominal:     Palpations: Abdomen is soft.     Tenderness: There is no abdominal tenderness.  Musculoskeletal:     Right lower leg: Swelling present.     Left lower leg: Swelling present.  Skin:    General: Skin is warm.     Findings: No rash.  Neurological:     Mental Status: He is alert.     Comments: Able to straight leg raise.  Able to lift his arms up off the  chair.  Answering questions much better today than yesterday.      Condition at discharge: stable  The results of significant diagnostics from this hospitalization (including imaging, microbiology, ancillary and laboratory) are listed below for reference.   Imaging Studies: CT HEAD WO CONTRAST (5MM)  Result Date: 11/26/2021 CLINICAL DATA:  Stroke follow-up EXAM: CT HEAD WITHOUT CONTRAST TECHNIQUE: Contiguous axial images were obtained from the base of the skull through the vertex without intravenous contrast. RADIATION DOSE REDUCTION: This exam was performed according to the departmental dose-optimization program which includes automated exposure control, adjustment of the mA and/or kV according to patient size and/or use of iterative reconstruction technique. COMPARISON:  Two days ago FINDINGS: Brain: Well established left occipital infarction, non progressed. Chronic small vessel ischemia which is extensive. 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 Cerebral volume loss in keeping with age. No hematoma, hydrocephalus, or swelling Vascular: No hyperdense vessel or unexpected calcification. Skull:  Normal. Negative for fracture or focal lesion. Sinuses/Orbits: No acute finding. IMPRESSION: 1. No new or progressive finding. 2. Extensive chronic small vessel ischemia with subacute to chronic left occipital infarct. Electronically Signed   By: Jorje Guild M.D.   On: 11/26/2021 06:13   ECHOCARDIOGRAM COMPLETE  Result Date: 11/25/2021    ECHOCARDIOGRAM REPORT   Patient Name:   KHALED HERDA Date of Exam: 11/24/2021 Medical Rec #:  [id number]         Height:       66.0 in Accession #:    [id number]        Weight:       190.0 lb Date of Birth:  15-Mar-1941         BSA:          1.957 m Patient Age:    81 years          BP:           167/82 mmHg Patient Gender: M                 HR:           68 bpm. Exam Location:  [facility] Procedure: 2D Echo, Cardiac Doppler and Color Doppler Indications:     I63.9 Stroke  History:         Patient has no prior history of Echocardiogram examinations.                  CAD, Stroke; Risk Factors:Hypertension.  Sonographer:     Cresenciano Lick RDCS Referring Phys:  [id number] RONDELL A SMITH Diagnosing Phys: Kathlyn Sacramento MD IMPRESSIONS  1. Left ventricular ejection fraction, by estimation, is 60 to 65%. The left ventricle has normal function. The left ventricle has no regional wall motion abnormalities. There is mild left ventricular hypertrophy. Left ventricular diastolic parameters were normal.  2. Right ventricular systolic function is normal. The right ventricular size is normal. Tricuspid regurgitation signal is inadequate for assessing PA pressure.  3. Left atrial size was mildly dilated.  4. The mitral valve is normal in structure. Mild to moderate mitral valve regurgitation. No evidence of mitral stenosis.  5. The aortic valve is normal in structure. Aortic valve regurgitation is not visualized. Aortic valve sclerosis/calcification is present, without any evidence of aortic stenosis. FINDINGS  Left Ventricle: Left ventricular ejection fraction, by estimation, is 60 to  65%. The left ventricle has normal function. The left ventricle has no regional wall motion abnormalities. The left ventricular internal cavity size was normal in size. There  is  mild left ventricular hypertrophy. Left ventricular diastolic parameters were normal. Right Ventricle: The right ventricular size is normal. No increase in right ventricular wall thickness. Right ventricular systolic function is normal. Tricuspid regurgitation signal is inadequate for assessing PA pressure. Left Atrium: Left atrial size was mildly dilated. Right Atrium: Right atrial size was normal in size. Pericardium: There is no evidence of pericardial effusion. Mitral Valve: The mitral valve is normal in structure. Mild mitral annular calcification. Mild to moderate mitral valve regurgitation. No evidence of mitral valve stenosis. Tricuspid Valve: The tricuspid valve is normal in structure. Tricuspid valve regurgitation is not demonstrated. No evidence of tricuspid stenosis. Aortic Valve: The aortic valve is normal in structure. Aortic valve regurgitation is not visualized. Aortic valve sclerosis/calcification is present, without any evidence of aortic stenosis. Pulmonic Valve: The pulmonic valve was normal in structure. Pulmonic valve regurgitation is not visualized. No evidence of pulmonic stenosis. Aorta: The aortic root is normal in size and structure. Venous: The inferior vena cava was not well visualized. IAS/Shunts: No atrial level shunt detected by color flow Doppler.  LEFT VENTRICLE PLAX 2D LVIDd:         4.60 cm   Diastology LVIDs:         2.40 cm   LV e' medial:    7.72 cm/s LV PW:         1.00 cm   LV E/e' medial:  12.7 LV IVS:        0.80 cm   LV e' lateral:   8.49 cm/s LVOT diam:     2.20 cm   LV E/e' lateral: 11.5 LV SV:         84 LV SV Index:   43 LVOT Area:     3.80 cm  RIGHT VENTRICLE RV Basal diam:  3.60 cm RV S prime:     9.57 cm/s TAPSE (M-mode): 2.0 cm LEFT ATRIUM             Index        RIGHT ATRIUM            Index LA diam:        4.50 cm 2.30 cm/m   RA Area:     10.90 cm LA Vol (A2C):   93.2 ml 47.63 ml/m  RA Volume:   20.70 ml  10.58 ml/m LA Vol (A4C):   56.6 ml 28.93 ml/m LA Biplane Vol: 77.5 ml 39.61 ml/m  AORTIC VALVE LVOT Vmax:   88.70 cm/s LVOT Vmean:  61.400 cm/s LVOT VTI:    0.220 m  AORTA Ao Root diam: 3.40 cm Ao Asc diam:  3.30 cm MITRAL VALVE MV Area (PHT): 3.08 cm     SHUNTS MV Decel Time: 246 msec     Systemic VTI:  0.22 m MV E velocity: 97.90 cm/s   Systemic Diam: 2.20 cm MV A velocity: 109.00 cm/s MV E/A ratio:  0.90 Kathlyn Sacramento MD Electronically signed by Kathlyn Sacramento MD Signature Date/Time: 11/25/2021/5:49:55 PM    Final    PERIPHERAL VASCULAR CATHETERIZATION  Result Date: 11/25/2021 See surgical note for result.  NM Pulmonary Perfusion  Result Date: 11/25/2021 CLINICAL DATA:  81 year old male with suspected pulmonary embolism EXAM: NUCLEAR MEDICINE PERFUSION LUNG SCAN TECHNIQUE: Perfusion images were obtained in multiple projections after intravenous injection of radiopharmaceutical. Ventilation scans intentionally deferred if perfusion scan and chest x-ray adequate for interpretation during COVID 19 epidemic. RADIOPHARMACEUTICALS:  4.42 mCi Tc-39mMAA IV COMPARISON:  Chest x-ray 11/24/2021 FINDINGS:  Planar scintigraphic images of the chest were performed after standard administration of technetium MAA in standard projections. No large perfusion defects. IMPRESSION: No large perfusion defects identified, compatible with low probability of PE. Electronically Signed   By: Corrie Mckusick D.O.   On: 11/25/2021 12:18   US Venous Img Lower Bilateral (DVT)  Result Date: 11/25/2021 CLINICAL DATA:  Elevated D-dimer, prostate cancer EXAM: BILATERAL LOWER EXTREMITY VENOUS DOPPLER ULTRASOUND TECHNIQUE: Gray-scale sonography with compression, as well as color and duplex ultrasound, were performed to evaluate the deep venous system(s) from the level of the common femoral vein through the popliteal  and proximal calf veins. COMPARISON:  None Available. FINDINGS: VENOUS The common femoral veins bilaterally are widely patent with appropriate antegrade flow, compressibility, demonstrate appropriate respiratory variation. Greater saphenous veins of the saphenofemoral junctions are patent bilaterally. The femoral veins and popliteal veins are widely patent with appropriate antegrade flow, compressibility, and augmentation. The right profundus femoral vein is patent with appropriate antegrade flow where visualized. The left peripheral mid femoral vein demonstrates acute appearing, near occlusive thrombus protruding into the common femoral venous confluence. There is occlusive DVT identified within the visualized posterior tibial and peroneal veins of the lower extremities bilaterally. This does not appear to extend in the popliteal veins on the presented images. OTHER None. Limitations: none IMPRESSION: 1. Acute appearing, near occlusive DVT within the left profundus femoral vein protruding into the common femoral venous confluence without occlusion of this structure. 2. Occlusive DVT within the visualized posterior tibial and peroneal veins of the lower extremities bilaterally. This does not appear to extend in the popliteal veins on the presented images. 3. These results will be called to the ordering clinician or representative by the Radiologist Assistant, and communication documented in the PACS or [employer]. Electronically Signed   By: Fidela Salisbury M.D.   On: 11/25/2021 02:08   MR ANGIO HEAD WO CONTRAST  Result Date: 11/24/2021 CLINICAL DATA:  Stroke EXAM: MRA NECK WITHOUT CONTRAST MRA HEAD WITHOUT CONTRAST TECHNIQUE: Angiographic images of the Circle of Willis were acquired using MRA technique without intravenous contrast. COMPARISON:  MRI head 11/24/2021 FINDINGS: MRA NECK FINDINGS Carotid bifurcation widely patent without stenosis. Both vertebral arteries are patent with antegrade flow. No  vertebral artery stenosis. MRA HEAD FINDINGS Internal carotid artery widely patent bilaterally without stenosis. Moderate stenosis left A2 segment. Mild stenosis right A2 segment. Middle cerebral arteries patent without stenosis. No vascular malformation Both vertebral arteries are patent to the basilar. Right PICA patent. Left PICA not visualized. AICA patent bilaterally. Basilar patent. Superior cerebellar and posterior cerebral arteries patent. Fetal origin right posterior cerebral artery. Mild stenosis distal right posterior cerebral artery. Mild stenosis distal posterior cerebral artery bilaterally. IMPRESSION: Negative MRA neck Moderate left anterior cerebral artery stenosis and mild right anterior cerebral artery stenosis Mild stenosis distal PCA bilaterally. No intracranial large vessel occlusion. Electronically Signed   By: Franchot Gallo M.D.   On: 11/24/2021 17:36   MR ANGIO NECK WO CONTRAST  Result Date: 11/24/2021 CLINICAL DATA:  Stroke EXAM: MRA NECK WITHOUT CONTRAST MRA HEAD WITHOUT CONTRAST TECHNIQUE: Angiographic images of the Circle of Willis were acquired using MRA technique without intravenous contrast. COMPARISON:  MRI head 11/24/2021 FINDINGS: MRA NECK FINDINGS Carotid bifurcation widely patent without stenosis. Both vertebral arteries are patent with antegrade flow. No vertebral artery stenosis. MRA HEAD FINDINGS Internal carotid artery widely patent bilaterally without stenosis. Moderate stenosis left A2 segment. Mild stenosis right A2 segment. Middle cerebral arteries patent without stenosis.  No vascular malformation Both vertebral arteries are patent to the basilar. Right PICA patent. Left PICA not visualized. AICA patent bilaterally. Basilar patent. Superior cerebellar and posterior cerebral arteries patent. Fetal origin right posterior cerebral artery. Mild stenosis distal right posterior cerebral artery. Mild stenosis distal posterior cerebral artery bilaterally. IMPRESSION: Negative  MRA neck Moderate left anterior cerebral artery stenosis and mild right anterior cerebral artery stenosis Mild stenosis distal PCA bilaterally. No intracranial large vessel occlusion. Electronically Signed   By: Franchot Gallo M.D.   On: 11/24/2021 17:36   MR BRAIN WO CONTRAST  Result Date: 11/24/2021 CLINICAL DATA:  Stroke, follow up EXAM: MRI HEAD WITHOUT CONTRAST TECHNIQUE: Multiplanar, multiecho pulse sequences of the brain and surrounding structures were obtained without intravenous contrast. COMPARISON:  CT head from the same day.  MRI Sep 14, 2021. FINDINGS: Brain: In comparison to MRI from Sep 14, 2021, new/interval infarct in the left occipital lobe (left PCA territory) with a few small areas of DWI hyperintensity in this region potentially representing evolving subacute infarct and/or artifact related to cortical laminar necrosis/petechial hemorrhage described below. Areas of T1 hyperintensity and extensive curvilinear susceptibility effect is region is compatible with laminar necrosis and/or petechial hemorrhage. No mass occupying acute hemorrhage. There is edema in this region without significant mass effect. No midline shift. No hydrocephalus or extra-axial fluid collection. Additional moderate scattered T2/FLAIR hyperintensities in the white matter, nonspecific but compatible with chronic microvascular ischemic disease. Small remote right cerebellar lacunar infarcts. Vascular: Major arterial flow voids are maintained skull base. Skull and upper cervical spine: Normal marrow signal. Sinuses/Orbits: Largely clear sinuses.  No acute orbital findings. Other: No mastoid effusions. IMPRESSION: 1. In comparison to MRI from Sep 14, 2021, new/interval infarct in the left occipital lobe (left PCA territory), which is likely subacute in chronicity and described above. Associated cortical laminar necrosis and/or petechial hemorrhage. 2. Moderate chronic microvascular ischemic disease. Electronically Signed    By: Margaretha Sheffield M.D.   On: 11/24/2021 16:54   CT Head Wo Contrast  Result Date: 11/24/2021 CLINICAL DATA:  Stroke suspected EXAM: CT HEAD WITHOUT CONTRAST TECHNIQUE: Contiguous axial images were obtained from the base of the skull through the vertex without intravenous contrast. RADIATION DOSE REDUCTION: This exam was performed according to the departmental dose-optimization program which includes automated exposure control, adjustment of the mA and/or kV according to patient size and/or use of iterative reconstruction technique. COMPARISON:  09/14/2021 FINDINGS: Brain: Moderate area of hypodensity in the left occipital lobe and inferior parietal lobe, which is new from the prior exam, with effacement of the sulci. Possible faint curvilinear hyperdensity in this region may represent petechial hemorrhage. No parenchymal hematoma. No mass, mass effect, or midline shift. No hydrocephalus or additional extra-axial collection. Vascular: No definite hyperdense vessel. Skull: No acute osseous abnormality. Sinuses/Orbits: No acute finding. Other: The mastoids are well aerated. IMPRESSION: Hypodensity in the left occipital and inferior parietal lobe is new from the prior exam and concerning for acute and/or subacute infarct. Possible faint curvilinear hyperdensity in this region may represent petechial hemorrhage. No intraparenchymal hematoma or significant midline shift. These results were called by telephone at the time of interpretation on 11/24/2021 at 1:52 pm to provider [facility] , who verbally acknowledged these results. Electronically Signed   By: Merilyn Baba M.D.   On: 11/24/2021 13:53   DG Chest 2 View  Result Date: 11/24/2021 CLINICAL DATA:  Syncopal episode today. EXAM: CHEST - 2 VIEW COMPARISON:  Chest radiograph 09/14/2021; CT chest 09/14/2021.  FINDINGS: The heart size and mediastinal contours are within normal limits. Trace pleural fluid at the posterior costophrenic angle. A 1.4 cm nodular  opacity projects over the anterior aspect of the distal thoracic spine, seen on lateral view only. No other focal consolidation. No pneumothorax. Multilevel degenerative changes in the mid to distal thoracic spine. IMPRESSION: A 1.4 cm nodular opacity projecting over the anterior distal thoracic spine could represent a pulmonary nodule versus overlapping osseous structures. Recommend CT chest for further evaluation. Trace pleural fluid at the posterior costophrenic angle. No other focal consolidation. Electronically Signed   By: Ileana Roup M.D.   On: 11/24/2021 13:19    Microbiology: Results for orders placed or performed during the hospital encounter of 11/24/21  Culture, blood (Routine X 2) w Reflex to ID Panel     Status: None   Collection Time: 11/24/21  9:34 PM   Specimen: BLOOD  Result Value Ref Range Status   Specimen Description BLOOD LEFT ASSIST CONTROL  Final   Special Requests   Final    BOTTLES DRAWN AEROBIC AND ANAEROBIC Blood Culture adequate volume   Culture   Final    NO GROWTH 5 DAYS Performed at [facility], [address], [place], [place] [address]    Report Status 11/29/2021 FINAL  Final  Culture, blood (Routine X 2) w Reflex to ID Panel     Status: None   Collection Time: 11/24/21  9:40 PM   Specimen: BLOOD  Result Value Ref Range Status   Specimen Description BLOOD LEFT HAND  Final   Special Requests   Final    BOTTLES DRAWN AEROBIC AND ANAEROBIC Blood Culture adequate volume   Culture   Final    NO GROWTH 5 DAYS Performed at [facility], [address] [place], [place] [address]    Report Status 11/29/2021 FINAL  Final    Labs: CBC: Recent Labs  Lab 11/26/21 0406 11/28/21 0558 11/29/21 0421  WBC 6.0 6.2 7.6  HGB 8.4* 8.7* 9.2*  HCT 27.9* 28.0* 30.4*  MCV 92.1 90.3 91.3  PLT 233 244 951   Basic Metabolic Panel: Recent Labs  Lab 11/26/21 0406 11/28/21 0558 11/29/21 0421  NA 142 138 140  K 4.2 4.3 4.4  CL 111 109 110   CO2 '25 25 27  '$ GLUCOSE 83 86 91  BUN 12 20 24*  CREATININE 1.32* 1.21 1.32*  CALCIUM 8.3* 8.5* 8.4*  MG  --  2.1 2.1    Discharge time spent: greater than 30 minutes.  Signed: Loletha Grayer, MD [facility] 12/02/2021

## 2021-12-02 NOTE — Progress Notes (Signed)
Physical Therapy Treatment Patient Details Name: Mark [name] MRN: [id number] DOB: Jan 24, 1941 Today's Date: 12/02/2021   History of Present Illness Pt is an 81 yo male that presented to the ED for syncope, AMS. 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 PMH of hypertension, hyperlipidemia, CAD, CKD, prostate cancer s/p radiation.    PT Comments    Pt was pleasant and motivated to participate during the session and put forth good effort throughout. Pt required frequent cuing during ambulation to prevent bumping into obstacles most notably on the R side with poor carryover.  During dynamic balance training per below pt required occasional min A for stability and had some difficulty locating objects on his right side during reaching activities.  Pt remains at an elevated risk for falls and will benefit from PT services in a SNF setting upon discharge to safely address deficits listed in patient problem list for decreased caregiver assistance and eventual return to PLOF.     Recommendations for follow up therapy are one component of a multi-disciplinary discharge planning process, led by the attending physician.  Recommendations may be updated based on patient status, additional functional criteria and insurance authorization.  Follow Up Recommendations  Skilled nursing-short term rehab (<3 hours/day) Can patient physically be transported by private vehicle: Yes   Assistance Recommended at Discharge Frequent or constant Supervision/Assistance  Patient can return home with the following Assist for transportation;Assistance with cooking/housework;Direct supervision/assist for medications management;Direct supervision/assist for financial management;A little help with walking and/or transfers;A little help  with bathing/dressing/bathroom;Help with stairs or ramp for entrance   Equipment Recommendations  None recommended by PT    Recommendations for Other Services       Precautions / Restrictions Precautions Precautions: Fall Restrictions Weight Bearing Restrictions: No     Mobility  Bed Mobility Overal bed mobility: Needs Assistance Bed Mobility: Sit to Supine       Sit to supine: Supervision   General bed mobility comments: Extra time and effort required    Transfers Overall transfer level: Needs assistance Equipment used: Rolling walker (2 wheels) Transfers: Sit to/from Stand Sit to Stand: Min guard           General transfer comment: Min verbal cues for sequencing    Ambulation/Gait Ambulation/Gait assistance: Min guard Gait Distance (Feet): 80 Feet x 2 Assistive device: Rolling walker (2 wheels) Gait Pattern/deviations: Trunk flexed, Step-through pattern Gait velocity: decreased     General Gait Details: Slow cadence with mod verbal and tactile cues needed to scan environment to prevent bumping into obstacles, especially on the R side   Stairs             Wheelchair Mobility    Modified Rankin (Stroke Patients Only)       Balance Overall balance assessment: Needs assistance   Sitting balance-Leahy Scale: Good     Standing balance support: Reliant on assistive device for balance, During functional activity, Bilateral upper extremity supported Standing balance-Leahy Scale: Fair                              Cognition Arousal/Alertness: Awake/alert Behavior During Therapy: WFL for tasks assessed/performed  General Comments: short term memory deficits        Exercises Total Joint Exercises Long Arc Quad: Strengthening, Both, 15 reps Knee Flexion: Strengthening, Both, 15 reps Other Exercises Other Exercises: Dynamic standing balance training with feet apart, together, and  semi-tandem with reaching outside BOS    General Comments        Pertinent Vitals/Pain Pain Assessment Pain Assessment: No/denies pain    Home Living                          Prior Function            PT Goals (current goals can now be found in the care plan section) Progress towards PT goals: Progressing toward goals    Frequency    Min 2X/week      PT Plan Current plan remains appropriate    Co-evaluation              AM-PAC PT "6 Clicks" Mobility   Outcome Measure  Help needed turning from your back to your side while in a flat bed without using bedrails?: A Little Help needed moving from lying on your back to sitting on the side of a flat bed without using bedrails?: A Little Help needed moving to and from a bed to a chair (including a wheelchair)?: A Little Help needed standing up from a chair using your arms (e.g., wheelchair or bedside chair)?: A Little Help needed to walk in hospital room?: A Little Help needed climbing 3-5 steps with a railing? : A Lot 6 Click Score: 17    End of Session Equipment Utilized During Treatment: Gait belt Activity Tolerance: Patient tolerated treatment well Patient left: in chair;with call bell/phone within reach;with chair alarm set;with family/visitor present Nurse Communication: Mobility status;Other (comment) (Pt stated was "seeing bugs" in the room during the session that were not there) PT Visit Diagnosis: Other abnormalities of gait and mobility (R26.89)     Time: 5329-9242 PT Time Calculation (min) (ACUTE ONLY): 23 min  Charges:  $Gait Training: 8-22 mins $Therapeutic Exercise: 8-22 mins                     D. Scott Quindon Denker PT, DPT 12/02/21, 11:48 AM

## 2021-12-03 ENCOUNTER — Emergency Department: Payer: No Typology Code available for payment source

## 2021-12-03 ENCOUNTER — Encounter: Payer: Self-pay | Admitting: Emergency Medicine

## 2021-12-03 ENCOUNTER — Inpatient Hospital Stay
Admission: EM | Admit: 2021-12-03 | Discharge: 2021-12-08 | DRG: 175 | Disposition: A | Discharge status: Home Health | Payer: No Typology Code available for payment source | Attending: Internal Medicine | Admitting: Internal Medicine

## 2021-12-03 ENCOUNTER — Other Ambulatory Visit: Payer: Self-pay

## 2021-12-03 DIAGNOSIS — Z7901 Long term (current) use of anticoagulants: Secondary | ICD-10-CM | POA: Diagnosis not present

## 2021-12-03 DIAGNOSIS — N189 Chronic kidney disease, unspecified: Secondary | ICD-10-CM | POA: Diagnosis present

## 2021-12-03 DIAGNOSIS — Z96641 Presence of right artificial hip joint: Secondary | ICD-10-CM | POA: Diagnosis present

## 2021-12-03 DIAGNOSIS — Z95828 Presence of other vascular implants and grafts: Secondary | ICD-10-CM

## 2021-12-03 DIAGNOSIS — I2699 Other pulmonary embolism without acute cor pulmonale: Secondary | ICD-10-CM | POA: Diagnosis present

## 2021-12-03 DIAGNOSIS — I82403 Acute embolism and thrombosis of unspecified deep veins of lower extremity, bilateral: Secondary | ICD-10-CM | POA: Diagnosis not present

## 2021-12-03 DIAGNOSIS — Z8249 Family history of ischemic heart disease and other diseases of the circulatory system: Secondary | ICD-10-CM | POA: Diagnosis not present

## 2021-12-03 DIAGNOSIS — Z8546 Personal history of malignant neoplasm of prostate: Secondary | ICD-10-CM | POA: Diagnosis not present

## 2021-12-03 DIAGNOSIS — M79604 Pain in right leg: Secondary | ICD-10-CM

## 2021-12-03 DIAGNOSIS — G629 Polyneuropathy, unspecified: Secondary | ICD-10-CM

## 2021-12-03 DIAGNOSIS — E7800 Pure hypercholesterolemia, unspecified: Secondary | ICD-10-CM | POA: Diagnosis present

## 2021-12-03 DIAGNOSIS — Z86718 Personal history of other venous thrombosis and embolism: Secondary | ICD-10-CM

## 2021-12-03 DIAGNOSIS — R0789 Other chest pain: Secondary | ICD-10-CM | POA: Diagnosis not present

## 2021-12-03 DIAGNOSIS — R262 Difficulty in walking, not elsewhere classified: Secondary | ICD-10-CM | POA: Diagnosis present

## 2021-12-03 DIAGNOSIS — I2510 Atherosclerotic heart disease of native coronary artery without angina pectoris: Secondary | ICD-10-CM | POA: Diagnosis present

## 2021-12-03 DIAGNOSIS — G609 Hereditary and idiopathic neuropathy, unspecified: Secondary | ICD-10-CM | POA: Diagnosis not present

## 2021-12-03 DIAGNOSIS — Z79899 Other long term (current) drug therapy: Secondary | ICD-10-CM

## 2021-12-03 DIAGNOSIS — M47816 Spondylosis without myelopathy or radiculopathy, lumbar region: Secondary | ICD-10-CM | POA: Diagnosis present

## 2021-12-03 DIAGNOSIS — R2681 Unsteadiness on feet: Secondary | ICD-10-CM | POA: Diagnosis not present

## 2021-12-03 DIAGNOSIS — I4891 Unspecified atrial fibrillation: Secondary | ICD-10-CM | POA: Diagnosis present

## 2021-12-03 DIAGNOSIS — I10 Essential (primary) hypertension: Secondary | ICD-10-CM

## 2021-12-03 DIAGNOSIS — R079 Chest pain, unspecified: Secondary | ICD-10-CM

## 2021-12-03 DIAGNOSIS — Z7982 Long term (current) use of aspirin: Secondary | ICD-10-CM | POA: Diagnosis not present

## 2021-12-03 DIAGNOSIS — Z888 Allergy status to other drugs, medicaments and biological substances status: Secondary | ICD-10-CM | POA: Diagnosis not present

## 2021-12-03 DIAGNOSIS — I63532 Cerebral infarction due to unspecified occlusion or stenosis of left posterior cerebral artery: Secondary | ICD-10-CM | POA: Diagnosis present

## 2021-12-03 DIAGNOSIS — Z923 Personal history of irradiation: Secondary | ICD-10-CM

## 2021-12-03 DIAGNOSIS — E785 Hyperlipidemia, unspecified: Secondary | ICD-10-CM | POA: Diagnosis not present

## 2021-12-03 DIAGNOSIS — I129 Hypertensive chronic kidney disease with stage 1 through stage 4 chronic kidney disease, or unspecified chronic kidney disease: Secondary | ICD-10-CM | POA: Diagnosis present

## 2021-12-03 LAB — BASIC METABOLIC PANEL
Anion gap: 6 (ref 5–15)
BUN: 25 mg/dL — ABNORMAL HIGH (ref 8–23)
CO2: 23 mmol/L (ref 22–32)
Calcium: 8.5 mg/dL — ABNORMAL LOW (ref 8.9–10.3)
Chloride: 111 mmol/L (ref 98–111)
Creatinine, Ser: 1.34 mg/dL — ABNORMAL HIGH (ref 0.61–1.24)
GFR, Estimated: 54 mL/min — ABNORMAL LOW (ref >60)
Glucose, Bld: 93 mg/dL (ref 70–99)
Potassium: 4.1 mmol/L (ref 3.5–5.1)
Sodium: 140 mmol/L (ref 135–145)

## 2021-12-03 LAB — CBC WITH DIFFERENTIAL/PLATELET
Abs Immature Granulocytes: 0.05 10*3/uL (ref 0.00–0.07)
Basophils Absolute: 0.1 10*3/uL (ref 0.0–0.1)
Basophils Relative: 1 %
Eosinophils Absolute: 0.2 10*3/uL (ref 0.0–0.5)
Eosinophils Relative: 3 %
HCT: 32.3 % — ABNORMAL LOW (ref 39.0–52.0)
Hemoglobin: 9.8 g/dL — ABNORMAL LOW (ref 13.0–17.0)
Immature Granulocytes: 1 %
Lymphocytes Relative: 26 %
Lymphs Abs: 1.5 10*3/uL (ref 0.7–4.0)
MCH: 28.3 pg (ref 26.0–34.0)
MCHC: 30.3 g/dL (ref 30.0–36.0)
MCV: 93.4 fL (ref 80.0–100.0)
Monocytes Absolute: 0.6 10*3/uL (ref 0.1–1.0)
Monocytes Relative: 10 %
Neutro Abs: 3.5 10*3/uL (ref 1.7–7.7)
Neutrophils Relative %: 59 %
Platelets: 269 10*3/uL (ref 150–400)
RBC: 3.46 MIL/uL — ABNORMAL LOW (ref 4.22–5.81)
RDW: 13.6 % (ref 11.5–15.5)
WBC: 5.8 10*3/uL (ref 4.0–10.5)
nRBC: 0 % (ref 0.0–0.2)

## 2021-12-03 LAB — CBC
HCT: 30.2 % — ABNORMAL LOW (ref 39.0–52.0)
Hemoglobin: 9.2 g/dL — ABNORMAL LOW (ref 13.0–17.0)
MCH: 28.1 pg (ref 26.0–34.0)
MCHC: 30.5 g/dL (ref 30.0–36.0)
MCV: 92.4 fL (ref 80.0–100.0)
Platelets: 257 10*3/uL (ref 150–400)
RBC: 3.27 MIL/uL — ABNORMAL LOW (ref 4.22–5.81)
RDW: 13.6 % (ref 11.5–15.5)
WBC: 6.2 10*3/uL (ref 4.0–10.5)
nRBC: 0 % (ref 0.0–0.2)

## 2021-12-03 LAB — URINALYSIS, ROUTINE W REFLEX MICROSCOPIC
Bilirubin Urine: NEGATIVE
Glucose, UA: NEGATIVE mg/dL
Hgb urine dipstick: NEGATIVE
Ketones, ur: NEGATIVE mg/dL
Leukocytes,Ua: NEGATIVE
Nitrite: NEGATIVE
Protein, ur: NEGATIVE mg/dL
Specific Gravity, Urine: 1.004 — ABNORMAL LOW (ref 1.005–1.030)
pH: 6 (ref 5.0–8.0)

## 2021-12-03 LAB — CREATININE, SERUM
Creatinine, Ser: 1.21 mg/dL (ref 0.61–1.24)
GFR, Estimated: >60 mL/min (ref >60)

## 2021-12-03 LAB — TROPONIN I (HIGH SENSITIVITY)
Troponin I (High Sensitivity): 11 ng/L (ref <18)
Troponin I (High Sensitivity): 5 ng/L (ref <18)

## 2021-12-03 MED ORDER — ONDANSETRON HCL 4 MG/2ML IJ SOLN: 4.0000 mg | Freq: Four times a day (QID) | INTRAMUSCULAR | Status: DC | PRN

## 2021-12-03 MED ORDER — SODIUM CHLORIDE 0.9 % IV SOLN: INTRAVENOUS | Status: DC

## 2021-12-03 MED ORDER — ACETAMINOPHEN 650 MG RE SUPP: 650.0000 mg | Freq: Four times a day (QID) | RECTAL | Status: DC | PRN

## 2021-12-03 MED ORDER — ONDANSETRON HCL 4 MG PO TABS: 4.0000 mg | ORAL_TABLET | Freq: Four times a day (QID) | ORAL | Status: DC | PRN

## 2021-12-03 MED ORDER — TRAZODONE HCL 50 MG PO TABS: 25.0000 mg | ORAL_TABLET | Freq: Every evening | ORAL | Status: DC | PRN

## 2021-12-03 MED ORDER — ACETAMINOPHEN 325 MG PO TABS: 650.0000 mg | ORAL_TABLET | Freq: Four times a day (QID) | ORAL | Status: DC | PRN

## 2021-12-03 MED ORDER — SODIUM CHLORIDE 0.9 % IV BOLUS (SEPSIS)
1000.0000 mL | Freq: Once | INTRAVENOUS | Status: AC
  Administered 2021-12-03: 1000 mL via INTRAVENOUS

## 2021-12-03 MED ORDER — IOHEXOL 350 MG/ML SOLN
75.0000 mL | Freq: Once | INTRAVENOUS | Status: AC | PRN
  Administered 2021-12-03: 61 mL via INTRAVENOUS

## 2021-12-03 MED ORDER — ENOXAPARIN SODIUM 40 MG/0.4ML IJ SOSY: 40.0000 mg | PREFILLED_SYRINGE | Freq: Every day | INTRAMUSCULAR | Status: DC

## 2021-12-03 MED ORDER — HEPARIN (PORCINE) 25000 UT/250ML-% IV SOLN
1400.0000 [IU]/h | INTRAVENOUS | Status: DC
  Administered 2021-12-03: 1400 [IU]/h via INTRAVENOUS
  Filled 2021-12-03: qty 250

## 2021-12-03 MED ORDER — MAGNESIUM HYDROXIDE 400 MG/5ML PO SUSP
30.0000 mL | Freq: Every day | ORAL | Status: DC | PRN
  Administered 2021-12-06: 30 mL via ORAL
  Filled 2021-12-03: qty 30

## 2021-12-03 NOTE — ED Triage Notes (Signed)
Pt arrived via [facility] from home with c/o right leg pain that woke pt this AM. Pt recently hospitalized on 8/8 for CVA with IVC filter placement on 8/9 due to bilateral DVT's. On arrival to ED pt denies pain. Pt was given '324mg'$  Aspirin by spouse prior to EMS arrival for pain.

## 2021-12-03 NOTE — Assessment & Plan Note (Signed)
-   We will continue Neurontin. ?

## 2021-12-03 NOTE — ED Notes (Signed)
IV placed by [place] nurse infiltrated while in CT. Pt brought back top ED room, pt denies pain in left arm. Left extremity moderately swollen. IV removed, extremity elevated, and ice pack placed on arm with instructions on when to remove ice pack.

## 2021-12-03 NOTE — H&P (Addendum)
[place]   PATIENT NAME: Mark [name]    MR#:  [id number]  DATE OF BIRTH:  18-Dec-1940  DATE OF ADMISSION:  12/03/2021  PRIMARY CARE PHYSICIAN: Donald Prose, MD   Patient is coming from: Home  REQUESTING/REFERRING PHYSICIAN:Wong, Purcell Nails, MD  CHIEF COMPLAINT:   Chief Complaint  Patient presents with   Leg Pain    HISTORY OF PRESENT ILLNESS:  Mark [name] is a 81 y.o. African-American male with medical history significant for coronary artery disease, hypertension, dyslipidemia and CVA, was discharged on 8/16 after having a day hospitalization for CVA complicated with DVT and given petechial hemorrhagic transformation the patient was placed on IVC filter given brief contraindication to anticoagulation done.  He presents today with right leg pain without significant swelling and mild dyspnea as well as mild chest pain that started earlier this morning when he woke up.  He denied any palpitations.  No nausea or vomiting or abdominal pain.  No cough or wheezing or hemoptysis.  No fever or chills.  No dysuria or or hematuria or flank pain.  No other bleeding diathesis.  ED Course: Upon presentation to the emergency room, vital signs were within normal.  Labs revealed a BUN of 24 with a creatinine 1.32 slightly higher than previous levels but pretty close.  CBC showed hemoglobin 9 point hematocrit 30.4 better than previous levels. EKG as reviewed by me :  EKG showed likely atrial fibrillation with controlled ventricular sponsor of 64 with poor R wave progression and LVH by voltage criteria. Imaging: Two-view chest x-ray showed low volume with basilar atelectasis.  Chest CTA revealed small burden of weblike nonocclusive clot at the right upper lobe, pulmonary artery bifurcation without evidence of right heart strain.  It showed trace bilateral pleural effusions and central bronchial wall thickening which may reflect the bronchitis and it showed aortic atherosclerosis.  Noncontrast  head CT scan showed no acute intra abnormalities.  It showed interval evolution of the left PCA territory infarction without evidence for acute hemorrhage.  It showed atrophy with chronic small vessel ischemic disease.  Contact was made with Dr. Su Monks who was agreeable with starting anticoagulation with IV heparin drip without bolus.  The patient will be admitted to a PCU bed for further evaluation and management. PAST MEDICAL HISTORY:   Past Medical History:  Diagnosis Date   Arthritis    Lower Back, Hips, and Hands.   Cancer [facility])    Prostate CA s/o Rad Tx   Chronic kidney disease    Coronary artery disease    Hypercholesterolemia    Hypertension    Radiation 09/30/2008-12/02/2010   7800 cGy  Dr.Ottelin   Stroke [facility])     PAST SURGICAL HISTORY:   Past Surgical History:  Procedure Laterality Date   CATARACT SURGERY     IVC FILTER INSERTION N/A 11/25/2021   Procedure: IVC FILTER INSERTION;  Surgeon: Algernon Huxley, MD;  Location: [place] [facility];  Service: Cardiovascular;  Laterality: N/A;   KNEE ARTHROSCOPY Left    QUADRICEPS TENDON REPAIR Right 12/15/2020   Procedure: REPAIR QUADRICEP TENDON;  Surgeon: Lovell Sheehan, MD;  Location: [facility] ORS;  Service: Orthopedics;  Laterality: Right;    SOCIAL HISTORY:   Social History   Tobacco Use   Smoking status: Never   Smokeless tobacco: Never  Substance Use Topics   Alcohol use: No    FAMILY HISTORY:   Family History  Problem Relation Age of Onset   Hypertension  Father    Healthy Sister     DRUG ALLERGIES:   Allergies  Allergen Reactions   Atorvastatin Other (See Comments)    REVIEW OF SYSTEMS:   ROS As per history of present illness. All pertinent systems were reviewed above. Constitutional, HEENT, cardiovascular, respiratory, GI, GU, musculoskeletal, neuro, psychiatric, endocrine, integumentary and hematologic systems were reviewed and are otherwise negative/unremarkable except for positive findings  mentioned above in the HPI.   MEDICATIONS AT HOME:   Prior to Admission medications   Medication Sig Start Date End Date Taking? Authorizing Provider  acetaminophen (TYLENOL) 325 MG tablet Take 2 tablets (650 mg total) by mouth every 4 (four) hours as needed for mild pain (or temp > 37.5 C (99.5 F)). 12/02/21  Yes Loletha Grayer, MD  apixaban (ELIQUIS) 5 MG TABS tablet Two tabs po wice a day for one week then one tab po twice a day afterwards 12/08/21  Yes Wieting, Richard, MD  aspirin EC 81 MG tablet Take 1 tablet (81 mg total) by mouth daily. Swallow whole. 12/03/21  Yes Wieting, Richard, MD  docusate sodium (COLACE) 100 MG capsule Take 1 capsule (100 mg total) by mouth daily as needed. 12/15/20 12/15/21 Yes Lovell Sheehan, MD  polyethylene glycol (MIRALAX / GLYCOLAX) 17 g packet Take 17 g by mouth daily. 12/02/21  Yes Wieting, Richard, MD  amLODipine (NORVASC) 10 MG tablet Take 1 tablet (10 mg total) by mouth daily. 09/18/21   Lorella Nimrod, MD  gabapentin (NEURONTIN) 100 MG capsule Take 1 capsule (100 mg total) by mouth at bedtime. 11/22/21 01/21/22  Menshew, Dannielle Karvonen, PA-C  lidocaine (XYLOCAINE) 5 % ointment Apply 1 application topically as needed for moderate pain. Patient not taking: Reported on 12/03/2021    [provider]  lisinopril (PRINIVIL,ZESTRIL) 40 MG tablet Take 1 tablet (40 mg total) by mouth daily. 05/04/16   Roselee Nova, MD  rosuvastatin (CRESTOR) 40 MG tablet Take 1 tablet (40 mg total) by mouth daily. 12/03/21   Loletha Grayer, MD  terazosin (HYTRIN) 5 MG capsule Take 5 mg by mouth at bedtime. 11/28/20   [provider]      VITAL SIGNS:  Blood pressure (!) 127/58, pulse (!) 59, temperature 98.6 F (37 C), temperature source Oral, resp. rate 20, SpO2 97 %.  PHYSICAL EXAMINATION:  Physical Exam  GENERAL:  81 y.o.-year-old African-American male patient lying in the bed with no acute distress.  EYES: Pupils equal, round, reactive to light and  accommodation. No scleral icterus. Extraocular muscles intact.  HEENT: Head atraumatic, normocephalic. Oropharynx and nasopharynx clear.  NECK:  Supple, no jugular venous distention. No thyroid enlargement, no tenderness.  LUNGS: Normal breath sounds bilaterally, no wheezing, rales,rhonchi or crepitation. No use of accessory muscles of respiration.  CARDIOVASCULAR: Regular rate and rhythm, S1, S2 normal. No murmurs, rubs, or gallops.  ABDOMEN: Soft, nondistended, nontender. Bowel sounds present. No organomegaly or mass.  EXTREMITIES: No pedal edema, cyanosis, or clubbing.  NEUROLOGIC: Cranial nerves II through XII are intact. Muscle strength 5/5 in all extremities. Sensation intact. Gait not checked.  PSYCHIATRIC: The patient is alert and oriented x 3.  Normal affect and good eye contact. SKIN: No obvious rash, lesion, or ulcer.   LABORATORY PANEL:   CBC Recent Labs  Lab 12/03/21 0506  WBC 5.8  HGB 9.8*  HCT 32.3*  PLT 269   ------------------------------------------------------------------------------------------------------------------  Chemistries  Recent Labs  Lab 11/29/21 0421 12/03/21 0506  NA 140 140  K 4.4 4.1  CL 110 111  CO2 27 23  GLUCOSE 91 93  BUN 24* 25*  CREATININE 1.32* 1.34*  CALCIUM 8.4* 8.5*  MG 2.1  --    ------------------------------------------------------------------------------------------------------------------  Cardiac Enzymes No results for input(s): "TROPONINI" in the last 168 hours. ------------------------------------------------------------------------------------------------------------------  RADIOLOGY:  CT Angio Chest PE W and/or Wo Contrast  Result Date: 12/03/2021 CLINICAL DATA:  Chest pain and altered mental status. History of DVT EXAM: CT ANGIOGRAPHY CHEST WITH CONTRAST TECHNIQUE: Multidetector CT imaging of the chest was performed using the standard protocol during bolus administration of intravenous contrast. Multiplanar CT  image reconstructions and MIPs were obtained to evaluate the vascular anatomy. RADIATION DOSE REDUCTION: This exam was performed according to the departmental dose-optimization program which includes automated exposure control, adjustment of the mA and/or kV according to patient size and/or use of iterative reconstruction technique. CONTRAST:  59m OMNIPAQUE IOHEXOL 350 MG/ML SOLN COMPARISON:  CT chest 09/15/2018 FINDINGS: Cardiovascular: There is adequate opacification of the pulmonary arteries to the segmental level. There is web-like nonocclusive clot at the bifurcation of the right upper lobe pulmonary artery (9-134). No other pulmonary emboli are identified. There is no convincing evidence of right heart strain. The main pulmonary artery is not dilated. The heart size is mildly enlarged. There is no pericardial effusion. There is coronary artery disease and calcified atherosclerotic plaque of the thoracic aorta. Mediastinum/Nodes: The thyroid is unremarkable. The esophagus is grossly unremarkable. There is no mediastinal, hilar, or axillary lymphadenopathy. Lungs/Pleura: The trachea and central airways are patent. There is prominent bilateral bronchial wall thickening. There are trace bilateral pleural effusions with adjacent opacities likely reflecting atelectasis. There is no focal consolidation. There is no pulmonary edema. There is no pneumothorax There are no suspicious nodules. Upper Abdomen: Bilateral renal cysts are noted. The imaged portions of the upper abdominal viscera are otherwise unremarkable. Musculoskeletal: There is no acute osseous abnormality or suspicious osseous lesion. Review of the MIP images confirms the above findings. IMPRESSION: 1. Small burden of web-like nonocclusive clot at the right upper lobe pulmonary artery bifurcation without evidence of right heart strain. 2. Trace bilateral pleural effusions and central bronchial wall thickening which may reflect bronchitis. Aortic  Atherosclerosis (ICD10-I70.0). These results were called by telephone at the time of interpretation on 12/03/2021 at 11:44 am to provider Dr. WJacelyn Grip who verbally acknowledged these results. Electronically Signed   By: PValetta MoleM.D.   On: 12/03/2021 11:44   CT HEAD WO CONTRAST (5MM)  Result Date: 12/03/2021 CLINICAL DATA:  Mental status changes. EXAM: CT HEAD WITHOUT CONTRAST TECHNIQUE: Contiguous axial images were obtained from the base of the skull through the vertex without intravenous contrast. RADIATION DOSE REDUCTION: This exam was performed according to the departmental dose-optimization program which includes automated exposure control, adjustment of the mA and/or kV according to patient size and/or use of iterative reconstruction technique. COMPARISON:  11/26/2021. FINDINGS: Brain: There is no evidence for acute hemorrhage, hydrocephalus, mass lesion, or abnormal extra-axial fluid collection. No definite CT evidence for acute infarction. 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 Diffuse loss of parenchymal volume is consistent with atrophy. Patchy low attenuation in the deep hemispheric and periventricular white matter is nonspecific, but likely reflects chronic microvascular ischemic demyelination. Vascular: No hyperdense vessel or unexpected calcification. Skull: No evidence for fracture. No worrisome lytic or sclerotic lesion. Sinuses/Orbits: The visualized paranasal sinuses and mastoid air cells are clear. Visualized portions of the globes and intraorbital fat are unremarkable. Other: None. IMPRESSION:  1. No acute intracranial abnormality. 2. 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 3. Atrophy with chronic small vessel ischemic disease. Electronically Signed   By: Misty Stanley M.D.   On: 12/03/2021 06:07   DG Chest 2 View  Result Date: 12/03/2021 CLINICAL DATA:  Chest pain. EXAM: CHEST - 2 VIEW  COMPARISON:  11/24/2021 FINDINGS: 0514 hours. Low volume film. Cardiopericardial silhouette is at upper limits of normal for size. Basilar atelectasis evident. No edema or focal airspace consolidation. No substantial pleural effusion. Telemetry leads overlie the chest. IMPRESSION: Low volume film with basilar atelectasis. Electronically Signed   By: Misty Stanley M.D.   On: 12/03/2021 06:04   DG Hip Unilat W or Wo Pelvis 2-3 Views Right  Result Date: 12/03/2021 CLINICAL DATA:  Right leg pain. EXAM: DG HIP (WITH OR WITHOUT PELVIS) 2-3V RIGHT COMPARISON:  None Available. FINDINGS: No evidence for acute fracture. SI joints and symphysis pubis unremarkable. Patient is status post left total hip replacement, incompletely visualize femoral component. AP and frog-leg lateral views of the right hip show no evidence for femoral neck fracture. Degenerative spurring noted in the acetabulum and femoral head. IMPRESSION: Degenerative changes in the right hip without acute bony findings. Electronically Signed   By: Misty Stanley M.D.   On: 12/03/2021 06:03      IMPRESSION AND PLAN:  Assessment and Plan: * Acute pulmonary embolism ([place]) - The patient will be admitted to an medical telemetry bed. - We will place the patient on IV heparin drip without bolus per protocol. - We will monitor him neurologically while on anticoagulation. - With neurological stability I expect he will be able to transition to Eliquis during this hospitalization.  DVT of lower extremity, bilateral ([place]) - The patient had an IVC filter during last admission. - She will be placed on IV heparin as mentioned above.  Essential hypertension - We will continue his antihypertensives.  Peripheral neuropathy - We will continue Neurontin  Dyslipidemia - We will continue statin therapy.   DVT prophylaxis: IV heparin drip Advanced Care Planning:  Code Status: full code. Family Communication:  The plan of care was discussed in details  with the patient (and family). I answered all questions. The patient agreed to proceed with the above mentioned plan. Further management will depend upon hospital course. Disposition Plan: Back to previous home environment Consults called: none. All the records are reviewed and case discussed with ED provider.  Status is: Inpatient   At the time of the admission, it appears that the appropriate admission status for this patient is inpatient.  This is judged to be reasonable and necessary in order to provide the required intensity of service to ensure the patient's safety given the presenting symptoms, physical exam findings and initial radiographic and laboratory data in the context of comorbid conditions.  The patient requires inpatient status due to high intensity of service, high risk of further deterioration and high frequency of surveillance required.  I certify that at the time of admission, it is my clinical judgment that the patient will require inpatient hospital care extending more than 2 midnights.                            Dispo: The patient is from: Home              Anticipated d/c is to: Home  Patient currently is not medically stable to d/c.              Difficult to place patient: No  Christel Mormon M.D on 12/03/2021 at 3:09 PM  [facility]   From 7 PM-7 AM, contact night-coverage [email or web address]  CC: Primary care physician; Donald Prose, MD

## 2021-12-03 NOTE — Assessment & Plan Note (Addendum)
-   The patient will be admitted to a PCU bed. - We will place the patient on IV heparin drip without bolus per protocol. - We will monitor him neurologically while on anticoagulation. - With neurological stability I expect he will be able to transition to Eliquis during this hospitalization.

## 2021-12-03 NOTE — Assessment & Plan Note (Signed)
-   We will continue his antihypertensives. 

## 2021-12-03 NOTE — ED Provider Notes (Addendum)
[facility] Provider Note    Event Date/Time   First MD Initiated Contact with Patient 12/03/21 0423     (approximate)   History   Leg Pain   HPI  Mark [name] is a 81 y.o. 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  Most of the history is obtained from patient's wife.  Patient's wife states tonight he woke up from sleep complaining of right thigh pain.  She states it is not abnormal for him to have pain in this leg that she attributes to muscle spasms as he has had a previous right hip replacement.  She states that normally he gets up to walk around and the pain will go away but she states he then started complaining of chest pain and was staring at her "like he did not know who I was".  She denies that the patient had any loss of consciousness or that he fell to the ground.  There was no seizure-like activity.  She called 911 and they recommended giving a full dose aspirin.  By the time EMS arrived patient no longer had any complaints.  Patient states he remembers having pain in the right leg but states this is gone.  He does not remember complaining of chest pain.  He denies any chest pain, shortness of breath currently.  No known fevers, cough.  No new numbness or weakness.  Wife denies any recent infectious symptoms.  No head injury.   History provided by patient, wife, EMS.    Past Medical History:  Diagnosis Date   Arthritis    Lower Back, Hips, and Hands.   Cancer [facility])    Prostate CA s/o Rad Tx   Chronic kidney disease    Coronary artery disease    Hypercholesterolemia    Hypertension    Radiation 09/30/2008-12/02/2010   7800 cGy  Dr.Ottelin   Stroke [facility])     Past Surgical History:  Procedure Laterality Date   CATARACT SURGERY     IVC  FILTER INSERTION N/A 11/25/2021   Procedure: IVC FILTER INSERTION;  Surgeon: Algernon Huxley, MD;  Location: [place] CV LAB;  Service: Cardiovascular;  Laterality: N/A;   KNEE ARTHROSCOPY Left    QUADRICEPS TENDON REPAIR Right 12/15/2020   Procedure: REPAIR QUADRICEP TENDON;  Surgeon: Lovell Sheehan, MD;  Location: [facility] ORS;  Service: Orthopedics;  Laterality: Right;    MEDICATIONS:  Prior to Admission medications   Medication Sig Start Date End Date Taking? Authorizing Provider  acetaminophen (TYLENOL) 325 MG tablet Take 2 tablets (650 mg total) by mouth every 4 (four) hours as needed for mild pain (or temp > 37.5 C (99.5 F)). 12/02/21   Loletha Grayer, MD  amLODipine (NORVASC) 10 MG tablet Take 1 tablet (10 mg total) by mouth daily. 09/18/21   Lorella Nimrod, MD  apixaban (ELIQUIS) 5 MG TABS tablet Two tabs po wice a day for one week then one tab po twice a day afterwards 12/08/21   Loletha Grayer, MD  aspirin EC 81 MG tablet Take 1 tablet (81 mg total) by mouth daily. Swallow whole. 12/03/21   Loletha Grayer, MD  docusate sodium (COLACE) 100 MG capsule Take 1 capsule (100 mg total) by mouth daily as needed. 12/15/20 12/15/21  Lovell Sheehan, MD  gabapentin (NEURONTIN) 100 MG capsule  Take 1 capsule (100 mg total) by mouth at bedtime. 11/22/21 01/21/22  Menshew, Dannielle Karvonen, PA-C  lidocaine (XYLOCAINE) 5 % ointment Apply 1 application topically as needed for moderate pain.    [provider]  lisinopril (PRINIVIL,ZESTRIL) 40 MG tablet Take 1 tablet (40 mg total) by mouth daily. 05/04/16   Roselee Nova, MD  polyethylene glycol (MIRALAX / GLYCOLAX) 17 g packet Take 17 g by mouth daily. 12/02/21   Loletha Grayer, MD  rosuvastatin (CRESTOR) 40 MG tablet Take 1 tablet (40 mg total) by mouth daily. 12/03/21   Loletha Grayer, MD  terazosin (HYTRIN) 5 MG capsule Take 5 mg by mouth at bedtime. 11/28/20   [provider]    Physical Exam   Triage Vital Signs: ED Triage Vitals  [12/03/21 0447]  Enc Vitals Group     BP 130/63     Pulse Rate 60     Resp 17     Temp 98.6 F (37 C)     Temp Source Oral     SpO2 99 %     Weight      Height      Head Circumference      Peak Flow      Pain Score      Pain Loc      Pain Edu?      Excl. in [place]?     Most recent vital signs: Vitals:   12/03/21 0600 12/03/21 0619  BP:  133/68  Pulse: (!) (P) 54 (!) 55  Resp: (P) 15 16  Temp:    SpO2:  99%    CONSTITUTIONAL: Alert and oriented to person and place but not year which wife reports is chronic.  Elderly.  In no distress. HEAD: Normocephalic, atraumatic EYES: Conjunctivae clear, pupils appear equal, sclera nonicteric ENT: normal nose; moist mucous membranes NECK: Supple, normal ROM CARD: RRR; S1 and S2 appreciated; no murmurs, no clicks, no rubs, no gallops RESP: Normal chest excursion without splinting or tachypnea; breath sounds clear and equal bilaterally; no wheezes, no rhonchi, no rales, no hypoxia or respiratory distress, speaking full sentences ABD/GI: Normal bowel sounds; non-distended; soft, non-tender, no rebound, no guarding, no peritoneal signs BACK: The back appears normal EXT: Normal ROM in all joints; no deformity noted, no edema; no cyanosis, full range of motion in both lower extremities and no pain with range of motion of any joints of the right leg.  Compartments are soft.  2+ DP pulses bilaterally.  No joint effusion.  No bony tenderness on the right lower extremity.  I am able to internally and externally rotate his right hip without any pain. SKIN: Normal color for age and race; warm; no rash on exposed skin NEURO: Moves all extremities equally, normal speech PSYCH: The patient's mood and manner are appropriate.   ED Results / Procedures / Treatments   LABS: (all labs ordered are listed, but only abnormal results are displayed) Labs Reviewed  CBC WITH DIFFERENTIAL/PLATELET - Abnormal; Notable for the following components:      Result  Value   RBC 3.46 (*)    Hemoglobin 9.8 (*)    HCT 32.3 (*)    All other components within normal limits  BASIC METABOLIC PANEL - Abnormal; Notable for the following components:   BUN 25 (*)    Creatinine, Ser 1.34 (*)    Calcium 8.5 (*)    GFR, Estimated 54 (*)    All other components within normal limits  URINALYSIS, ROUTINE W REFLEX MICROSCOPIC - Abnormal; Notable for the following components:   Color, Urine STRAW (*)    APPearance CLEAR (*)    Specific Gravity, Urine 1.004 (*)    All other components within normal limits  TROPONIN I (HIGH SENSITIVITY)  TROPONIN I (HIGH SENSITIVITY)     EKG:  EKG Interpretation  Date/Time:  Thursday December 03 2021 04:37:40 EDT Ventricular Rate:  64 PR Interval:  123 QRS Duration: 93 QT Interval:  431 QTC Calculation: 442 R Axis:   -29 Text Interpretation: Unknown rhythm, irregular rate Abnormal R-wave progression, early transition LVH by voltage Anterior Q waves, possibly due to LVH Confirmed by Pryor Curia [phone]) on 12/03/2021 5:00:18 AM          EKG Interpretation  Date/Time:  Thursday December 03 2021 07:13:21 EDT Ventricular Rate:  56 PR Interval:  138 QRS Duration: 97 QT Interval:  446 QTC Calculation: 431 R Axis:   -25 Text Interpretation: Sinus rhythm Abnormal R-wave progression, early transition Left ventricular hypertrophy Anterior Q waves, possibly due to LVH Borderline T abnormalities, inferior leads Confirmed by Pryor Curia [phone]) on 12/03/2021 7:17:52 AM         RADIOLOGY: My personal review and interpretation of imaging: CT head shows no acute abnormality.  Chest x-ray clear.  Right hip x-ray shows no fracture or dislocation.  I have personally reviewed all radiology reports.   CT HEAD WO CONTRAST (5MM)  Result Date: 12/03/2021 CLINICAL DATA:  Mental status changes. EXAM: CT HEAD WITHOUT CONTRAST TECHNIQUE: Contiguous axial images were obtained from the base of the skull through the vertex without  intravenous contrast. RADIATION DOSE REDUCTION: This exam was performed according to the departmental dose-optimization program which includes automated exposure control, adjustment of the mA and/or kV according to patient size and/or use of iterative reconstruction technique. COMPARISON:  11/26/2021. FINDINGS: Brain: There is no evidence for acute hemorrhage, hydrocephalus, mass lesion, or abnormal extra-axial fluid collection. No definite CT evidence for acute infarction. 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 Diffuse loss of parenchymal volume is consistent with atrophy. Patchy low attenuation in the deep hemispheric and periventricular white matter is nonspecific, but likely reflects chronic microvascular ischemic demyelination. Vascular: No hyperdense vessel or unexpected calcification. Skull: No evidence for fracture. No worrisome lytic or sclerotic lesion. Sinuses/Orbits: The visualized paranasal sinuses and mastoid air cells are clear. Visualized portions of the globes and intraorbital fat are unremarkable. Other: None. IMPRESSION: 1. No acute intracranial abnormality. 2. 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 3. Atrophy with chronic small vessel ischemic disease. Electronically Signed   By: Misty Stanley M.D.   On: 12/03/2021 06:07   DG Chest 2 View  Result Date: 12/03/2021 CLINICAL DATA:  Chest pain. EXAM: CHEST - 2 VIEW COMPARISON:  11/24/2021 FINDINGS: 0514 hours. Low volume film. Cardiopericardial silhouette is at upper limits of normal for size. Basilar atelectasis evident. No edema or focal airspace consolidation. No substantial pleural effusion. Telemetry leads overlie the chest. IMPRESSION: Low volume film with basilar atelectasis. Electronically Signed   By: Misty Stanley M.D.   On: 12/03/2021 06:04   DG Hip Unilat W or Wo Pelvis 2-3 Views Right  Result Date: 12/03/2021 CLINICAL DATA:  Right  leg pain. EXAM: DG HIP (WITH OR WITHOUT PELVIS) 2-3V RIGHT COMPARISON:  None Available. FINDINGS: No evidence for acute fracture. SI joints and symphysis pubis unremarkable. Patient is status post left total hip replacement, incompletely  visualize femoral component. AP and frog-leg lateral views of the right hip show no evidence for femoral neck fracture. Degenerative spurring noted in the acetabulum and femoral head. IMPRESSION: Degenerative changes in the right hip without acute bony findings. Electronically Signed   By: Misty Stanley M.D.   On: 12/03/2021 06:03     PROCEDURES:  Critical Care performed: No     .1-3 Lead EKG Interpretation  Performed by: Danish Ruffins, Delice Bison, DO Authorized by: Jessilyn Catino, Delice Bison, DO     Interpretation: normal     ECG rate:  55   ECG rate assessment: normal     Rhythm: sinus rhythm     Ectopy: none     Conduction: normal       IMPRESSION / MDM / ASSESSMENT AND PLAN / ED COURSE  I reviewed the triage vital signs and the nursing notes.    Patient here with brief episode of chest pain, right leg pain and possible altered mental status.  Now back to his baseline without any complaints.  The patient is on the cardiac monitor to evaluate for evidence of arrhythmia and/or significant heart rate changes.   DIFFERENTIAL DIAGNOSIS (includes but not limited to):   ACS, PE, dissection, worsening DVT, less likely fracture or dislocation of the hip, no signs of compartment syndrome or cerulea dolens.  Differential also includes intracranial hemorrhage, worsening CVA, UTI.  No signs of cellulitis of the right leg, doubt septic arthritis, gout.   Patient's presentation is most consistent with acute presentation with potential threat to life or bodily function.   PLAN: We will obtain CBC, CMP, urinalysis, troponin x2, chest x-ray, right hip x-ray, CT head.  Patient currently asymptomatic and wife reports that his baseline.  EKG shows no new ischemic change compared to  previous.   MEDICATIONS GIVEN IN ED: Medications  sodium chloride 0.9 % bolus 1,000 mL (has no administration in time range)     ED COURSE: Patient has chronic anemia which appears to be improving compared to recent labs.  No leukocytosis.  He has chronic kidney disease which is stable.  Normal electrolytes.  First troponin negative.  Second pending.  EKG shows what appears to be sinus arrhythmia but there is some artifact/baseline wander but no ischemia.  Will obtain a repeat EKG.  Chest x-ray, CT head and right hip x-ray reviewed and interpreted by myself and the radiologist.  Chest x-ray shows no infiltrate, edema or pneumothorax.  No widened mediastinum.  Right hip x-ray shows degenerative changes without any acute abnormality.  Wife reports he ambulates with a walker at baseline.  We will attempt to ambulate him here to see how he does to make sure he has no recurrence of the right leg pain.  CT of the head shows no acute abnormality.  He has evolution of the left PCA infarct without hemorrhage.  No new abnormality seen.  Patient continues to be asymptomatic and hemodynamically stable.  Plan is to obtain second troponin, repeat EKG and ambulate with walker.  Urinalysis also pending.  On review of his records it looks like he had an IVC filter placed on 11/25/2021 but due to possible petechial hemorrhage on his left occipital lobe neurology recommend holding off on full anticoagulation until 12/08/2021.  Due to this and his complaint of chest pain tonight, will obtain a CTA of his chest to rule out PE.  He is not having chest pain currently but wife reports that he has some chronic memory impairment which limits  obtaining accurate history from him.  Patient signed out to oncoming ED physician at 7 AM.   CONSULTS: Dispo pending further work-up.   OUTSIDE RECORDS REVIEWED: Reviewed patient's recent admission 11/24/2021 to 12/02/2021 for acute CVA, bilateral DVTs       FINAL CLINICAL  IMPRESSION(S) / ED DIAGNOSES   Final diagnoses:  Chest pain, unspecified type  Right leg pain     Rx / DC Orders   ED Discharge Orders     None        Note:  This document was prepared using Dragon voice recognition software and may include unintentional dictation errors.   Endiya Klahr, Delice Bison, DO 12/03/21 0703    Dreyden Rohrman, Delice Bison, DO 12/03/21 [phone]

## 2021-12-03 NOTE — Assessment & Plan Note (Signed)
-   The patient had an IVC filter during last admission. - She will be placed on IV heparin as mentioned above.

## 2021-12-03 NOTE — ED Provider Notes (Signed)
Received this patient in signout pending CTA chest.  CTA chest shows a segmental left-sided pulmonary embolism without right heart strain.  Patient remains hemodynamically stable at this time with minimal chest pain.  He had a recent CVA with questionable hemorrhagic conversion, known DVTs, got an IVC filter earlier this month and plan per neurology at that time was to hold anticoagulation until August 22.  He is not currently anticoagulated.  CT head this morning shows no acute hemorrhage surrounding the infarct.  In his stability at this time, we elected not to start anticoagulation until further discussion with neurology.  I discussed the plan of care with hospitalist as above, admitted to hospitalist service.   Lucillie Garfinkel, MD 12/03/21 [phone]

## 2021-12-03 NOTE — Consult Note (Signed)
ANTICOAGULATION CONSULT NOTE  Pharmacy Consult for Heparin Indication: pulmonary embolus  Allergies  Allergen Reactions   Atorvastatin Other (See Comments)    Patient Measurements:   Heparin Dosing Weight: 81.7 kg  Vital Signs: Temp: 98.6 F (37 C) (08/17 0447) Temp Source: Oral (08/17 0447) BP: 127/58 (08/17 1130) Pulse Rate: 59 (08/17 1130)  Labs: Recent Labs    12/03/21 0506 12/03/21 0928  HGB 9.8*  --   HCT 32.3*  --   PLT 269  --   CREATININE 1.34*  --   TROPONINIHS 11 5    Estimated Creatinine Clearance: 45.3 mL/min (A) (by C-G formula based on SCr of 1.34 mg/dL (H)).   Medical History: Past Medical History:  Diagnosis Date   Arthritis    Lower Back, Hips, and Hands.   Cancer [facility])    Prostate CA s/o Rad Tx   Chronic kidney disease    Coronary artery disease    Hypercholesterolemia    Hypertension    Radiation 09/30/2008-12/02/2010   7800 cGy  Dr.Ottelin   Stroke [facility])     Medications:  No history of chronic anticoagulant use PTA **of note, was to start Apixaban on 8/22**  Assessment: Pharmacy has been consulted to initiate and monitor heparin in 81yo male who was discharged on 8/16 after having a day hospitalized for CVA complicated with DVT and given petechial hemorrhagic transformation s/p IVC filter given brief contraindication to anticoagulation done.  Goal of Therapy:  Heparin level 0.3-0.7 units/ml Monitor platelets by anticoagulation protocol: Yes   Plan:  Will avoid bolus d/t recent CVA Start heparin infusion at 1400 units/hr Check anti-Xa level in 8 hours and daily while on heparin Continue to monitor H&H and platelets  Philemon Riedesel A Modene Andy 12/03/2021,4:01 PM

## 2021-12-03 NOTE — Assessment & Plan Note (Signed)
-   We will continue statin therapy. 

## 2021-12-04 ENCOUNTER — Other Ambulatory Visit (HOSPITAL_COMMUNITY): Payer: Self-pay

## 2021-12-04 DIAGNOSIS — R079 Chest pain, unspecified: Secondary | ICD-10-CM

## 2021-12-04 DIAGNOSIS — E785 Hyperlipidemia, unspecified: Secondary | ICD-10-CM | POA: Diagnosis not present

## 2021-12-04 DIAGNOSIS — M79604 Pain in right leg: Secondary | ICD-10-CM

## 2021-12-04 DIAGNOSIS — I82403 Acute embolism and thrombosis of unspecified deep veins of lower extremity, bilateral: Secondary | ICD-10-CM | POA: Diagnosis not present

## 2021-12-04 DIAGNOSIS — I2699 Other pulmonary embolism without acute cor pulmonale: Secondary | ICD-10-CM | POA: Diagnosis not present

## 2021-12-04 LAB — CBC WITH DIFFERENTIAL/PLATELET
Abs Immature Granulocytes: 0.05 10*3/uL (ref 0.00–0.07)
Basophils Absolute: 0.1 10*3/uL (ref 0.0–0.1)
Basophils Relative: 1 %
Eosinophils Absolute: 0.2 10*3/uL (ref 0.0–0.5)
Eosinophils Relative: 3 %
HCT: 29.8 % — ABNORMAL LOW (ref 39.0–52.0)
Hemoglobin: 9.1 g/dL — ABNORMAL LOW (ref 13.0–17.0)
Immature Granulocytes: 1 %
Lymphocytes Relative: 24 %
Lymphs Abs: 1.7 10*3/uL (ref 0.7–4.0)
MCH: 27.8 pg (ref 26.0–34.0)
MCHC: 30.5 g/dL (ref 30.0–36.0)
MCV: 91.1 fL (ref 80.0–100.0)
Monocytes Absolute: 0.6 10*3/uL (ref 0.1–1.0)
Monocytes Relative: 8 %
Neutro Abs: 4.4 10*3/uL (ref 1.7–7.7)
Neutrophils Relative %: 63 %
Platelets: 286 10*3/uL (ref 150–400)
RBC: 3.27 MIL/uL — ABNORMAL LOW (ref 4.22–5.81)
RDW: 13.6 % (ref 11.5–15.5)
WBC: 6.9 10*3/uL (ref 4.0–10.5)
nRBC: 0 % (ref 0.0–0.2)

## 2021-12-04 LAB — HEPARIN LEVEL (UNFRACTIONATED)
Heparin Unfractionated: 0.7 IU/mL (ref 0.30–0.70)
Heparin Unfractionated: 0.85 IU/mL — ABNORMAL HIGH (ref 0.30–0.70)
Heparin Unfractionated: 1.07 IU/mL — ABNORMAL HIGH (ref 0.30–0.70)

## 2021-12-04 LAB — COMPREHENSIVE METABOLIC PANEL
ALT: 22 U/L (ref 0–44)
AST: 30 U/L (ref 15–41)
Albumin: 2.8 g/dL — ABNORMAL LOW (ref 3.5–5.0)
Alkaline Phosphatase: 50 U/L (ref 38–126)
Anion gap: 3 — ABNORMAL LOW (ref 5–15)
BUN: 18 mg/dL (ref 8–23)
CO2: 23 mmol/L (ref 22–32)
Calcium: 8.6 mg/dL — ABNORMAL LOW (ref 8.9–10.3)
Chloride: 113 mmol/L — ABNORMAL HIGH (ref 98–111)
Creatinine, Ser: 1.14 mg/dL (ref 0.61–1.24)
GFR, Estimated: >60 mL/min (ref >60)
Glucose, Bld: 92 mg/dL (ref 70–99)
Potassium: 4.3 mmol/L (ref 3.5–5.1)
Sodium: 139 mmol/L (ref 135–145)
Total Bilirubin: 0.5 mg/dL (ref 0.3–1.2)
Total Protein: 6.2 g/dL — ABNORMAL LOW (ref 6.5–8.1)

## 2021-12-04 LAB — MAGNESIUM: Magnesium: 2 mg/dL (ref 1.7–2.4)

## 2021-12-04 LAB — PHOSPHORUS: Phosphorus: 4 mg/dL (ref 2.5–4.6)

## 2021-12-04 MED ORDER — HEPARIN (PORCINE) 25000 UT/250ML-% IV SOLN
1000.0000 [IU]/h | INTRAVENOUS | Status: DC
Start: 2021-12-04 — End: 2021-12-05
  Administered 2021-12-04: 1200 [IU]/h via INTRAVENOUS
  Filled 2021-12-04: qty 250

## 2021-12-04 NOTE — TOC Initial Note (Addendum)
Transition of Care [facility]) - Initial/Assessment Note    Patient Details  Name: Mark [name] MRN: [id number] Date of Birth: 1940-10-05  Transition of Care [facility]) CM/SW Contact:    Candie Chroman, LCSW Phone Number: 12/04/2021, 12:40 PM  Clinical Narrative:  Readmission prevention screen complete. CSW introduced role and explained that discharge planning would be discussed. PCP is Donald Prose, MD at [facility] in [place] and Dr. Franchot Heidelberg at the [facility]. Patient was previously driving himself to appointments but wife will be driving him from now on. [facility] [address] No issues obtaining medications. He was set up with [facility] for PT, OT, RN, aide prior to discharge yesterday. CSW will keep them updated as to discharge date. He has a RW, wheelchair, bedside commode, and accessible shower at home. Wife thinks they will need a hospital bed and handrail to assist him with getting into the house. [facility]. Contacted the [place] and they will have his PCP office call back. No further concerns. CSW encouraged patient to contact CSW as needed. CSW will continue to follow patient for support and facilitate return home once stable. Wife will transport him home.           3:21 pm: CSW acknowledges consult "Please evaluate patient's insurance for the cheapest DOAC. Notify physician when research completed."  Pharmacy will complete.      Expected Discharge Plan: [place] Barriers to Discharge: Continued Medical Work up   Patient Goals and CMS Choice     Choice offered to / list presented to : Patient, Spouse  Expected Discharge Plan and Services Expected Discharge Plan: [place] Acute Care Choice: Home Health, Durable Medical Equipment Living arrangements for the past 2 months: [place] Arranged: RN, PT, OT, Nurse's Aide [place] Agency: Other - See  comment [facility]) Date HH Agency Contacted: 12/04/21   Representative spoke with at [place]: Rema Jasmine  Prior Living Arrangements/Services Living arrangements for the past 2 months: [place] Lives with:: Spouse Patient language and need for interpreter reviewed:: Yes Do you feel safe going back to the place where you live?: Yes      Need for Family Participation in Patient Care: Yes (Comment) Care giver support system in place?: Yes (comment) Current home services: DME Criminal Activity/Legal Involvement Pertinent to Current Situation/Hospitalization: No - Comment as needed  Activities of Daily Living Home Assistive Devices/Equipment: Environmental consultant (specify type), Wheelchair ADL Screening (condition at time of admission) Patient's cognitive ability adequate to safely complete daily activities?: Yes Is the patient deaf or have difficulty hearing?: No Does the patient have difficulty seeing, even when wearing glasses/contacts?: No Does the patient have difficulty concentrating, remembering, or making decisions?: No Patient able to express need for assistance with ADLs?: Yes Does the patient have difficulty dressing or bathing?: Yes Independently performs ADLs?: No Communication: Independent Dressing (OT): Needs assistance Is this a change from baseline?: Pre-admission baseline Grooming: Needs assistance Is this a change from baseline?: Pre-admission baseline Feeding: Independent Bathing: Needs assistance Is this a change from baseline?: Pre-admission baseline Toileting: Needs assistance Is this a change from baseline?: Pre-admission baseline In/Out Bed: Needs assistance Is this a change from baseline?: Pre-admission baseline Walks in Home:  Needs assistance Is this a change from baseline?: Pre-admission baseline Does the patient have difficulty walking or climbing stairs?: Yes Weakness of Legs: Both Weakness of Arms/Hands: Both  Permission Sought/Granted Permission  sought to share information with : Facility Sport and exercise psychologist, Family Supports    Share Information with NAME: Leonides Grills  Permission granted to share info w AGENCY: [place] granted to share info w Relationship: Wife  Permission granted to share info w Contact Information: [phone]  Emotional Assessment Appearance:: Appears stated age Attitude/Demeanor/Rapport: Engaged, Gracious Affect (typically observed): Accepting, Appropriate, Calm, Pleasant Orientation: : Oriented to Self, Oriented to Place, Oriented to  Time, Oriented to Situation Alcohol / Substance Use: Not Applicable Psych Involvement: No (comment)  Admission diagnosis:  Right leg pain [M79.604] Acute pulmonary embolism (HCC) [I26.99] Other acute pulmonary embolism without acute cor pulmonale (HCC) [I26.99] Chest pain, unspecified type [R07.9] Patient Active Problem List   Diagnosis Date Noted   Acute pulmonary embolism ([place]) 12/03/2021   DVT of lower extremity, bilateral ([place]) 12/03/2021   Dyslipidemia 12/03/2021   Peripheral neuropathy 12/03/2021   Essential hypertension 12/03/2021   DVT (deep venous thrombosis) ([place]) 11/25/2021   CVA (cerebral vascular accident) ([place]) 11/24/2021   Normocytic anemia 11/24/2021   Abnormal chest x-ray 11/24/2021   Lumbar radiculopathy 11/24/2021   Hypokalemia 09/16/2021   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

## 2021-12-04 NOTE — Consult Note (Signed)
ANTICOAGULATION CONSULT NOTE  Pharmacy Consult for Heparin Indication: pulmonary embolus  Allergies  Allergen Reactions   Atorvastatin Other (See Comments)    Patient Measurements: Height: '5\' 6"'$  (167.6 cm) Weight: 82.8 kg (182 lb 8 oz) IBW/kg (Calculated) : 63.8 Heparin Dosing Weight: 81.7 kg  Vital Signs: Temp: 98.8 F (37.1 C) (08/17 2305) BP: 161/76 (08/17 2305) Pulse Rate: 62 (08/17 2305)  Labs: Recent Labs    12/03/21 0506 12/03/21 0928 12/03/21 1640 12/04/21 0003  HGB 9.8*  --  9.2*  --   HCT 32.3*  --  30.2*  --   PLT 269  --  257  --   HEPARINUNFRC  --   --   --  0.70  CREATININE 1.34*  --  1.21  --   TROPONINIHS 11 5  --   --      Estimated Creatinine Clearance: 49.2 mL/min (by C-G formula based on SCr of 1.21 mg/dL).   Medical History: Past Medical History:  Diagnosis Date   Arthritis    Lower Back, Hips, and Hands.   Cancer [facility])    Prostate CA s/o Rad Tx   Chronic kidney disease    Coronary artery disease    Hypercholesterolemia    Hypertension    Radiation 09/30/2008-12/02/2010   7800 cGy  Dr.Ottelin   Stroke [facility])     Medications:  No history of chronic anticoagulant use PTA **of note, was to start Apixaban on 8/22**  Assessment: Pharmacy has been consulted to initiate and monitor heparin in 81yo male who was discharged on 8/16 after having a day hospitalized for CVA complicated with DVT and given petechial hemorrhagic transformation s/p IVC filter given brief contraindication to anticoagulation done.  Goal of Therapy:  Heparin level 0.3-0.7 units/ml Monitor platelets by anticoagulation protocol: Yes   Plan:  8/18: HL @ 0003 = 0.7, therapeutic X 1  Will continue pt on current rate and recheck HL on 8/18 @ 0800.   Ritu Gagliardo D 12/04/2021,12:44 AM

## 2021-12-04 NOTE — Consult Note (Signed)
ANTICOAGULATION CONSULT NOTE  Pharmacy Consult for Heparin Indication: pulmonary embolus  Allergies  Allergen Reactions   Atorvastatin Other (See Comments)    Patient Measurements: Height: '5\' 6"'$  (167.6 cm) Weight: 82.8 kg (182 lb 8 oz) IBW/kg (Calculated) : 63.8 Heparin Dosing Weight: 81.7 kg  Vital Signs: Temp: 98.3 F (36.8 C) (08/18 0830) Temp Source: Oral (08/18 0830) BP: 157/85 (08/18 0830) Pulse Rate: 58 (08/18 0830)  Labs: Recent Labs    12/03/21 0506 12/03/21 0928 12/03/21 1640 12/04/21 0003 12/04/21 0755  HGB 9.8*  --  9.2*  --  9.1*  HCT 32.3*  --  30.2*  --  29.8*  PLT 269  --  257  --  286  HEPARINUNFRC  --   --   --  0.70 1.07*  CREATININE 1.34*  --  1.21  --  1.14  TROPONINIHS 11 5  --   --   --      Estimated Creatinine Clearance: 52.2 mL/min (by C-G formula based on SCr of 1.14 mg/dL).   Medical History: Past Medical History:  Diagnosis Date   Arthritis    Lower Back, Hips, and Hands.   Cancer [facility])    Prostate CA s/o Rad Tx   Chronic kidney disease    Coronary artery disease    Hypercholesterolemia    Hypertension    Radiation 09/30/2008-12/02/2010   7800 cGy  Dr.Ottelin   Stroke [facility])     Medications:  No history of chronic anticoagulant use PTA **of note, was to start Apixaban on 8/22**  Assessment: Pharmacy has been consulted to initiate and monitor heparin in 81yo male who was discharged on 8/16 after having a day hospitalized for CVA complicated with DVT and given petechial hemorrhagic transformation s/p IVC filter given brief contraindication to anticoagulation done.  Goal of Therapy:  Heparin level 0.3-0.7 units/ml Monitor platelets by anticoagulation protocol: Yes   Date/Time aPTT/HL Comments 8/18'@0003'$  HL = 0.7 Therapeutic x1 (unsure if held x 2 hrs?) 8/18'@0755'$  HL 1.07 Supra-therapeutic, hold x 1hrs   Plan:  HOLD heparin infusion x 1 hr Decrease rate to 1200 un/hr starting at 10am Repeat HL 8 hrs after  re-start Continue to monitor  CBC daily  Ersa Delaney Rodriguez-Guzman PharmD, BCPS 12/04/2021 8:50 AM

## 2021-12-04 NOTE — Progress Notes (Signed)
PROGRESS NOTE    Mark [name]  YKD:[id number] DOB: Jul 21, 1940 DOA: 12/03/2021 PCP: Mark Prose, MD     Brief Narrative:   81 [name].o. BM PMHx CAD, HTN, HLD,and CVA, was discharged on 8/16 after having a day hospitalization for CVA complicated with DVT and given petechial hemorrhagic transformation the patient was placed on IVC filter given brief contraindication to anticoagulation done.  Presents today with right leg pain without significant swelling and mild dyspnea as well as mild chest pain that started earlier this morning when he woke up.  He denied any palpitations.  No nausea or vomiting or abdominal pain.  No cough or wheezing or hemoptysis.  No fever or chills.  No dysuria or or hematuria or flank pain.  No other bleeding diathesis.   ED Course: Upon presentation to the emergency room, vital signs were within normal.  Labs revealed a BUN of 24 with a creatinine 1.32 slightly higher than previous levels but pretty close.  CBC showed hemoglobin 9 point hematocrit 30.4 better than previous levels. EKG as reviewed by me :  EKG showed likely atrial fibrillation with controlled ventricular sponsor of 64 with poor R wave progression and LVH by voltage criteria. Imaging: Two-view chest x-ray showed low volume with basilar atelectasis.  Chest CTA revealed small burden of weblike nonocclusive clot at the right upper lobe, pulmonary artery bifurcation without evidence of right heart strain.  It showed trace bilateral pleural effusions and central bronchial wall thickening which may reflect the bronchitis and it showed aortic atherosclerosis.  Noncontrast head CT scan showed no acute intra abnormalities.  It showed interval evolution of the left PCA territory infarction without evidence for acute hemorrhage.  It showed atrophy with chronic small vessel ischemic disease.   Contact was made with Dr. Su Monks who was agreeable with starting anticoagulation with IV heparin drip without bolus.  The  patient will be admitted to a PCU bed for further evaluation and management.   Subjective: 8/18 A/O x4 though seems somewhat confused.  States to his knowledge no one in his family has clotting issues.  Negative long airline flight or car drive.  Negative recent injury.  Was never able to schedule establish care with hematologist/oncologist given his back to back clotting issues.  States after first DVT began to use walker at home.   Assessment & Plan: Covid vaccination;   Principal Problem:   Acute pulmonary embolism ([place]) Active Problems:   DVT of lower extremity, bilateral (HCC)   Dyslipidemia   Peripheral neuropathy   Essential hypertension  Acute pulmonary embolism (HCC) - We will place the patient on IV heparin drip without bolus per protocol. - We will monitor him neurologically while on anticoagulation. - With neurological stability I expect he will be able to transition to Eliquis during this hospitalization. -8/18 per TOC patient purchased Eliquis 8/16 which would indicate patient was on anticoagulation prior to admission. - 8/18 consult Hematology in the a.m. (DOAC failure?).  Will need hematology follow-up at discharge also.   DVT of lower extremity, bilateral ([place]) - The patient had an IVC filter during last admission. - She will be placed on IV heparin as mentioned above. -8/18 consult TOC: Cheapest DOAC   Essential hypertension - We will continue his antihypertensives.   Peripheral neuropathy - We will continue Neurontin   Dyslipidemia - We will continue statin therapy.     Mobility Assessment (last 72 hours)     Mobility Assessment     Row Name 12/03/21  1950 12/03/21 1900 12/03/21 1300       Does patient have an order for bedrest or is patient medically unstable No - Continue assessment No - Continue assessment No - Continue assessment     What is the highest level of mobility based on the progressive mobility assessment? Level 3 (Stands with assist) -  Balance while standing  and cannot march in place Level 5 (Walks with assist in room/hall) - Balance while stepping forward/back and can walk in room with assist - Complete Level 5 (Walks with assist in room/hall) - Balance while stepping forward/back and can walk in room with assist - Complete     Is the above level different from baseline mobility prior to current illness? -- Yes - Recommend PT order --                   DVT prophylaxis: Heparin drip Code Status: Full Family Communication:  Status is: Inpatient    Dispo: The patient is from: Home              Anticipated d/c is to: Home              Anticipated d/c date is: 3 days              Patient currently is not medically stable to d/c.      Consultants:    Procedures/Significant Events:    I have personally reviewed and interpreted all radiology studies and my findings are as above.  VENTILATOR SETTINGS:    Cultures   Antimicrobials:    Devices    LINES / TUBES:      Continuous Infusions:  sodium chloride 100 mL/hr at 12/04/21 0600   heparin 1,400 Units/hr (12/04/21 0600)     Objective: Vitals:   12/03/21 1854 12/03/21 2011 12/03/21 2305 12/04/21 0314  BP: 133/78 (!) 143/73 (!) 161/76 (!) 149/78  Pulse: 64 (!) 59 62 (!) 58  Resp: '19 18 18 18  '$ Temp: 98.2 F (36.8 C) 98.8 F (37.1 C) 98.8 F (37.1 C) 98.3 F (36.8 C)  TempSrc:      SpO2: 98% 100% 99% 99%  Weight: 82.8 kg     Height: '5\' 6"'$  (1.676 m)       Intake/Output Summary (Last 24 hours) at 12/04/2021 0722 Last data filed at 12/04/2021 0600 Gross per 24 hour  Intake 1463.88 ml  Output 1300 ml  Net 163.88 ml   Filed Weights   12/03/21 1854  Weight: 82.8 kg    Examination:  General: A/O x4, although seems mildly confused.  No acute respiratory distress Eyes: negative scleral hemorrhage, negative anisocoria, negative icterus ENT: Negative Runny nose, negative gingival bleeding, Neck:  Negative scars, masses,  torticollis, lymphadenopathy, JVD Lungs: Clear to auscultation bilaterally without wheezes or crackles Cardiovascular: Regular rate and rhythm without murmur gallop or rub normal S1 and S2 Abdomen: negative abdominal pain, nondistended, positive soft, bowel sounds, no rebound, no ascites, no appreciable mass Extremities: No significant cyanosis, clubbing, or edema bilateral lower extremities Skin: Negative rashes, lesions, ulcers Psychiatric:  Negative depression, negative anxiety, negative fatigue, negative mania  Central nervous system:  Cranial nerves II through XII intact, tongue/uvula midline, all extremities muscle strength 5/5, sensation intact throughout, negative dysarthria, negative expressive aphasia, negative receptive aphasia.  .     Data Reviewed: Care during the described time interval was provided by me .  I have reviewed this patient's available data, including medical history, events of note, physical  examination, and all test results as part of my evaluation.  CBC: Recent Labs  Lab 11/28/21 0558 11/29/21 0421 12/03/21 0506 12/03/21 1640  WBC 6.2 7.6 5.8 6.2  NEUTROABS  --   --  3.5  --   HGB 8.7* 9.2* 9.8* 9.2*  HCT 28.0* 30.4* 32.3* 30.2*  MCV 90.3 91.3 93.4 92.4  PLT 244 260 269 916   Basic Metabolic Panel: Recent Labs  Lab 11/28/21 0558 11/29/21 0421 12/03/21 0506 12/03/21 1640  NA 138 140 140  --   K 4.3 4.4 4.1  --   CL 109 110 111  --   CO2 '25 27 23  '$ --   GLUCOSE 86 91 93  --   BUN 20 24* 25*  --   CREATININE 1.21 1.32* 1.34* 1.21  CALCIUM 8.5* 8.4* 8.5*  --   MG 2.1 2.1  --   --    GFR: Estimated Creatinine Clearance: 49.2 mL/min (by C-G formula based on SCr of 1.21 mg/dL). Liver Function Tests: No results for input(s): "AST", "ALT", "ALKPHOS", "BILITOT", "PROT", "ALBUMIN" in the last 168 hours. No results for input(s): "LIPASE", "AMYLASE" in the last 168 hours. No results for input(s): "AMMONIA" in the last 168 hours. Coagulation  Profile: No results for input(s): "INR", "PROTIME" in the last 168 hours. Cardiac Enzymes: No results for input(s): "CKTOTAL", "CKMB", "CKMBINDEX", "TROPONINI" in the last 168 hours. BNP (last 3 results) No results for input(s): "PROBNP" in the last 8760 hours. HbA1C: No results for input(s): "HGBA1C" in the last 72 hours. CBG: No results for input(s): "GLUCAP" in the last 168 hours. Lipid Profile: No results for input(s): "CHOL", "HDL", "LDLCALC", "TRIG", "CHOLHDL", "LDLDIRECT" in the last 72 hours. Thyroid Function Tests: No results for input(s): "TSH", "T4TOTAL", "FREET4", "T3FREE", "THYROIDAB" in the last 72 hours. Anemia Panel: No results for input(s): "VITAMINB12", "FOLATE", "FERRITIN", "TIBC", "IRON", "RETICCTPCT" in the last 72 hours. Sepsis Labs: No results for input(s): "PROCALCITON", "LATICACIDVEN" in the last 168 hours.  Recent Results (from the past 240 hour(s))  Culture, blood (Routine X 2) w Reflex to ID Panel     Status: None   Collection Time: 11/24/21  9:34 PM   Specimen: BLOOD  Result Value Ref Range Status   Specimen Description BLOOD LEFT ASSIST CONTROL  Final   Special Requests   Final    BOTTLES DRAWN AEROBIC AND ANAEROBIC Blood Culture adequate volume   Culture   Final    NO GROWTH 5 DAYS Performed at [facility], [address], [place], [place] [address]    Report Status 11/29/2021 FINAL  Final  Culture, blood (Routine X 2) w Reflex to ID Panel     Status: None   Collection Time: 11/24/21  9:40 PM   Specimen: BLOOD  Result Value Ref Range Status   Specimen Description BLOOD LEFT HAND  Final   Special Requests   Final    BOTTLES DRAWN AEROBIC AND ANAEROBIC Blood Culture adequate volume   Culture   Final    NO GROWTH 5 DAYS Performed at [facility], [address], [place], [place] [address]    Report Status 11/29/2021 FINAL  Final         Radiology Studies: CT Angio Chest PE W and/or Wo Contrast  Result Date:  12/03/2021 CLINICAL DATA:  Chest pain and altered mental status. History of DVT EXAM: CT ANGIOGRAPHY CHEST WITH CONTRAST TECHNIQUE: Multidetector CT imaging of the chest was performed using the standard protocol during bolus administration  of intravenous contrast. Multiplanar CT image reconstructions and MIPs were obtained to evaluate the vascular anatomy. RADIATION DOSE REDUCTION: This exam was performed according to the departmental dose-optimization program which includes automated exposure control, adjustment of the mA and/or kV according to patient size and/or use of iterative reconstruction technique. CONTRAST:  66m OMNIPAQUE IOHEXOL 350 MG/ML SOLN COMPARISON:  CT chest 09/15/2018 FINDINGS: Cardiovascular: There is adequate opacification of the pulmonary arteries to the segmental level. There is web-like nonocclusive clot at the bifurcation of the right upper lobe pulmonary artery (9-134). No other pulmonary emboli are identified. There is no convincing evidence of right heart strain. The main pulmonary artery is not dilated. The heart size is mildly enlarged. There is no pericardial effusion. There is coronary artery disease and calcified atherosclerotic plaque of the thoracic aorta. Mediastinum/Nodes: The thyroid is unremarkable. The esophagus is grossly unremarkable. There is no mediastinal, hilar, or axillary lymphadenopathy. Lungs/Pleura: The trachea and central airways are patent. There is prominent bilateral bronchial wall thickening. There are trace bilateral pleural effusions with adjacent opacities likely reflecting atelectasis. There is no focal consolidation. There is no pulmonary edema. There is no pneumothorax There are no suspicious nodules. Upper Abdomen: Bilateral renal cysts are noted. The imaged portions of the upper abdominal viscera are otherwise unremarkable. Musculoskeletal: There is no acute osseous abnormality or suspicious osseous lesion. Review of the MIP images confirms the above  findings. IMPRESSION: 1. Small burden of web-like nonocclusive clot at the right upper lobe pulmonary artery bifurcation without evidence of right heart strain. 2. Trace bilateral pleural effusions and central bronchial wall thickening which may reflect bronchitis. Aortic Atherosclerosis (ICD10-I70.0). These results were called by telephone at the time of interpretation on 12/03/2021 at 11:44 am to provider Dr. WJacelyn Grip who verbally acknowledged these results. Electronically Signed   By: PValetta MoleM.D.   On: 12/03/2021 11:44   CT HEAD WO CONTRAST (5MM)  Result Date: 12/03/2021 CLINICAL DATA:  Mental status changes. EXAM: CT HEAD WITHOUT CONTRAST TECHNIQUE: Contiguous axial images were obtained from the base of the skull through the vertex without intravenous contrast. RADIATION DOSE REDUCTION: This exam was performed according to the departmental dose-optimization program which includes automated exposure control, adjustment of the mA and/or kV according to patient size and/or use of iterative reconstruction technique. COMPARISON:  11/26/2021. FINDINGS: Brain: There is no evidence for acute hemorrhage, hydrocephalus, mass lesion, or abnormal extra-axial fluid collection. No definite CT evidence for acute infarction. 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 Diffuse loss of parenchymal volume is consistent with atrophy. Patchy low attenuation in the deep hemispheric and periventricular white matter is nonspecific, but likely reflects chronic microvascular ischemic demyelination. Vascular: No hyperdense vessel or unexpected calcification. Skull: No evidence for fracture. No worrisome lytic or sclerotic lesion. Sinuses/Orbits: The visualized paranasal sinuses and mastoid air cells are clear. Visualized portions of the globes and intraorbital fat are unremarkable. Other: None. IMPRESSION: 1. No acute intracranial abnormality. 2. Interval evolution of left PCA territory  infarct without evidence for acute hemorrhage. 3. Atrophy with chronic small vessel ischemic disease. Electronically Signed   By: EMisty StanleyM.D.   On: 12/03/2021 06:07   DG Chest 2 View  Result Date: 12/03/2021 CLINICAL DATA:  Chest pain. EXAM: CHEST - 2 VIEW COMPARISON:  11/24/2021 FINDINGS: 0514 hours. Low volume film. Cardiopericardial silhouette is at upper limits of normal for size. Basilar atelectasis evident. No edema or focal airspace consolidation. No substantial pleural effusion. Telemetry leads overlie  the chest. IMPRESSION: Low volume film with basilar atelectasis. Electronically Signed   By: Misty Stanley M.D.   On: 12/03/2021 06:04   DG Hip Unilat W or Wo Pelvis 2-3 Views Right  Result Date: 12/03/2021 CLINICAL DATA:  Right leg pain. EXAM: DG HIP (WITH OR WITHOUT PELVIS) 2-3V RIGHT COMPARISON:  None Available. FINDINGS: No evidence for acute fracture. SI joints and symphysis pubis unremarkable. Patient is status post left total hip replacement, incompletely visualize femoral component. AP and frog-leg lateral views of the right hip show no evidence for femoral neck fracture. Degenerative spurring noted in the acetabulum and femoral head. IMPRESSION: Degenerative changes in the right hip without acute bony findings. Electronically Signed   By: Misty Stanley M.D.   On: 12/03/2021 06:03        Scheduled Meds: Continuous Infusions:  sodium chloride 100 mL/hr at 12/04/21 0600   heparin 1,400 Units/hr (12/04/21 0600)     LOS: 1 day    Time spent:40 min    Brinley Rosete, Geraldo Docker, MD [facility]   If 7PM-7AM, please contact night-coverage 12/04/2021, 7:22 AM

## 2021-12-04 NOTE — Progress Notes (Signed)
   12/04/21 1000  Clinical Encounter Type  Visited With Patient  Visit Type Initial  Referral From Nurse  Consult/Referral To Chaplain   Chaplain visited with patient. Patient was sleepy but appreciated visit. Chaplain will stop back this afternoon as requested.

## 2021-12-04 NOTE — Consult Note (Addendum)
ANTICOAGULATION CONSULT NOTE  Pharmacy Consult for Heparin Indication: pulmonary embolus  Allergies  Allergen Reactions   Atorvastatin Other (See Comments)    Patient Measurements: Height: '5\' 6"'$  (167.6 cm) Weight: 82.8 kg (182 lb 8 oz) IBW/kg (Calculated) : 63.8 Heparin Dosing Weight: 81.7 kg  Vital Signs: Temp: 98.3 F (36.8 C) (08/18 0830) Temp Source: Oral (08/18 0830) BP: 157/85 (08/18 0830) Pulse Rate: 58 (08/18 0830)  Labs: Recent Labs    12/03/21 0506 12/03/21 0928 12/03/21 1640 12/04/21 0003 12/04/21 0755 12/04/21 1819  HGB 9.8*  --  9.2*  --  9.1*  --   HCT 32.3*  --  30.2*  --  29.8*  --   PLT 269  --  257  --  286  --   HEPARINUNFRC  --   --   --  0.70 1.07* 0.85*  CREATININE 1.34*  --  1.21  --  1.14  --   TROPONINIHS 11 5  --   --   --   --      Estimated Creatinine Clearance: 52.2 mL/min (by C-G formula based on SCr of 1.14 mg/dL).   Medical History: Past Medical History:  Diagnosis Date   Arthritis    Lower Back, Hips, and Hands.   Cancer [facility])    Prostate CA s/o Rad Tx   Chronic kidney disease    Coronary artery disease    Hypercholesterolemia    Hypertension    Radiation 09/30/2008-12/02/2010   7800 cGy  Dr.Ottelin   Stroke [facility])     Medications:  No history of chronic anticoagulant use PTA **of note, was to start Apixaban on 8/22**  Assessment: Pharmacy has been consulted to initiate and monitor heparin in 81yo male who was discharged on 8/16 after having a day hospitalized for CVA complicated with DVT and given petechial hemorrhagic transformation s/p IVC filter given brief contraindication to anticoagulation done.  Goal of Therapy:  Heparin level 0.3-0.7 units/ml Monitor platelets by anticoagulation protocol: Yes   Date/Time aPTT/HL Comments 8/18'@0003'$  HL = 0.7 Therapeutic x1 (unsure if held x 2 hrs?) 8/18'@0755'$  HL 1.07 Supra-therapeutic, hold x 1hrs 8/18'@1819'$  HL 0.85 Supratherapeutic x1,   Plan:  Decrease heparin infusion  to 1000 units/hr Check anti-Xa level in 8 hours and daily once consecutively therapeutic. Continue to monitor H&H and platelets daily while on heparin gtt.  [place] Pharmacist 12/04/2021 8:03 PM

## 2021-12-05 DIAGNOSIS — R079 Chest pain, unspecified: Secondary | ICD-10-CM | POA: Diagnosis not present

## 2021-12-05 DIAGNOSIS — E785 Hyperlipidemia, unspecified: Secondary | ICD-10-CM | POA: Diagnosis not present

## 2021-12-05 DIAGNOSIS — I2699 Other pulmonary embolism without acute cor pulmonale: Secondary | ICD-10-CM | POA: Diagnosis not present

## 2021-12-05 DIAGNOSIS — I82403 Acute embolism and thrombosis of unspecified deep veins of lower extremity, bilateral: Secondary | ICD-10-CM | POA: Diagnosis not present

## 2021-12-05 LAB — COMPREHENSIVE METABOLIC PANEL
ALT: 33 U/L (ref 0–44)
AST: 46 U/L — ABNORMAL HIGH (ref 15–41)
Albumin: 2.9 g/dL — ABNORMAL LOW (ref 3.5–5.0)
Alkaline Phosphatase: 52 U/L (ref 38–126)
Anion gap: 5 (ref 5–15)
BUN: 18 mg/dL (ref 8–23)
CO2: 23 mmol/L (ref 22–32)
Calcium: 8.7 mg/dL — ABNORMAL LOW (ref 8.9–10.3)
Chloride: 111 mmol/L (ref 98–111)
Creatinine, Ser: 1.12 mg/dL (ref 0.61–1.24)
GFR, Estimated: >60 mL/min (ref >60)
Glucose, Bld: 87 mg/dL (ref 70–99)
Potassium: 4.3 mmol/L (ref 3.5–5.1)
Sodium: 139 mmol/L (ref 135–145)
Total Bilirubin: 0.7 mg/dL (ref 0.3–1.2)
Total Protein: 6.5 g/dL (ref 6.5–8.1)

## 2021-12-05 LAB — CBC WITH DIFFERENTIAL/PLATELET
Abs Immature Granulocytes: 0.06 10*3/uL (ref 0.00–0.07)
Basophils Absolute: 0.1 10*3/uL (ref 0.0–0.1)
Basophils Relative: 1 %
Eosinophils Absolute: 0.2 10*3/uL (ref 0.0–0.5)
Eosinophils Relative: 3 %
HCT: 31 % — ABNORMAL LOW (ref 39.0–52.0)
Hemoglobin: 9.5 g/dL — ABNORMAL LOW (ref 13.0–17.0)
Immature Granulocytes: 1 %
Lymphocytes Relative: 33 %
Lymphs Abs: 2.5 10*3/uL (ref 0.7–4.0)
MCH: 27.6 pg (ref 26.0–34.0)
MCHC: 30.6 g/dL (ref 30.0–36.0)
MCV: 90.1 fL (ref 80.0–100.0)
Monocytes Absolute: 0.7 10*3/uL (ref 0.1–1.0)
Monocytes Relative: 8 %
Neutro Abs: 4.3 10*3/uL (ref 1.7–7.7)
Neutrophils Relative %: 54 %
Platelets: 288 10*3/uL (ref 150–400)
RBC: 3.44 MIL/uL — ABNORMAL LOW (ref 4.22–5.81)
RDW: 13.7 % (ref 11.5–15.5)
WBC: 7.8 10*3/uL (ref 4.0–10.5)
nRBC: 0 % (ref 0.0–0.2)

## 2021-12-05 LAB — MAGNESIUM: Magnesium: 2 mg/dL (ref 1.7–2.4)

## 2021-12-05 LAB — PHOSPHORUS: Phosphorus: 3.5 mg/dL (ref 2.5–4.6)

## 2021-12-05 LAB — HEPARIN LEVEL (UNFRACTIONATED): Heparin Unfractionated: 0.51 IU/mL (ref 0.30–0.70)

## 2021-12-05 MED ORDER — GABAPENTIN 100 MG PO CAPS
100.0000 mg | ORAL_CAPSULE | Freq: Every day | ORAL | Status: DC
  Administered 2021-12-05 – 2021-12-07 (×3): 100 mg via ORAL
  Filled 2021-12-05 (×3): qty 1

## 2021-12-05 MED ORDER — ROSUVASTATIN CALCIUM 20 MG PO TABS
40.0000 mg | ORAL_TABLET | Freq: Every day | ORAL | Status: DC
  Administered 2021-12-06 – 2021-12-08 (×3): 40 mg via ORAL
  Filled 2021-12-05: qty 2
  Filled 2021-12-05: qty 4
  Filled 2021-12-05: qty 2

## 2021-12-05 MED ORDER — APIXABAN 5 MG PO TABS: 5.0000 mg | ORAL_TABLET | Freq: Two times a day (BID) | ORAL | Status: DC

## 2021-12-05 MED ORDER — APIXABAN 5 MG PO TABS
10.0000 mg | ORAL_TABLET | Freq: Two times a day (BID) | ORAL | Status: DC
  Administered 2021-12-05 – 2021-12-08 (×7): 10 mg via ORAL
  Filled 2021-12-05 (×7): qty 2

## 2021-12-05 NOTE — Consult Note (Signed)
ANTICOAGULATION CONSULT NOTE  Pharmacy Consult for Heparin Indication: pulmonary embolus  Allergies  Allergen Reactions   Atorvastatin Other (See Comments)    Patient Measurements: Height: '5\' 6"'$  (167.6 cm) Weight: 82.8 kg (182 lb 8 oz) IBW/kg (Calculated) : 63.8 Heparin Dosing Weight: 81.7 kg  Vital Signs: Temp: 98.3 F (36.8 C) (08/19 0454) Temp Source: Oral (08/18 2015) BP: 156/81 (08/19 0454) Pulse Rate: 61 (08/19 0454)  Labs: Recent Labs    12/03/21 0506 12/03/21 0928 12/03/21 1640 12/04/21 0003 12/04/21 0755 12/04/21 1819 12/05/21 0446  HGB 9.8*  --  9.2*  --  9.1*  --  9.5*  HCT 32.3*  --  30.2*  --  29.8*  --  31.0*  PLT 269  --  257  --  286  --  288  HEPARINUNFRC  --   --   --    < > 1.07* 0.85* 0.51  CREATININE 1.34*  --  1.21  --  1.14  --  1.12  TROPONINIHS 11 5  --   --   --   --   --    < > = values in this interval not displayed.     Estimated Creatinine Clearance: 53.1 mL/min (by C-G formula based on SCr of 1.12 mg/dL).   Medical History: Past Medical History:  Diagnosis Date   Arthritis    Lower Back, Hips, and Hands.   Cancer [facility])    Prostate CA s/o Rad Tx   Chronic kidney disease    Coronary artery disease    Hypercholesterolemia    Hypertension    Radiation 09/30/2008-12/02/2010   7800 cGy  Dr.Ottelin   Stroke [facility])     Medications:  No history of chronic anticoagulant use PTA **of note, was to start Apixaban on 8/22**  Assessment: Pharmacy has been consulted to initiate and monitor heparin in 81yo male who was discharged on 8/16 after having a day hospitalized for CVA complicated with DVT and given petechial hemorrhagic transformation s/p IVC filter given brief contraindication to anticoagulation done.  Goal of Therapy:  Heparin level 0.3-0.7 units/ml Monitor platelets by anticoagulation protocol: Yes   Date/Time aPTT/HL Comments 8/18'@0003'$  HL = 0.7 Therapeutic x1 (unsure if held x 2 hrs?) 8/18'@0755'$  HL  1.07 Supra-therapeutic, hold x 1hrs 8/18'@1819'$  HL 0.85 Supratherapeutic x1,  8/19'@0446'$      HL 0.51           Therapeutic X 1   Plan:  8/19 @ 0446:  HL = 0.51, therapeutic X 1 Will continue pt on current rate and recheck HL on 8/19 @ 1300.   Shani Fitch D Clinical Pharmacist 12/05/2021 6:17 AM

## 2021-12-05 NOTE — Plan of Care (Signed)

## 2021-12-05 NOTE — Progress Notes (Signed)
PROGRESS NOTE    Mark [name]  ENI:[id number] DOB: 1941-02-03 DOA: 12/03/2021 PCP: Donald Prose, MD     Brief Narrative:   81 y.o. BM PMHx CAD, HTN, HLD,and CVA (on last admission 8/8 to 8/16), was discharged on 8/16 after having a day hospitalization for CVA complicated with  near occlusive DVT with the left profundus femoral vein protruding into the common femoral veins.  Occlusive DVT visualized in posterior tibial and peroneal veins of the lower extremities bilaterally. Given petechial hemorrhagic transformation of CVA, IVC filter placed by Dr. Lucky Cowboy on 8/9, given brief contraindication to anticoagulation per neurology (unable to be placed on full anticoagulation for 2 weeks postdischarge 8/16).  Eliquis was not to be started until 12/08/2021 for discharge summary. Per discharge summary 8/16 IVC filter to be removed once patient can tolerate full dose anticoagulant (Eliquis)  Presents today with right leg pain without significant swelling and mild dyspnea as well as mild chest pain that started earlier this morning when he woke up.  He denied any palpitations.  No nausea or vomiting or abdominal pain.  No cough or wheezing or hemoptysis.  No fever or chills.  No dysuria or or hematuria or flank pain.  No other bleeding diathesis.   ED Course: Upon presentation to the emergency room, vital signs were within normal.  Labs revealed a BUN of 24 with a creatinine 1.32 slightly higher than previous levels but pretty close.  CBC showed hemoglobin 9 point hematocrit 30.4 better than previous levels. EKG as reviewed by me :  EKG showed likely atrial fibrillation with controlled ventricular sponsor of 64 with poor R wave progression and LVH by voltage criteria. Imaging: Two-view chest x-ray showed low volume with basilar atelectasis.  Chest CTA revealed small burden of weblike nonocclusive clot at the right upper lobe, pulmonary artery bifurcation without evidence of right heart strain.  It showed trace  bilateral pleural effusions and central bronchial wall thickening which may reflect the bronchitis and it showed aortic atherosclerosis.  Noncontrast head CT scan showed no acute intra abnormalities.  It showed interval evolution of the left PCA territory infarction without evidence for acute hemorrhage.  It showed atrophy with chronic small vessel ischemic disease.   Contact was made with Dr. Su Monks who was agreeable with starting anticoagulation with IV heparin drip without bolus.  The patient will be admitted to a PCU bed for further evaluation and management.   Subjective: 8/19 A/O x4 sitting in chair comfortably.  Wife states that patient having difficulty ambulating since his stroke.   Assessment & Plan: Covid vaccination;   Principal Problem:   Acute pulmonary embolism ([place]) Active Problems:   DVT of lower extremity, bilateral (HCC)   Dyslipidemia   Peripheral neuropathy   Essential hypertension  Acute pulmonary embolism (HCC) - We will place the patient on IV heparin drip without bolus per protocol. - We will monitor him neurologically while on anticoagulation. - With neurological stability I expect he will be able to transition to Eliquis during this hospitalization. -8/18 per TOC patient purchased Eliquis 8/16 which would indicate patient was on anticoagulation prior to admission. - 8/18 consult Hematology in the a.m. (DOAC failure?).  Will need hematology follow-up at discharge also. -8/19 after further review of patient's chart he never actually started DOAC before being readmitted for acute PE.  Therefore HE IS NOT DOAC failure. - 8/19 discussed case with Dr. Su Monks who concurred okay to transition patient from heparin drip to Eliquis -8/19 prior  to discharge patient will require establish care appointment with Hematologist for his acute DVT/PE   DVT of lower extremity, bilateral ([place]) - The patient had an IVC filter during last admission. - She will be  placed on IV heparin as mentioned above. -8/19 pharmacy will transition patient from heparin to Eliquis starting today   CVA - Recently hospitalized 8/8 to 8/16 for CVA. -MRI showing a new interval infarct in left occipital lobe which is likely subacute in chronicity and associated with cortical laminar necrosis and/or petechial hemorrhage -At that time neurology recommended  not giving full anticoagulation for 2 weeks (NOTE also diagnosed with DVT during that hospitalization)   Essential hypertension - We will continue his antihypertensives.   Peripheral neuropathy -Neurontin 100 mg qhs   Dyslipidemia -Crestor 40 mg daily     Mobility Assessment (last 72 hours)     Mobility Assessment     Row Name 12/05/21 0900 12/04/21 2015 12/03/21 1950 12/03/21 1900 12/03/21 1300   Does patient have an order for bedrest or is patient medically unstable No - Continue assessment No - Continue assessment No - Continue assessment No - Continue assessment No - Continue assessment   What is the highest level of mobility based on the progressive mobility assessment? Level 5 (Walks with assist in room/hall) - Balance while stepping forward/back and can walk in room with assist - Complete Level 4 (Walks with assist in room) - Balance while marching in place and cannot step forward and back - Complete Level 3 (Stands with assist) - Balance while standing  and cannot march in place Level 5 (Walks with assist in room/hall) - Balance while stepping forward/back and can walk in room with assist - Complete Level 5 (Walks with assist in room/hall) - Balance while stepping forward/back and can walk in room with assist - Complete   Is the above level different from baseline mobility prior to current illness? Yes - Recommend PT order Yes - Recommend PT order -- Yes - Recommend PT order --           Goals of care - 8/19 PT/OT/TOC consult:Wife states that patient having difficulty ambulating since his stroke.   Evaluate for CIR vs SNF, given patient's back-to-back hospitalizations believe would benefit strongly      DVT prophylaxis: Heparin drip Code Status: Full Family Communication: 8/19 wife at bedside for discussion of plan of care all questions answered Status is: Inpatient    Dispo: The patient is from: Home              Anticipated d/c is to: Home              Anticipated d/c date is: 3 days              Patient currently is not medically stable to d/c.      Consultants:    Procedures/Significant Events:    I have personally reviewed and interpreted all radiology studies and Mark findings are as above.  VENTILATOR SETTINGS:    Cultures   Antimicrobials:    Devices    LINES / TUBES:      Continuous Infusions:  sodium chloride 100 mL/hr at 12/04/21 0600   heparin 1,000 Units/hr (12/05/21 0626)     Objective: Vitals:   12/04/21 2009 12/04/21 2015 12/05/21 0454 12/05/21 0820  BP: (!) 154/70  (!) 156/81 (!) 157/78  Pulse: 61  61 (!) 57  Resp: '18 20 18   '$ Temp: (!) 95.5 F (35.3  C) 98.6 F (37 C) 98.3 F (36.8 C)   TempSrc:  Oral    SpO2: 98%  98%   Weight:      Height:        Intake/Output Summary (Last 24 hours) at 12/05/2021 1004 Last data filed at 12/05/2021 0626 Gross per 24 hour  Intake 664.57 ml  Output --  Net 664.57 ml    Filed Weights   12/03/21 1854  Weight: 82.8 kg    Examination:  General: A/O x4, although seems mildly confused.  No acute respiratory distress Eyes: negative scleral hemorrhage, negative anisocoria, negative icterus ENT: Negative Runny nose, negative gingival bleeding, Neck:  Negative scars, masses, torticollis, lymphadenopathy, JVD Lungs: Clear to auscultation bilaterally without wheezes or crackles Cardiovascular: Regular rate and rhythm without murmur gallop or rub normal S1 and S2 Abdomen: negative abdominal pain, nondistended, positive soft, bowel sounds, no rebound, no ascites, no appreciable  mass Extremities: No significant cyanosis, clubbing, or edema bilateral lower extremities Skin: Negative rashes, lesions, ulcers Psychiatric:  Negative depression, negative anxiety, negative fatigue, negative mania  Central nervous system:  Cranial nerves II through XII intact, tongue/uvula midline, all extremities muscle strength 5/5, sensation intact throughout, negative dysarthria, negative expressive aphasia, negative receptive aphasia.  .     Data Reviewed: Care during the described time interval was provided by me .  I have reviewed this patient's available data, including medical history, events of note, physical examination, and all test results as part of Mark evaluation.  CBC: Recent Labs  Lab 11/29/21 0421 12/03/21 0506 12/03/21 1640 12/04/21 0755 12/05/21 0446  WBC 7.6 5.8 6.2 6.9 7.8  NEUTROABS  --  3.5  --  4.4 4.3  HGB 9.2* 9.8* 9.2* 9.1* 9.5*  HCT 30.4* 32.3* 30.2* 29.8* 31.0*  MCV 91.3 93.4 92.4 91.1 90.1  PLT 260 269 257 286 465    Basic Metabolic Panel: Recent Labs  Lab 11/29/21 0421 12/03/21 0506 12/03/21 1640 12/04/21 0755 12/05/21 0446  NA 140 140  --  139 139  K 4.4 4.1  --  4.3 4.3  CL 110 111  --  113* 111  CO2 27 23  --  23 23  GLUCOSE 91 93  --  92 87  BUN 24* 25*  --  18 18  CREATININE 1.32* 1.34* 1.21 1.14 1.12  CALCIUM 8.4* 8.5*  --  8.6* 8.7*  MG 2.1  --   --  2.0 2.0  PHOS  --   --   --  4.0 3.5    GFR: Estimated Creatinine Clearance: 53.1 mL/min (by C-G formula based on SCr of 1.12 mg/dL). Liver Function Tests: Recent Labs  Lab 12/04/21 0755 12/05/21 0446  AST 30 46*  ALT 22 33  ALKPHOS 50 52  BILITOT 0.5 0.7  PROT 6.2* 6.5  ALBUMIN 2.8* 2.9*   No results for input(s): "LIPASE", "AMYLASE" in the last 168 hours. No results for input(s): "AMMONIA" in the last 168 hours. Coagulation Profile: No results for input(s): "INR", "PROTIME" in the last 168 hours. Cardiac Enzymes: No results for input(s): "CKTOTAL", "CKMB",  "CKMBINDEX", "TROPONINI" in the last 168 hours. BNP (last 3 results) No results for input(s): "PROBNP" in the last 8760 hours. HbA1C: No results for input(s): "HGBA1C" in the last 72 hours. CBG: No results for input(s): "GLUCAP" in the last 168 hours. Lipid Profile: No results for input(s): "CHOL", "HDL", "LDLCALC", "TRIG", "CHOLHDL", "LDLDIRECT" in the last 72 hours. Thyroid Function Tests: No results for input(s): "TSH", "T4TOTAL", "  FREET4", "T3FREE", "THYROIDAB" in the last 72 hours. Anemia Panel: No results for input(s): "VITAMINB12", "FOLATE", "FERRITIN", "TIBC", "IRON", "RETICCTPCT" in the last 72 hours. Sepsis Labs: No results for input(s): "PROCALCITON", "LATICACIDVEN" in the last 168 hours.  No results found for this or any previous visit (from the past 240 hour(s)).        Radiology Studies: CT Angio Chest PE W and/or Wo Contrast  Result Date: 12/03/2021 CLINICAL DATA:  Chest pain and altered mental status. History of DVT EXAM: CT ANGIOGRAPHY CHEST WITH CONTRAST TECHNIQUE: Multidetector CT imaging of the chest was performed using the standard protocol during bolus administration of intravenous contrast. Multiplanar CT image reconstructions and MIPs were obtained to evaluate the vascular anatomy. RADIATION DOSE REDUCTION: This exam was performed according to the departmental dose-optimization program which includes automated exposure control, adjustment of the mA and/or kV according to patient size and/or use of iterative reconstruction technique. CONTRAST:  73m OMNIPAQUE IOHEXOL 350 MG/ML SOLN COMPARISON:  CT chest 09/15/2018 FINDINGS: Cardiovascular: There is adequate opacification of the pulmonary arteries to the segmental level. There is web-like nonocclusive clot at the bifurcation of the right upper lobe pulmonary artery (9-134). No other pulmonary emboli are identified. There is no convincing evidence of right heart strain. The main pulmonary artery is not dilated. The  heart size is mildly enlarged. There is no pericardial effusion. There is coronary artery disease and calcified atherosclerotic plaque of the thoracic aorta. Mediastinum/Nodes: The thyroid is unremarkable. The esophagus is grossly unremarkable. There is no mediastinal, hilar, or axillary lymphadenopathy. Lungs/Pleura: The trachea and central airways are patent. There is prominent bilateral bronchial wall thickening. There are trace bilateral pleural effusions with adjacent opacities likely reflecting atelectasis. There is no focal consolidation. There is no pulmonary edema. There is no pneumothorax There are no suspicious nodules. Upper Abdomen: Bilateral renal cysts are noted. The imaged portions of the upper abdominal viscera are otherwise unremarkable. Musculoskeletal: There is no acute osseous abnormality or suspicious osseous lesion. Review of the MIP images confirms the above findings. IMPRESSION: 1. Small burden of web-like nonocclusive clot at the right upper lobe pulmonary artery bifurcation without evidence of right heart strain. 2. Trace bilateral pleural effusions and central bronchial wall thickening which may reflect bronchitis. Aortic Atherosclerosis (ICD10-I70.0). These results were called by telephone at the time of interpretation on 12/03/2021 at 11:44 am to provider Dr. WJacelyn Grip who verbally acknowledged these results. Electronically Signed   By: PValetta MoleM.D.   On: 12/03/2021 11:44        Scheduled Meds: Continuous Infusions:  sodium chloride 100 mL/hr at 12/04/21 0600   heparin 1,000 Units/hr (12/05/21 0626)     LOS: 2 days    Time spent:40 min    Clarion Mooneyhan, CGeraldo Docker MD [facility]   If 7PM-7AM, please contact night-coverage 12/05/2021, 10:04 AM

## 2021-12-05 NOTE — Consult Note (Signed)
[place] for transition from heparin infusion to apixaban Indication: pulmonary embolus and DVT  Allergies  Allergen Reactions   Atorvastatin Other (See Comments)    Patient Measurements: Height: '5\' 6"'$  (167.6 cm) Weight: 82.8 kg (182 lb 8 oz) IBW/kg (Calculated) : 63.8 Heparin Dosing Weight: 81.7 kg  Vital Signs: Temp: 98.3 F (36.8 C) (08/19 0454) BP: 136/80 (08/19 1138) Pulse Rate: 60 (08/19 1138)  Labs: Recent Labs    12/03/21 0506 12/03/21 0928 12/03/21 1640 12/04/21 0003 12/04/21 0755 12/04/21 1819 12/05/21 0446  HGB 9.8*  --  9.2*  --  9.1*  --  9.5*  HCT 32.3*  --  30.2*  --  29.8*  --  31.0*  PLT 269  --  257  --  286  --  288  HEPARINUNFRC  --   --   --    < > 1.07* 0.85* 0.51  CREATININE 1.34*  --  1.21  --  1.14  --  1.12  TROPONINIHS 11 5  --   --   --   --   --    < > = values in this interval not displayed.     Estimated Creatinine Clearance: 53.1 mL/min (by C-G formula based on SCr of 1.12 mg/dL).   Medical History: Past Medical History:  Diagnosis Date   Arthritis    Lower Back, Hips, and Hands.   Cancer [facility])    Prostate CA s/o Rad Tx   Chronic kidney disease    Coronary artery disease    Hypercholesterolemia    Hypertension    Radiation 09/30/2008-12/02/2010   7800 cGy  Dr.Ottelin   Stroke [facility])     Medications:  Scheduled:  Infusions:   sodium chloride 100 mL/hr at 12/04/21 0600   heparin 1,000 Units/hr (12/05/21 0626)   PRN: acetaminophen **OR** acetaminophen, magnesium hydroxide, ondansetron **OR** ondansetron (ZOFRAN) IV, traZODone  Assessment: Mark [name] is a 81 y.o. male with PMH significant for CAD, HTN, HLD, and CVA. Patient was recently discharged on 12/02/2021 after hospitalization x1 day for CVA complicated by bilateral lower extremity DVT. At discharge on 12/02/2021, neuro recommended holding off on anticoagulation x2 weeks (start date 12/08/2021). Upon representation, patient  has mild dyspnea and R leg pain without significant swelling. 12/03/2021 CT chest revealed web-like nonocclusive clot at the bifurcation of the right upper lobe pulmonary artery. Non-contrast CT head not concerning for acute hemorrhage. Anticoagulation was initiated with heparin infusion. Per patient's wife, apixaban was not yet started PTA. Plan to start apixaban prior to 12/08/2021 was confirmed with neuro. Pharmacy has been consulted to transition patient from heparin infusion to apixaban for treatment of DVT and PE.   Plan:  Transition from heparin infusion to apixaban 10 mg BID x7 days then 5 mg BID as clinically indicated.  Gretel Acre, PharmD PGY1 Pharmacy Resident 12/05/2021 1:44 PM

## 2021-12-06 DIAGNOSIS — R079 Chest pain, unspecified: Secondary | ICD-10-CM | POA: Diagnosis not present

## 2021-12-06 DIAGNOSIS — I2699 Other pulmonary embolism without acute cor pulmonale: Secondary | ICD-10-CM | POA: Diagnosis not present

## 2021-12-06 DIAGNOSIS — E785 Hyperlipidemia, unspecified: Secondary | ICD-10-CM | POA: Diagnosis not present

## 2021-12-06 DIAGNOSIS — I82403 Acute embolism and thrombosis of unspecified deep veins of lower extremity, bilateral: Secondary | ICD-10-CM | POA: Diagnosis not present

## 2021-12-06 LAB — COMPREHENSIVE METABOLIC PANEL
ALT: 29 U/L (ref 0–44)
AST: 36 U/L (ref 15–41)
Albumin: 2.8 g/dL — ABNORMAL LOW (ref 3.5–5.0)
Alkaline Phosphatase: 50 U/L (ref 38–126)
Anion gap: 3 — ABNORMAL LOW (ref 5–15)
BUN: 22 mg/dL (ref 8–23)
CO2: 23 mmol/L (ref 22–32)
Calcium: 8.7 mg/dL — ABNORMAL LOW (ref 8.9–10.3)
Chloride: 115 mmol/L — ABNORMAL HIGH (ref 98–111)
Creatinine, Ser: 1.14 mg/dL (ref 0.61–1.24)
GFR, Estimated: >60 mL/min (ref >60)
Glucose, Bld: 80 mg/dL (ref 70–99)
Potassium: 4.1 mmol/L (ref 3.5–5.1)
Sodium: 141 mmol/L (ref 135–145)
Total Bilirubin: 0.4 mg/dL (ref 0.3–1.2)
Total Protein: 6.3 g/dL — ABNORMAL LOW (ref 6.5–8.1)

## 2021-12-06 LAB — CBC WITH DIFFERENTIAL/PLATELET
Abs Immature Granulocytes: 0.05 10*3/uL (ref 0.00–0.07)
Basophils Absolute: 0 10*3/uL (ref 0.0–0.1)
Basophils Relative: 1 %
Eosinophils Absolute: 0.2 10*3/uL (ref 0.0–0.5)
Eosinophils Relative: 3 %
HCT: 31.1 % — ABNORMAL LOW (ref 39.0–52.0)
Hemoglobin: 9.5 g/dL — ABNORMAL LOW (ref 13.0–17.0)
Immature Granulocytes: 1 %
Lymphocytes Relative: 27 %
Lymphs Abs: 1.8 10*3/uL (ref 0.7–4.0)
MCH: 27.8 pg (ref 26.0–34.0)
MCHC: 30.5 g/dL (ref 30.0–36.0)
MCV: 90.9 fL (ref 80.0–100.0)
Monocytes Absolute: 0.6 10*3/uL (ref 0.1–1.0)
Monocytes Relative: 10 %
Neutro Abs: 3.9 10*3/uL (ref 1.7–7.7)
Neutrophils Relative %: 58 %
Platelets: 273 10*3/uL (ref 150–400)
RBC: 3.42 MIL/uL — ABNORMAL LOW (ref 4.22–5.81)
RDW: 13.6 % (ref 11.5–15.5)
WBC: 6.5 10*3/uL (ref 4.0–10.5)
nRBC: 0 % (ref 0.0–0.2)

## 2021-12-06 LAB — PHOSPHORUS: Phosphorus: 4.2 mg/dL (ref 2.5–4.6)

## 2021-12-06 LAB — MAGNESIUM: Magnesium: 2.2 mg/dL (ref 1.7–2.4)

## 2021-12-06 MED ORDER — ORAL CARE MOUTH RINSE: 15.0000 mL | OROMUCOSAL | Status: DC | PRN

## 2021-12-06 MED ORDER — POLYETHYLENE GLYCOL 3350 17 G PO PACK
17.0000 g | PACK | Freq: Every day | ORAL | Status: DC
  Administered 2021-12-06 – 2021-12-08 (×3): 17 g via ORAL
  Filled 2021-12-06 (×3): qty 1

## 2021-12-06 MED ORDER — AMLODIPINE BESYLATE 10 MG PO TABS
10.0000 mg | ORAL_TABLET | Freq: Every day | ORAL | Status: DC
  Administered 2021-12-06 – 2021-12-08 (×3): 10 mg via ORAL
  Filled 2021-12-06 (×3): qty 1

## 2021-12-06 MED ORDER — LISINOPRIL 20 MG PO TABS
40.0000 mg | ORAL_TABLET | Freq: Every day | ORAL | Status: DC
  Administered 2021-12-06 – 2021-12-08 (×3): 40 mg via ORAL
  Filled 2021-12-06 (×4): qty 2

## 2021-12-06 NOTE — Progress Notes (Signed)
PROGRESS NOTE    Mark [name]  IDP:[id number] DOB: 09-05-1940 DOA: 12/03/2021 PCP: Donald Prose, MD     Brief Narrative:   81 y.o. BM PMHx CAD, HTN, HLD,and CVA (on last admission 8/8 to 8/16), was discharged on 8/16 after having a day hospitalization for CVA complicated with  near occlusive DVT with the left profundus femoral vein protruding into the common femoral veins.  Occlusive DVT visualized in posterior tibial and peroneal veins of the lower extremities bilaterally. Given petechial hemorrhagic transformation of CVA, IVC filter placed by Dr. Lucky Cowboy on 8/9, given brief contraindication to anticoagulation per neurology (unable to be placed on full anticoagulation for 2 weeks postdischarge 8/16).  Eliquis was not to be started until 12/08/2021 for discharge summary. Per discharge summary 8/16 IVC filter to be removed once patient can tolerate full dose anticoagulant (Eliquis)  Presents today with right leg pain without significant swelling and mild dyspnea as well as mild chest pain that started earlier this morning when he woke up.  He denied any palpitations.  No nausea or vomiting or abdominal pain.  No cough or wheezing or hemoptysis.  No fever or chills.  No dysuria or or hematuria or flank pain.  No other bleeding diathesis.   ED Course: Upon presentation to the emergency room, vital signs were within normal.  Labs revealed a BUN of 24 with a creatinine 1.32 slightly higher than previous levels but pretty close.  CBC showed hemoglobin 9 point hematocrit 30.4 better than previous levels. EKG as reviewed by me :  EKG showed likely atrial fibrillation with controlled ventricular sponsor of 64 with poor R wave progression and LVH by voltage criteria. Imaging: Two-view chest x-ray showed low volume with basilar atelectasis.  Chest CTA revealed small burden of weblike nonocclusive clot at the right upper lobe, pulmonary artery bifurcation without evidence of right heart strain.  It showed trace  bilateral pleural effusions and central bronchial wall thickening which may reflect the bronchitis and it showed aortic atherosclerosis.  Noncontrast head CT scan showed no acute intra abnormalities.  It showed interval evolution of the left PCA territory infarction without evidence for acute hemorrhage.  It showed atrophy with chronic small vessel ischemic disease.   Contact was made with Dr. Su Monks who was agreeable with starting anticoagulation with IV heparin drip without bolus.  The patient will be admitted to a PCU bed for further evaluation and management.   Subjective: 8/20 afebrile overnight A/O x4 laying comfortably in bed.   Assessment & Plan: Covid vaccination;   Principal Problem:   Acute pulmonary embolism ([place]) Active Problems:   DVT of lower extremity, bilateral (HCC)   Dyslipidemia   Peripheral neuropathy   Essential hypertension  Acute pulmonary embolism (HCC) - We will place the patient on IV heparin drip without bolus per protocol. - We will monitor him neurologically while on anticoagulation. - With neurological stability I expect he will be able to transition to Eliquis during this hospitalization. -8/18 per TOC patient purchased Eliquis 8/16 which would indicate patient was on anticoagulation prior to admission. - 8/18 consult Hematology in the a.m. (DOAC failure?).  Will need hematology follow-up at discharge also. -8/19 after further review of patient's chart he never actually started DOAC before being readmitted for acute PE.  Therefore HE IS NOT DOAC failure. - 8/19 discussed case with Dr. Su Monks who concurred okay to transition patient from heparin drip to Eliquis -8/19 prior to discharge patient will require establish care appointment with  Hematologist for his acute DVT/PE   DVT of lower extremity, bilateral ([place]) - The patient had an IVC filter during last admission. - She will be placed on IV heparin as mentioned above. -8/19 pharmacy will  transition patient from heparin to Eliquis starting today   CVA - Recently hospitalized 8/8 to 8/16 for CVA. -MRI showing a new interval infarct in left occipital lobe which is likely subacute in chronicity and associated with cortical laminar necrosis and/or petechial hemorrhage -At that time neurology recommended  not giving full anticoagulation for 2 weeks (NOTE also diagnosed with DVT during that hospitalization)   Essential hypertension - We will continue his antihypertensives.   Peripheral neuropathy -Neurontin 100 mg qhs   Dyslipidemia -Crestor 40 mg daily     Mobility Assessment (last 72 hours)     Mobility Assessment     Row Name 12/06/21 1330 12/05/21 0900 12/04/21 2015 12/03/21 1950 12/03/21 1900   Does patient have an order for bedrest or is patient medically unstable -- No - Continue assessment No - Continue assessment No - Continue assessment No - Continue assessment   What is the highest level of mobility based on the progressive mobility assessment? Level 5 (Walks with assist in room/hall) - Balance while stepping forward/back and can walk in room with assist - Complete Level 5 (Walks with assist in room/hall) - Balance while stepping forward/back and can walk in room with assist - Complete Level 4 (Walks with assist in room) - Balance while marching in place and cannot step forward and back - Complete Level 3 (Stands with assist) - Balance while standing  and cannot march in place Level 5 (Walks with assist in room/hall) - Balance while stepping forward/back and can walk in room with assist - Complete   Is the above level different from baseline mobility prior to current illness? -- Yes - Recommend PT order Yes - Recommend PT order -- Yes - Recommend PT order           Goals of care - 8/19 PT/OT/TOC consult:Wife states that patient having difficulty ambulating since his stroke.  Evaluate for CIR vs SNF, given patient's back-to-back hospitalizations believe would  benefit strongly - 8/20 OT recommended CIR will place consult. Patient recommended for CIR.  Patient with stroke, DVT, PE, however highly motivated to improve his functional status would be good candidate for program.      DVT prophylaxis: Heparin drip Code Status: Full Family Communication: 8/20 wife at bedside for discussion of plan of care all questions answered Status is: Inpatient    Dispo: The patient is from: Home              Anticipated d/c is to: Home              Anticipated d/c date is: 3 days              Patient currently is not medically stable to d/c.      Consultants:    Procedures/Significant Events:    I have personally reviewed and interpreted all radiology studies and my findings are as above.  VENTILATOR SETTINGS:    Cultures   Antimicrobials:    Devices    LINES / TUBES:      Continuous Infusions:  sodium chloride 100 mL/hr at 12/04/21 0600     Objective: Vitals:   12/06/21 0350 12/06/21 0801 12/06/21 0839 12/06/21 1255  BP: (!) 163/92 (!) 165/81 (!) 156/79 132/68  Pulse: (!) 57 (!) 54 Marland Kitchen)  59   Resp: '17 19 16   '$ Temp: 97.7 F (36.5 C)  98.7 F (37.1 C) 98.6 F (37 C)  TempSrc: Oral  Oral   SpO2: 100% 100% 100%   Weight:      Height:        Intake/Output Summary (Last 24 hours) at 12/06/2021 1457 Last data filed at 12/06/2021 1300 Gross per 24 hour  Intake 240 ml  Output --  Net 240 ml    Filed Weights   12/03/21 1854  Weight: 82.8 kg    Examination:  General: A/O x4, although seems mildly confused.  No acute respiratory distress Eyes: negative scleral hemorrhage, negative anisocoria, negative icterus ENT: Negative Runny nose, negative gingival bleeding, Neck:  Negative scars, masses, torticollis, lymphadenopathy, JVD Lungs: Clear to auscultation bilaterally without wheezes or crackles Cardiovascular: Regular rate and rhythm without murmur gallop or rub normal S1 and S2 Abdomen: negative abdominal pain,  nondistended, positive soft, bowel sounds, no rebound, no ascites, no appreciable mass Extremities: No significant cyanosis, clubbing, or edema bilateral lower extremities Skin: Negative rashes, lesions, ulcers Psychiatric:  Negative depression, negative anxiety, negative fatigue, negative mania  Central nervous system:  Cranial nerves II through XII intact, tongue/uvula midline, all extremities muscle strength 5/5, sensation intact throughout, negative dysarthria, negative expressive aphasia, negative receptive aphasia.  .     Data Reviewed: Care during the described time interval was provided by me .  I have reviewed this patient's available data, including medical history, events of note, physical examination, and all test results as part of my evaluation.  CBC: Recent Labs  Lab 12/03/21 0506 12/03/21 1640 12/04/21 0755 12/05/21 0446 12/06/21 0605  WBC 5.8 6.2 6.9 7.8 6.5  NEUTROABS 3.5  --  4.4 4.3 3.9  HGB 9.8* 9.2* 9.1* 9.5* 9.5*  HCT 32.3* 30.2* 29.8* 31.0* 31.1*  MCV 93.4 92.4 91.1 90.1 90.9  PLT 269 257 286 288 440    Basic Metabolic Panel: Recent Labs  Lab 12/03/21 0506 12/03/21 1640 12/04/21 0755 12/05/21 0446 12/06/21 0605  NA 140  --  139 139 141  K 4.1  --  4.3 4.3 4.1  CL 111  --  113* 111 115*  CO2 23  --  '23 23 23  '$ GLUCOSE 93  --  92 87 80  BUN 25*  --  '18 18 22  '$ CREATININE 1.34* 1.21 1.14 1.12 1.14  CALCIUM 8.5*  --  8.6* 8.7* 8.7*  MG  --   --  2.0 2.0 2.2  PHOS  --   --  4.0 3.5 4.2    GFR: Estimated Creatinine Clearance: 52.2 mL/min (by C-G formula based on SCr of 1.14 mg/dL). Liver Function Tests: Recent Labs  Lab 12/04/21 0755 12/05/21 0446 12/06/21 0605  AST 30 46* 36  ALT 22 33 29  ALKPHOS 50 52 50  BILITOT 0.5 0.7 0.4  PROT 6.2* 6.5 6.3*  ALBUMIN 2.8* 2.9* 2.8*    No results for input(s): "LIPASE", "AMYLASE" in the last 168 hours. No results for input(s): "AMMONIA" in the last 168 hours. Coagulation Profile: No results for  input(s): "INR", "PROTIME" in the last 168 hours. Cardiac Enzymes: No results for input(s): "CKTOTAL", "CKMB", "CKMBINDEX", "TROPONINI" in the last 168 hours. BNP (last 3 results) No results for input(s): "PROBNP" in the last 8760 hours. HbA1C: No results for input(s): "HGBA1C" in the last 72 hours. CBG: No results for input(s): "GLUCAP" in the last 168 hours. Lipid Profile: No results for input(s): "CHOL", "HDL", "  Cucumber", "TRIG", "CHOLHDL", "LDLDIRECT" in the last 72 hours. Thyroid Function Tests: No results for input(s): "TSH", "T4TOTAL", "FREET4", "T3FREE", "THYROIDAB" in the last 72 hours. Anemia Panel: No results for input(s): "VITAMINB12", "FOLATE", "FERRITIN", "TIBC", "IRON", "RETICCTPCT" in the last 72 hours. Sepsis Labs: No results for input(s): "PROCALCITON", "LATICACIDVEN" in the last 168 hours.  No results found for this or any previous visit (from the past 240 hour(s)).        Radiology Studies: No results found.      Scheduled Meds:  amLODipine  10 mg Oral Daily   apixaban  10 mg Oral BID   Followed by   Derrill Memo ON 12/12/2021] apixaban  5 mg Oral BID   gabapentin  100 mg Oral QHS   lisinopril  40 mg Oral Daily   polyethylene glycol  17 g Oral Daily   rosuvastatin  40 mg Oral Daily   Continuous Infusions:  sodium chloride 100 mL/hr at 12/04/21 0600     LOS: 3 days    Time spent:40 min    Novia Lansberry, Geraldo Docker, MD [facility]   If 7PM-7AM, please contact night-coverage 12/06/2021, 2:57 PM

## 2021-12-06 NOTE — Evaluation (Signed)
Physical Therapy Evaluation Patient Details Name: Mark [name] MRN: [id number] DOB: March 28, 1941 Today's Date: 12/06/2021  History of Present Illness  Mark [name] is a 81 y.o. African-American male with medical history significant for coronary artery disease, hypertension, dyslipidemia and CVA, was discharged on 8/16 after having a day hospitalization for CVA complicated with DVT and given petechial hemorrhagic transformation the patient was placed on IVC filter given brief contraindication to anticoagulation done.  He presents today with right leg pain without significant swelling and mild dyspnea as well as mild chest pain that started earlier this morning when he woke up.    Clinical Impression  Pt received in supine position and agreeable to therapy.  Pt moves well within room, and was able to transfer effectively into standing and then walking with use of RW.  Pt ambulated around the nursing station with RW, however when RW was taken away, pt unable to take larger steps and almost resorted to shuffling of the feet to ambulate.  Once given the RW, pt with greater cadence and step length bilaterally.  Pt modI without an AD prior to hospital stays.  Pt does exhibit R LE weakness and tends to bump into things on the R side when ambulating and requires verbal cuing in order to correct this.  Pt would benefit greatly from visiting CIR in order to return to PLOF and increase overall independence and QoL.  Pt does have wife who is able to be caretaker once discharged home from [facility].  Current discharge plans to CIR are most appropriate at this time.  Pt will continue to benefit from skilled therapy in order to address deficits listed below.      Recommendations for follow up therapy are one component of a multi-disciplinary discharge planning process, led by the attending physician.  Recommendations may be updated based on patient status, additional functional criteria and insurance  authorization.  Follow Up Recommendations Acute inpatient rehab (3hours/day) Can patient physically be transported by private vehicle: Yes    Assistance Recommended at Discharge Intermittent Supervision/Assistance  Patient can return home with the following  Assist for transportation;Assistance with cooking/housework;Direct supervision/assist for medications management;Direct supervision/assist for financial management;A little help with walking and/or transfers;A little help with bathing/dressing/bathroom;Help with stairs or ramp for entrance    Equipment Recommendations None recommended by PT  Recommendations for Other Services       Functional Status Assessment Patient has had a recent decline in their functional status and demonstrates the ability to make significant improvements in function in a reasonable and predictable amount of time.     Precautions / Restrictions Precautions Precautions: Fall Restrictions Weight Bearing Restrictions: No      Mobility  Bed Mobility Overal bed mobility: Needs Assistance Bed Mobility: Sit to Supine     Supine to sit: Supervision, HOB elevated     General bed mobility comments: able to scoot hips forward at EOB with supervision and VC    Transfers Overall transfer level: Needs assistance Equipment used: Rolling walker (2 wheels) Transfers: Sit to/from Stand Sit to Stand: Min guard           General transfer comment: Min verbal cues for sequencing    Ambulation/Gait Ambulation/Gait assistance: Min guard Gait Distance (Feet): 160 Feet Assistive device: Rolling walker (2 wheels) Gait Pattern/deviations: Step-through pattern, Drifts right/left, Trunk flexed Gait velocity: decreased     General Gait Details: Pt tends to bump into things on the R side and has to be given VC's to  prevent this from occurring.  Stairs            Wheelchair Mobility    Modified Rankin (Stroke Patients Only)       Balance Overall  balance assessment: Needs assistance Sitting-balance support: Feet supported Sitting balance-Leahy Scale: Good Sitting balance - Comments: good static sitting balance   Standing balance support: Reliant on assistive device for balance, During functional activity, Bilateral upper extremity supported Standing balance-Leahy Scale: Fair Standing balance comment: semi reliant on RW.                             Pertinent Vitals/Pain Pain Assessment Pain Assessment: No/denies pain    Home Living Family/patient expects to be discharged to:: Private residence Living Arrangements: Spouse/significant other Available Help at Discharge: Family;Available 24 hours/day (Wife no longer working, home with pt 24/7) Type of Home: House Home Access: Stairs to enter Entrance Stairs-Rails: None Entrance Stairs-Number of Steps: 1   Home Layout: One level Home Equipment: Grab bars - tub/shower;Rolling Walker (2 wheels);Cane - single point;Wheelchair - manual (lift chair)      Prior Function Prior Level of Function : Independent/Modified Independent             Mobility Comments: No AD at baseline, pt required assistance with getting out of bed from wife. Since discharging home from [facility] (<1 week ago), pt has been able to walk short distances with RW and transfer to w/c. ADLs Comments: Obtained from last OT eval on 8/10 due to wife not being present to provide most updated PLOF: "Independent/Mod I when last seen on 09/15/21, however, per wife pt needs physical assistance with all ADLs. Wife reports with pt's back pain recently that no position could relieve it (unable to lay down), not sleeping well. Pt with inability to get up right before coming to hospital, wife unsuccessful with helping pt get up and had to call EMS."     Hand Dominance   Dominant Hand: Right    Extremity/Trunk Assessment   Upper Extremity Assessment Upper Extremity Assessment: Defer to OT evaluation RUE Deficits /  Details: grip strength fair, UE AROM WFL for shoulder flexion, elbow fex/ext, wrist flex/ext LUE Deficits / Details: L residual weakness from past CVA, grip strength fair, UE AROM WFL for shoulder flexion, elbow fex/ext, wrist flex/ext    Lower Extremity Assessment Lower Extremity Assessment: Generalized weakness LLE Deficits / Details: L LE is significantly more weaker than the R LE.    Cervical / Trunk Assessment Cervical / Trunk Assessment: Normal  Communication   Communication: Receptive difficulties  Cognition Arousal/Alertness: Awake/alert Behavior During Therapy: WFL for tasks assessed/performed Overall Cognitive Status: Difficult to assess                                 General Comments: short term memory deficits, oriented to self, when asking PLOF questions pt stated "I don't know, you'll have to talk to my boss" re: wife        General Comments      Exercises     Assessment/Plan    PT Assessment Patient needs continued PT services  PT Problem List Decreased mobility;Decreased strength;Decreased balance;Decreased activity tolerance       PT Treatment Interventions DME instruction;Therapeutic exercise;Gait training;Balance training;Stair training;Neuromuscular re-education;Functional mobility training;Therapeutic activities;Patient/family education    PT Goals (Current goals can be found in the Care Plan section)  Acute Rehab PT Goals Patient Stated Goal: to go to CIR PT Goal Formulation: With patient Time For Goal Achievement: 12/20/21 Potential to Achieve Goals: Good    Frequency Min 2X/week     Co-evaluation               AM-PAC PT "6 Clicks" Mobility  Outcome Measure Help needed turning from your back to your side while in a flat bed without using bedrails?: A Little Help needed moving from lying on your back to sitting on the side of a flat bed without using bedrails?: A Little Help needed moving to and from a bed to a chair  (including a wheelchair)?: A Little Help needed standing up from a chair using your arms (e.g., wheelchair or bedside chair)?: A Little Help needed to walk in hospital room?: A Little Help needed climbing 3-5 steps with a railing? : A Lot 6 Click Score: 17    End of Session Equipment Utilized During Treatment: Gait belt Activity Tolerance: Patient tolerated treatment well Patient left: in bed;with call bell/phone within reach;with bed alarm set Nurse Communication: Mobility status;Other (comment) PT Visit Diagnosis: Other abnormalities of gait and mobility (R26.89)    Time: 5208-0223 PT Time Calculation (min) (ACUTE ONLY): 25 min   Charges:   PT Evaluation $PT Eval Low Complexity: 1 Low PT Treatments $Gait Training: 8-22 mins        Gwenlyn Saran, PT, DPT 12/06/21, 3:28 PM

## 2021-12-06 NOTE — Evaluation (Addendum)
Occupational Therapy Evaluation Patient Details Name: Mark [name] MRN: [id number] DOB: [birth year] Today's Date: 12/06/2021   History of Present Illness Mark [name] is a 81 y.o. African-American male with medical history significant for coronary artery disease, hypertension, dyslipidemia and CVA, was discharged on 8/16 after having a day hospitalization for CVA complicated with DVT and given petechial hemorrhagic transformation the patient was placed on IVC filter given brief contraindication to anticoagulation done.  He presents today with right leg pain without significant swelling and mild dyspnea as well as mild chest pain that started earlier this morning when he woke up.   Clinical Impression   Patient seen for OT evaluation. Patient presenting with decreased strength, decreased endurance, impaired balance, and decreased cognition impacting safety and independence with ADLs. Pt was requiring assistance for ADLs, IADLs, and functional mobility with a RW or w/c PTA. Pt able to complete bed mobility with supervision and VC, functional transfers with Min guard + RW, and UB dressing with set up A. Patient will benefit from acute OT to increase overall independence in the areas of ADLs and functional mobility. Upon hospital discharge, recommend AIR to maximize pt safety and return to PLOF.        Recommendations for follow up therapy are one component of a multi-disciplinary discharge planning process, led by the attending physician.  Recommendations may be updated based on patient status, additional functional criteria and insurance authorization.   Follow Up Recommendations  Acute inpatient rehab (3hours/day)    Assistance Recommended at Discharge Intermittent Supervision/Assistance  Patient can return home with the following Assistance with cooking/housework;Direct supervision/assist for financial management;Direct supervision/assist for medications management;Assist for  transportation;A little of help with walking and/or transfers;A little of help with bathing/dressing/bathroom;Help with stairs or ramp for entrance    Functional Status Assessment  Patient has had a recent decline in their functional status and demonstrates the ability to make significant improvements in function in a reasonable and predictable amount of time.  Equipment Recommendations  Other (comment) (defer to next venue of care)    Recommendations for Other Services       Precautions / Restrictions Precautions Precautions: Fall Restrictions Weight Bearing Restrictions: No      Mobility Bed Mobility Overal bed mobility: Needs Assistance Bed Mobility: Sit to Supine     Supine to sit: Supervision, HOB elevated     General bed mobility comments: ble to scoot hips forward at EOB with supervision and VC    Transfers Overall transfer level: Needs assistance Equipment used: Rolling walker (2 wheels) Transfers: Sit to/from Stand Sit to Stand: Min guard                  Balance Overall balance assessment: Needs assistance Sitting-balance support: Feet supported Sitting balance-Leahy Scale: Good Sitting balance - Comments: good static sitting balance   Standing balance support: Reliant on assistive device for balance, During functional activity, Bilateral upper extremity supported Standing balance-Leahy Scale: Fair Standing balance comment: no LOB upon standing                           ADL either performed or assessed with clinical judgement   ADL Overall ADL's : Needs assistance/impaired     Grooming: Wash/dry face;Set up;Bed level           Upper Body Dressing : Set up;Sitting                   Functional  mobility during ADLs: Rolling walker (2 wheels);Min guard;Cueing for safety General ADL Comments: Pt completed functional mobility to the recliner with Min guard + RW     Vision Patient Visual Report: Diplopia Vision  Assessment?: Vision impaired- to be further tested in functional context Additional Comments: Per physician neurology note on 11/25/21, "he has a dense right hemianiopia"     Perception     Praxis      Pertinent Vitals/Pain Pain Assessment Pain Assessment: No/denies pain (when asked about pain, pt reported "my legs feel funny")     Hand Dominance Right   Extremity/Trunk Assessment Upper Extremity Assessment Upper Extremity Assessment: RUE deficits/detail;LUE deficits/detail RUE Deficits / Details: grip strength fair, UE AROM WFL for shoulder flexion, elbow fex/ext, wrist flex/ext LUE Deficits / Details: L residual weakness from past CVA, grip strength fair, UE AROM WFL for shoulder flexion, elbow fex/ext, wrist flex/ext   Lower Extremity Assessment Lower Extremity Assessment: Defer to PT evaluation   Cervical / Trunk Assessment Cervical / Trunk Assessment: Normal   Communication Communication Communication: Receptive difficulties   Cognition Arousal/Alertness: Awake/alert Behavior During Therapy: WFL for tasks assessed/performed Overall Cognitive Status: Difficult to assess                                 General Comments: short term memory deficits, oriented to self, when asking PLOF questions pt stated "I don't know, you'll have to talk to my boss" re: wife     General Comments       Exercises     Shoulder Instructions      Home Living Family/patient expects to be discharged to:: Private residence Living Arrangements: Spouse/significant other Available Help at Discharge: Family;Available 24 hours/day (Wife no longer working, home with pt 24/7) Type of Home: House Home Access: Stairs to enter [employer] of Steps: 1 Entrance Stairs-Rails: None Home Layout: One level     Bathroom Shower/Tub: Tub/shower unit;Walk-in shower   Bathroom Toilet: Standard Bathroom Accessibility: Yes   Home Equipment: Grab bars - tub/shower;Rolling Walker (2  wheels);Cane - single point;Wheelchair - manual (lift chair)      Lives With: Spouse    Prior Functioning/Environment Prior Level of Function : Independent/Modified Independent             Mobility Comments: No AD at baseline, pt required assistance with getting out of bed from wife. Since discharging home from [facility] (<1 week ago), pt has been able to walk short distances with RW and transfer to w/c. ADLs Comments: Obtained from last OT eval on 8/10 due to wife not being present to provide most updated PLOF: "Independent/Mod I when last seen on 09/15/21, however, per wife pt needs physical assistance with all ADLs. Wife reports with pt's back pain recently that no position could relieve it (unable to lay down), not sleeping well. Pt with inability to get up right before coming to hospital, wife unsuccessful with helping pt get up and had to call EMS."        OT Problem List: Decreased strength;Decreased activity tolerance;Impaired balance (sitting and/or standing);Decreased safety awareness;Pain;Impaired vision/perception;Decreased cognition;Decreased coordination      OT Treatment/Interventions: Self-care/ADL training;Visual/perceptual remediation/compensation;Patient/family education;Therapeutic exercise;Therapeutic activities;Energy conservation;DME and/or AE instruction;Cognitive remediation/compensation;Balance training    OT Goals(Current goals can be found in the care plan section) Acute Rehab OT Goals Patient Stated Goal: return to PLOF OT Goal Formulation: With patient Time For Goal Achievement: 12/20/21 Potential to Achieve Goals:  Fair ADL Goals Pt Will Perform Grooming: with supervision;standing Pt Will Perform Upper Body Dressing: sitting;with supervision Pt Will Perform Lower Body Dressing: with supervision;sitting/lateral leans;with adaptive equipment Pt Will Transfer to Toilet: with supervision;bedside commode Pt Will Perform Toileting - Clothing Manipulation and  hygiene: sit to/from stand;with supervision  OT Frequency: Min 3X/week    Co-evaluation              AM-PAC OT "6 Clicks" Daily Activity     Outcome Measure Help from another person eating meals?: A Little Help from another person taking care of personal grooming?: A Little Help from another person toileting, which includes using toliet, bedpan, or urinal?: A Lot Help from another person bathing (including washing, rinsing, drying)?: A Lot Help from another person to put on and taking off regular upper body clothing?: A Little Help from another person to put on and taking off regular lower body clothing?: A Little 6 Click Score: 16   End of Session Equipment Utilized During Treatment: Gait belt;Rolling walker (2 wheels) Nurse Communication: Mobility status  Activity Tolerance: Patient tolerated treatment well Patient left: in chair;with call bell/phone within reach;with chair alarm set  OT Visit Diagnosis: Unsteadiness on feet (R26.81);Pain;Muscle weakness (generalized) (M62.81)                Time: 6222-9798 OT Time Calculation (min): 23 min Charges:  OT General Charges $OT Visit: 1 Visit OT Evaluation $OT Eval Low Complexity: 1 Low  [facility] MS, OTR/L ascom [phone]  12/06/21, 1:45 PM

## 2021-12-07 DIAGNOSIS — G609 Hereditary and idiopathic neuropathy, unspecified: Secondary | ICD-10-CM | POA: Diagnosis not present

## 2021-12-07 DIAGNOSIS — I10 Essential (primary) hypertension: Secondary | ICD-10-CM | POA: Diagnosis not present

## 2021-12-07 DIAGNOSIS — I82403 Acute embolism and thrombosis of unspecified deep veins of lower extremity, bilateral: Secondary | ICD-10-CM | POA: Diagnosis not present

## 2021-12-07 DIAGNOSIS — I2699 Other pulmonary embolism without acute cor pulmonale: Secondary | ICD-10-CM | POA: Diagnosis not present

## 2021-12-07 LAB — CBC WITH DIFFERENTIAL/PLATELET
Abs Immature Granulocytes: 0.08 10*3/uL — ABNORMAL HIGH (ref 0.00–0.07)
Basophils Absolute: 0.1 10*3/uL (ref 0.0–0.1)
Basophils Relative: 1 %
Eosinophils Absolute: 0.2 10*3/uL (ref 0.0–0.5)
Eosinophils Relative: 3 %
HCT: 32.2 % — ABNORMAL LOW (ref 39.0–52.0)
Hemoglobin: 9.9 g/dL — ABNORMAL LOW (ref 13.0–17.0)
Immature Granulocytes: 1 %
Lymphocytes Relative: 27 %
Lymphs Abs: 1.9 10*3/uL (ref 0.7–4.0)
MCH: 28 pg (ref 26.0–34.0)
MCHC: 30.7 g/dL (ref 30.0–36.0)
MCV: 91 fL (ref 80.0–100.0)
Monocytes Absolute: 0.7 10*3/uL (ref 0.1–1.0)
Monocytes Relative: 10 %
Neutro Abs: 4 10*3/uL (ref 1.7–7.7)
Neutrophils Relative %: 58 %
Platelets: 285 10*3/uL (ref 150–400)
RBC: 3.54 MIL/uL — ABNORMAL LOW (ref 4.22–5.81)
RDW: 13.6 % (ref 11.5–15.5)
WBC: 7 10*3/uL (ref 4.0–10.5)
nRBC: 0 % (ref 0.0–0.2)

## 2021-12-07 LAB — COMPREHENSIVE METABOLIC PANEL
ALT: 26 U/L (ref 0–44)
AST: 31 U/L (ref 15–41)
Albumin: 3 g/dL — ABNORMAL LOW (ref 3.5–5.0)
Alkaline Phosphatase: 52 U/L (ref 38–126)
Anion gap: 5 (ref 5–15)
BUN: 26 mg/dL — ABNORMAL HIGH (ref 8–23)
CO2: 24 mmol/L (ref 22–32)
Calcium: 8.9 mg/dL (ref 8.9–10.3)
Chloride: 110 mmol/L (ref 98–111)
Creatinine, Ser: 1.34 mg/dL — ABNORMAL HIGH (ref 0.61–1.24)
GFR, Estimated: 54 mL/min — ABNORMAL LOW (ref >60)
Glucose, Bld: 86 mg/dL (ref 70–99)
Potassium: 4.6 mmol/L (ref 3.5–5.1)
Sodium: 139 mmol/L (ref 135–145)
Total Bilirubin: 0.6 mg/dL (ref 0.3–1.2)
Total Protein: 6.5 g/dL (ref 6.5–8.1)

## 2021-12-07 LAB — PHOSPHORUS: Phosphorus: 4.5 mg/dL (ref 2.5–4.6)

## 2021-12-07 LAB — MAGNESIUM: Magnesium: 2.3 mg/dL (ref 1.7–2.4)

## 2021-12-07 NOTE — Progress Notes (Addendum)
Inpatient Rehab Coordinator Note:  I spoke with pt's spouse over the phone to discuss CIR recommendations and goals/expectations of CIR stay.  We reviewed 3 hrs/day of therapy, physician follow up, and average length of stay 2 weeks (dependent upon progress) with goals of supervision.  She is able to provide expected level of support and very interested in rehab program.  I reviewed insurance auth process with her and will start that request with [facility] today.  Will continue to follow and update once I have a determination.   Addendum: 6226: Note pt mobilizing extremely well with PT today. 400' with head turns, backwards walking, counting x3, and recall all improved.  Recommendations for f/u not updated.  Will discuss with PT in the AM regarding possible updated recommendations as insurance auth will be difficult to get with pt mobilizing so well.   Shann Medal, PT, DPT Admissions Coordinator [phone] 12/07/21  2:22 PM

## 2021-12-07 NOTE — Progress Notes (Signed)
[place] at [place] NAME: Anmol Fleck    MR#:  [id number]  DATE OF BIRTH:  January 19, 1941  SUBJECTIVE:   Patient seen earlier. Working with physical therapy in the hallway. Wife was earlier in the room was talking to home health rehab. No other issues per RN.   VITALS:  Blood pressure 126/74, pulse (!) 59, temperature 98.3 F (36.8 C), resp. rate 14, height '5\' 6"'$  (1.676 m), weight 81.1 kg, SpO2 96 %.  PHYSICAL EXAMINATION:   GENERAL:  81 y.o.-year-old patient lying in the bed with no acute distress.  LUNGS: Normal breath sounds bilaterally, no wheezing, rales, rhonchi.  CARDIOVASCULAR: S1, S2 normal. No murmurs, rubs, or gallops.  ABDOMEN: Soft, nontender, nondistended. Bowel sounds present.  EXTREMITIES: No  edema b/l.    NEUROLOGIC: nonfocal  patient is alert and awake mild right side weakness. No dysarthria SKIN: No obvious rash, lesion, or ulcer.   LABORATORY PANEL:  CBC Recent Labs  Lab 12/07/21 0641  WBC 7.0  HGB 9.9*  HCT 32.2*  PLT 285    Chemistries  Recent Labs  Lab 12/07/21 0641  NA 139  K 4.6  CL 110  CO2 24  GLUCOSE 86  BUN 26*  CREATININE 1.34*  CALCIUM 8.9  MG 2.3  AST 31  ALT 26  ALKPHOS 52  BILITOT 0.6    Assessment and Plan  JABARI SWOVELAND is a 81 y.o. African-American male with medical history significant for coronary artery disease, hypertension, dyslipidemia and CVA, was discharged on 8/16 after having a day hospitalization for CVA complicated with DVT and given petechial hemorrhagic transformation the patient was placed on IVC filter given brief contraindication to anticoagulation done.  He presents  to ER with right leg pain without significant swelling and mild dyspnea as well as mild chest pain that started earlier this morning when he woke up.    Acute PE, right side -- initially was placed on IV heparin drip-- now transition to eliquis after neurology evaluated and initially was plan  to start on 12/08/21 -- sats are hundred percent on room air -- no respiratory distress  Bilateral lower extremity DVT -- patient had IVC filter placed during last admission -- now on PO eliquis -- will follow-up with Dr. Lucky Cowboy as outpatient to eventually remove filter later date  Acute subacute CVA ,eft occipital -- recently hospitalized 8/8--8/16 for CVA -- CT head this admission No acute intracranial abnormality.  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 Atrophy with chronic small vessel ischemic disease. -- Seen by physical therapy recommends acute inpatient rehab. TOC for discharge planning  Essential hypertension -- continue home meds  Peripheral neuropathy -- continue Neurontin  Dyslipidemia -- continue statins    Procedures: none Family communication : wife Consults : curb sided neurology CODE STATUS: full DVT Prophylaxis : eliquis Level of care: Med-Surg Status is: Inpatient Remains inpatient appropriate because: awaiting evaluation for CIR    TOTAL TIME TAKING CARE OF THIS PATIENT: 35 minutes.  >50% time spent on counselling and coordination of care  Note: This dictation was prepared with Dragon dictation along with smaller phrase technology. Any transcriptional errors that result from this process are unintentional.  Fritzi Mandes M.D    [facility]   CC: Primary care physician; Donald Prose, MD

## 2021-12-07 NOTE — Progress Notes (Signed)
   12/07/21 1100 12/07/21 1824  Neurological  Neuro (WDL) WDL X  Level of Consciousness Alert  --   Orientation Level  --  Disoriented to place;Disoriented to time   Change in mental status. Notified MD patel, VSS. Per MD continue  to monitor

## 2021-12-07 NOTE — Progress Notes (Signed)
Physical Therapy Treatment Patient Details Name: Mark [name] MRN: [id number] DOB: 12-31-1940 Today's Date: 12/07/2021   History of Present Illness Mark [name] is a 81 y.o. African-American male with medical history significant for coronary artery disease, hypertension, dyslipidemia and CVA, was discharged on 8/16 after having a day hospitalization for CVA complicated with DVT and given petechial hemorrhagic transformation the patient was placed on IVC filter given brief contraindication to anticoagulation done.  He presents today with right leg pain without significant swelling and mild dyspnea as well as mild chest pain that started earlier this morning when he woke up.    PT Comments    Pt received seated in recliner upon arrival to room and pt agreeable to therapy.  Pt participated in mobility training with increased balance related tasks during treatment session today.  Pt was able to perform with RW, ambulation with head turns, backwards walking, counting by 3's, and recall of items.  Pt performed well with slow, steady gait, but was able to do all tasks.  Pt did slow down with head turns, specifically when turning head to the R and has limited neck ROM that prevented him from looking up very far.  Current discharge plans to CIR remain appropriate at this time.  Pt will continue to benefit from skilled therapy in order to address deficits listed below.      Recommendations for follow up therapy are one component of a multi-disciplinary discharge planning process, led by the attending physician.  Recommendations may be updated based on patient status, additional functional criteria and insurance authorization.  Follow Up Recommendations  Acute inpatient rehab (3hours/day) Can patient physically be transported by private vehicle: Yes   Assistance Recommended at Discharge Intermittent Supervision/Assistance  Patient can return home with the following Assist for  transportation;Assistance with cooking/housework;Direct supervision/assist for medications management;Direct supervision/assist for financial management;A little help with walking and/or transfers;A little help with bathing/dressing/bathroom;Help with stairs or ramp for entrance   Equipment Recommendations  None recommended by PT    Recommendations for Other Services       Precautions / Restrictions Precautions Precautions: Fall Restrictions Weight Bearing Restrictions: No     Mobility  Bed Mobility Overal bed mobility: Needs Assistance Bed Mobility: Sit to Supine     Supine to sit: Supervision, HOB elevated Sit to supine: Supervision   General bed mobility comments: able to scoot hips forward at EOB with supervision and VC    Transfers Overall transfer level: Needs assistance Equipment used: Rolling walker (2 wheels) Transfers: Sit to/from Stand Sit to Stand: Supervision           General transfer comment: pt needing extra time to perform    Ambulation/Gait Ambulation/Gait assistance: Min guard Gait Distance (Feet): 400 Feet Assistive device: Rolling walker (2 wheels), 1 person hand held assist, None Gait Pattern/deviations: Step-through pattern, Drifts right/left, Trunk flexed Gait velocity: decreased     General Gait Details: Pt better with ambulation and not bumping into things.   Stairs             Wheelchair Mobility    Modified Rankin (Stroke Patients Only)       Balance Overall balance assessment: Needs assistance Sitting-balance support: Feet supported Sitting balance-Leahy Scale: Good Sitting balance - Comments: good static sitting balance   Standing balance support: Reliant on assistive device for balance, During functional activity, Bilateral upper extremity supported Standing balance-Leahy Scale: Fair Standing balance comment: semi reliant on RW.  Cognition Arousal/Alertness:  Awake/alert Behavior During Therapy: WFL for tasks assessed/performed Overall Cognitive Status: Difficult to assess                                 General Comments: short term memory deficits, oriented to self, when asking PLOF questions pt stated "I don't know, you'll have to talk to my boss" re: wife        Exercises Other Exercises Other Exercises: Dynamic standing balance training with ambulation including head turns, backwards ambulation, recall, and calculations during ambulation.    General Comments        Pertinent Vitals/Pain Pain Assessment Pain Assessment: No/denies pain    Home Living                          Prior Function            PT Goals (current goals can now be found in the care plan section) Acute Rehab PT Goals Patient Stated Goal: to go to CIR PT Goal Formulation: With patient Time For Goal Achievement: 12/20/21 Potential to Achieve Goals: Good Progress towards PT goals: Progressing toward goals    Frequency    Min 2X/week      PT Plan      Co-evaluation              AM-PAC PT "6 Clicks" Mobility   Outcome Measure  Help needed turning from your back to your side while in a flat bed without using bedrails?: A Little Help needed moving from lying on your back to sitting on the side of a flat bed without using bedrails?: A Little Help needed moving to and from a bed to a chair (including a wheelchair)?: A Little Help needed standing up from a chair using your arms (e.g., wheelchair or bedside chair)?: A Little Help needed to walk in hospital room?: A Little Help needed climbing 3-5 steps with a railing? : A Lot 6 Click Score: 17    End of Session Equipment Utilized During Treatment: Gait belt Activity Tolerance: Patient tolerated treatment well Patient left: in bed;with call bell/phone within reach;with bed alarm set Nurse Communication: Mobility status;Other (comment) PT Visit Diagnosis: Other  abnormalities of gait and mobility (R26.89)     Time: 0205-0228 PT Time Calculation (min) (ACUTE ONLY): 23 min  Charges:  $Gait Training: 8-22 mins $Neuromuscular Re-education: 8-22 mins                     Gwenlyn Saran, PT, DPT 12/07/21, 3:25 PM

## 2021-12-07 NOTE — Progress Notes (Signed)
Report given to receiving RN Marissa on 1C. Charge RN Holley Dexter transporting patient to room 122 via bed. All belongings sent with patient.  Earleen Reaper, RN

## 2021-12-08 DIAGNOSIS — I2699 Other pulmonary embolism without acute cor pulmonale: Secondary | ICD-10-CM | POA: Diagnosis not present

## 2021-12-08 DIAGNOSIS — E785 Hyperlipidemia, unspecified: Secondary | ICD-10-CM | POA: Diagnosis not present

## 2021-12-08 DIAGNOSIS — I82403 Acute embolism and thrombosis of unspecified deep veins of lower extremity, bilateral: Secondary | ICD-10-CM | POA: Diagnosis not present

## 2021-12-08 DIAGNOSIS — I10 Essential (primary) hypertension: Secondary | ICD-10-CM | POA: Diagnosis not present

## 2021-12-08 LAB — COMPREHENSIVE METABOLIC PANEL
ALT: 26 U/L (ref 0–44)
AST: 36 U/L (ref 15–41)
Albumin: 3.2 g/dL — ABNORMAL LOW (ref 3.5–5.0)
Alkaline Phosphatase: 52 U/L (ref 38–126)
Anion gap: 5 (ref 5–15)
BUN: 27 mg/dL — ABNORMAL HIGH (ref 8–23)
CO2: 24 mmol/L (ref 22–32)
Calcium: 9.1 mg/dL (ref 8.9–10.3)
Chloride: 110 mmol/L (ref 98–111)
Creatinine, Ser: 1.24 mg/dL (ref 0.61–1.24)
GFR, Estimated: 59 mL/min — ABNORMAL LOW (ref >60)
Glucose, Bld: 95 mg/dL (ref 70–99)
Potassium: 4.7 mmol/L (ref 3.5–5.1)
Sodium: 139 mmol/L (ref 135–145)
Total Bilirubin: 0.5 mg/dL (ref 0.3–1.2)
Total Protein: 6.8 g/dL (ref 6.5–8.1)

## 2021-12-08 LAB — CBC WITH DIFFERENTIAL/PLATELET
Abs Immature Granulocytes: 0.11 10*3/uL — ABNORMAL HIGH (ref 0.00–0.07)
Basophils Absolute: 0.1 10*3/uL (ref 0.0–0.1)
Basophils Relative: 1 %
Eosinophils Absolute: 0.2 10*3/uL (ref 0.0–0.5)
Eosinophils Relative: 2 %
HCT: 33.3 % — ABNORMAL LOW (ref 39.0–52.0)
Hemoglobin: 10.2 g/dL — ABNORMAL LOW (ref 13.0–17.0)
Immature Granulocytes: 1 %
Lymphocytes Relative: 27 %
Lymphs Abs: 2.1 10*3/uL (ref 0.7–4.0)
MCH: 27.9 pg (ref 26.0–34.0)
MCHC: 30.6 g/dL (ref 30.0–36.0)
MCV: 91 fL (ref 80.0–100.0)
Monocytes Absolute: 0.8 10*3/uL (ref 0.1–1.0)
Monocytes Relative: 10 %
Neutro Abs: 4.6 10*3/uL (ref 1.7–7.7)
Neutrophils Relative %: 59 %
Platelets: 304 10*3/uL (ref 150–400)
RBC: 3.66 MIL/uL — ABNORMAL LOW (ref 4.22–5.81)
RDW: 14 % (ref 11.5–15.5)
WBC: 7.8 10*3/uL (ref 4.0–10.5)
nRBC: 0 % (ref 0.0–0.2)

## 2021-12-08 LAB — MAGNESIUM: Magnesium: 2.2 mg/dL (ref 1.7–2.4)

## 2021-12-08 LAB — PHOSPHORUS: Phosphorus: 4.9 mg/dL — ABNORMAL HIGH (ref 2.5–4.6)

## 2021-12-08 MED ORDER — APIXABAN 5 MG PO TABS
ORAL_TABLET | ORAL | 0 refills | Status: DC
Start: 2021-12-08 — End: 2021-12-28

## 2021-12-08 MED ORDER — LISINOPRIL 40 MG PO TABS: 40.0000 mg | ORAL_TABLET | Freq: Every day | ORAL | 1 refills | Status: DC

## 2021-12-08 MED ORDER — AMLODIPINE BESYLATE 10 MG PO TABS: 10.0000 mg | ORAL_TABLET | Freq: Every day | ORAL | 1 refills | Status: DC

## 2021-12-08 MED ORDER — ROSUVASTATIN CALCIUM 40 MG PO TABS: 40.0000 mg | ORAL_TABLET | Freq: Every day | ORAL | 1 refills | Status: DC

## 2021-12-08 MED ORDER — TERAZOSIN HCL 5 MG PO CAPS: 5.0000 mg | ORAL_CAPSULE | Freq: Every day | ORAL | 1 refills | Status: DC

## 2021-12-08 NOTE — Progress Notes (Signed)
Mobility Specialist - Progress Note    12/08/21 0859  Mobility  Activity Ambulated with assistance in room;Dangled on edge of bed;Stood at bedside  Level of Assistance Minimal assist, patient does 75% or more  Assistive Device Front wheel walker  Distance Ambulated (ft) 4 ft  Activity Response Tolerated well  $Mobility charge 1 Mobility   Pt supine upon entry, utilizing RA. Pt transferred to EOB and stood MinA. Pt ambulated a few steps forwards and backwards using RW MinA. Pt voiced some concerns of pain before and during ambulation, nurse notified. Pt left supine with alarm set and needs in reach. No complaints.   Candie Mile Mobility Specialist 12/08/21 9:02 AM

## 2021-12-08 NOTE — TOC Progression Note (Signed)
Transition of Care [facility]) - Progression Note    Patient Details  Name: CHANDRA FEGER MRN: [id number] Date of Birth: May 26, 1940  Transition of Care [facility]) CM/SW [place], RN Phone Number: 12/08/2021, 4:45 PM  Clinical Narrative:   RNCM spoke to patient and wife at bedside.  Spouse states she is comfortable taking patient home with home health and patient is amenable as well.    [facility] home [facility] to provide services as per Judson Roch, spouse states she has received a call and they are expected at patient's home tomorrow.  Hospital bed recommended by therapy, RNCM notified Zach from [facility] for delivery, spouse states they are scheduled to be there at 1800 today. Spouse states patient has walker, wheelchair and BSC at home already, denies further needs.  Spouse has picked up patient meds at pharmacy, she asssists him to administer with compliance.  She can transport to appointments. Spouse declines further toc at this time, toc contact information provided in the event needs arise prior to discharge today, wife will transport home.    Expected Discharge Plan: [place] Barriers to Discharge: Continued Medical Work up  Expected Discharge Plan and Services Expected Discharge Plan: [place], Durable Medical Equipment Living arrangements for the past 2 months: [place] Expected Discharge Date: 12/08/21                         HH Arranged: RN, PT, OT, Nurse's Aide [place] Agency: Other - See comment Elliot Cousin) Date HH Agency Contacted: 12/04/21   Representative spoke with at [place]: Rema Jasmine   Social Determinants of Health (SDOH) Interventions    Readmission Risk Interventions     No data to display

## 2021-12-08 NOTE — Therapy (Cosign Needed)
    Durable Medical Equipment  (From admission, onward)           Start     Ordered   12/08/21 1233  For home use only DME Hospital bed  Once       Question Answer Comment  Length of Need Lifetime   Patient has (list medical condition): CVA   Head must be elevated greater than: 30 degrees   Bed type Semi-electric   Support Surface: Gel Overlay      12/08/21 1233

## 2021-12-08 NOTE — Discharge Summary (Signed)
Physician Discharge Summary   Patient: Mark [name] MRN: [id number] DOB: [birth year]  Admit date:     12/03/2021  Discharge date: 12/08/21  Discharge Physician: Fritzi Mandes   PCP: Donald Prose, MD   Recommendations at discharge:    F/u PCP in 1-2 weeks  Discharge Diagnoses:  Acute PE, right  H/o bilateral LE DVT (known)  Hospital Course:  Mark [name] is a 81 y.o. African-American male with medical history significant for coronary artery disease, hypertension, dyslipidemia and CVA, was discharged on 8/16 after having a day hospitalization for CVA complicated with DVT and given petechial hemorrhagic transformation the patient was placed on IVC filter given brief contraindication to anticoagulation done.  He presents  to ER with right leg pain without significant swelling and mild dyspnea as well as mild chest pain that started earlier this morning when he woke up.     Acute PE, right side -- initially was placed on IV heparin drip-- now transition to eliquis after neurology evaluated and initially was plan to start on 12/08/21 -- sats are hundred percent on room air -- no respiratory distress   Bilateral lower extremity DVT -- patient had IVC filter placed during last admission -- now on PO eliquis -- will follow-up with Dr. Lucky Cowboy as outpatient to eventually remove filter later date   H/o Acute/subacute CVA ,left occipital -- recently hospitalized 8/8--8/16 for CVA -- CT head this admission No acute intracranial abnormality.  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 Atrophy with chronic small vessel ischemic disease. -- Seen by physical therapy recommends acute inpatient rehab.  --pt improving with PT/OT and now recommends HHPT/OT--arranged. D/w wife will d/c to home today   Essential hypertension -- continue home meds   Peripheral neuropathy -- continue Neurontin   Dyslipidemia -- continue statins       Procedures:  none Family communication : wife in the room today Consults : curb sided neurology CODE STATUS: full DVT Prophylaxis : eliquis      Disposition: Home health Diet recommendation:  Discharge Diet Orders (From admission, onward)     Start     Ordered   12/08/21 0000  Diet - low sodium heart healthy        12/08/21 1256           Cardiac diet DISCHARGE MEDICATION: Allergies as of 12/08/2021       Reactions   Atorvastatin Other (See Comments)        Medication List     STOP taking these medications    lidocaine 5 % ointment Commonly known as: XYLOCAINE       TAKE these medications    acetaminophen 325 MG tablet Commonly known as: TYLENOL Take 2 tablets (650 mg total) by mouth every 4 (four) hours as needed for mild pain (or temp > 37.5 C (99.5 F)).   amLODipine 10 MG tablet Commonly known as: NORVASC Take 1 tablet (10 mg total) by mouth daily.   apixaban 5 MG Tabs tablet Commonly known as: ELIQUIS Two tabs po wice a day for one week then one tab po twice a day afterwards   aspirin EC 81 MG tablet Take 1 tablet (81 mg total) by mouth daily. Swallow whole.   docusate sodium 100 MG capsule Commonly known as: Colace Take 1 capsule (100 mg total) by mouth daily as needed.   gabapentin 100 MG capsule Commonly known as: Neurontin Take 1 capsule (100 mg total) by mouth at  bedtime.   lisinopril 40 MG tablet Commonly known as: ZESTRIL Take 1 tablet (40 mg total) by mouth daily.   polyethylene glycol 17 g packet Commonly known as: MIRALAX / GLYCOLAX Take 17 g by mouth daily.   rosuvastatin 40 MG tablet Commonly known as: CRESTOR Take 1 tablet (40 mg total) by mouth daily.   terazosin 5 MG capsule Commonly known as: HYTRIN Take 1 capsule (5 mg total) by mouth at bedtime.               Durable Medical Equipment  (From admission, onward)           Start     Ordered   12/08/21 1233  For home use only DME Hospital bed  Once       Question  Answer Comment  Length of Need Lifetime   Patient has (list medical condition): CVA   Head must be elevated greater than: 30 degrees   Bed type Semi-electric   Support Surface: Gel Overlay      12/08/21 1233            Follow-up Information     Donald Prose, MD. Schedule an appointment as soon as possible for a visit in 1 week(s).   Specialty: Family Medicine Why: PE, h/o CVA Contact information: [address] [place] [id number] [phone]                Discharge Exam: [name] Weights   12/03/21 1854 12/07/21 0452  Weight: 82.8 kg 81.1 kg     Condition at discharge: fair  The results of significant diagnostics from this hospitalization (including imaging, microbiology, ancillary and laboratory) are listed below for reference.   Imaging Studies: CT Angio Chest PE W and/or Wo Contrast  Result Date: 12/03/2021 CLINICAL DATA:  Chest pain and altered mental status. History of DVT EXAM: CT ANGIOGRAPHY CHEST WITH CONTRAST TECHNIQUE: Multidetector CT imaging of the chest was performed using the standard protocol during bolus administration of intravenous contrast. Multiplanar CT image reconstructions and MIPs were obtained to evaluate the vascular anatomy. RADIATION DOSE REDUCTION: This exam was performed according to the departmental dose-optimization program which includes automated exposure control, adjustment of the mA and/or kV according to patient size and/or use of iterative reconstruction technique. CONTRAST:  54m OMNIPAQUE IOHEXOL 350 MG/ML SOLN COMPARISON:  CT chest 09/15/2018 FINDINGS: Cardiovascular: There is adequate opacification of the pulmonary arteries to the segmental level. There is web-like nonocclusive clot at the bifurcation of the right upper lobe pulmonary artery (9-134). No other pulmonary emboli are identified. There is no convincing evidence of right heart strain. The main pulmonary artery is not dilated. The heart size is mildly  enlarged. There is no pericardial effusion. There is coronary artery disease and calcified atherosclerotic plaque of the thoracic aorta. Mediastinum/Nodes: The thyroid is unremarkable. The esophagus is grossly unremarkable. There is no mediastinal, hilar, or axillary lymphadenopathy. Lungs/Pleura: The trachea and central airways are patent. There is prominent bilateral bronchial wall thickening. There are trace bilateral pleural effusions with adjacent opacities likely reflecting atelectasis. There is no focal consolidation. There is no pulmonary edema. There is no pneumothorax There are no suspicious nodules. Upper Abdomen: Bilateral renal cysts are noted. The imaged portions of the upper abdominal viscera are otherwise unremarkable. Musculoskeletal: There is no acute osseous abnormality or suspicious osseous lesion. Review of the MIP images confirms the above findings. IMPRESSION: 1. Small burden of web-like nonocclusive clot at the right upper lobe pulmonary artery  bifurcation without evidence of right heart strain. 2. Trace bilateral pleural effusions and central bronchial wall thickening which may reflect bronchitis. Aortic Atherosclerosis (ICD10-I70.0). These results were called by telephone at the time of interpretation on 12/03/2021 at 11:44 am to provider Dr. Jacelyn Grip, who verbally acknowledged these results. Electronically Signed   By: Valetta Mole M.D.   On: 12/03/2021 11:44   CT HEAD WO CONTRAST (5MM)  Result Date: 12/03/2021 CLINICAL DATA:  Mental status changes. EXAM: CT HEAD WITHOUT CONTRAST TECHNIQUE: Contiguous axial images were obtained from the base of the skull through the vertex without intravenous contrast. RADIATION DOSE REDUCTION: This exam was performed according to the departmental dose-optimization program which includes automated exposure control, adjustment of the mA and/or kV according to patient size and/or use of iterative reconstruction technique. COMPARISON:  11/26/2021. FINDINGS:  Brain: There is no evidence for acute hemorrhage, hydrocephalus, mass lesion, or abnormal extra-axial fluid collection. No definite CT evidence for acute infarction. 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 Diffuse loss of parenchymal volume is consistent with atrophy. Patchy low attenuation in the deep hemispheric and periventricular white matter is nonspecific, but likely reflects chronic microvascular ischemic demyelination. Vascular: No hyperdense vessel or unexpected calcification. Skull: No evidence for fracture. No worrisome lytic or sclerotic lesion. Sinuses/Orbits: The visualized paranasal sinuses and mastoid air cells are clear. Visualized portions of the globes and intraorbital fat are unremarkable. Other: None. IMPRESSION: 1. No acute intracranial abnormality. 2. 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 3. Atrophy with chronic small vessel ischemic disease. Electronically Signed   By: Misty Stanley M.D.   On: 12/03/2021 06:07   DG Chest 2 View  Result Date: 12/03/2021 CLINICAL DATA:  Chest pain. EXAM: CHEST - 2 VIEW COMPARISON:  11/24/2021 FINDINGS: 0514 hours. Low volume film. Cardiopericardial silhouette is at upper limits of normal for size. Basilar atelectasis evident. No edema or focal airspace consolidation. No substantial pleural effusion. Telemetry leads overlie the chest. IMPRESSION: Low volume film with basilar atelectasis. Electronically Signed   By: Misty Stanley M.D.   On: 12/03/2021 06:04   DG Hip Unilat W or Wo Pelvis 2-3 Views Right  Result Date: 12/03/2021 CLINICAL DATA:  Right leg pain. EXAM: DG HIP (WITH OR WITHOUT PELVIS) 2-3V RIGHT COMPARISON:  None Available. FINDINGS: No evidence for acute fracture. SI joints and symphysis pubis unremarkable. Patient is status post left total hip replacement, incompletely visualize femoral component. AP and frog-leg lateral views of the right hip  show no evidence for femoral neck fracture. Degenerative spurring noted in the acetabulum and femoral head. IMPRESSION: Degenerative changes in the right hip without acute bony findings. Electronically Signed   By: Misty Stanley M.D.   On: 12/03/2021 06:03   CT HEAD WO CONTRAST (5MM)  Result Date: 11/26/2021 CLINICAL DATA:  Stroke follow-up EXAM: CT HEAD WITHOUT CONTRAST TECHNIQUE: Contiguous axial images were obtained from the base of the skull through the vertex without intravenous contrast. RADIATION DOSE REDUCTION: This exam was performed according to the departmental dose-optimization program which includes automated exposure control, adjustment of the mA and/or kV according to patient size and/or use of iterative reconstruction technique. COMPARISON:  Two days ago FINDINGS: Brain: Well established left occipital infarction, non progressed. Chronic small vessel ischemia which is extensive. 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 Cerebral volume loss in keeping with age. No hematoma, hydrocephalus, or swelling Vascular: No hyperdense vessel or unexpected calcification. Skull: Normal. Negative for  fracture or focal lesion. Sinuses/Orbits: No acute finding. IMPRESSION: 1. No new or progressive finding. 2. Extensive chronic small vessel ischemia with subacute to chronic left occipital infarct. Electronically Signed   By: Jorje Guild M.D.   On: 11/26/2021 06:13   ECHOCARDIOGRAM COMPLETE  Result Date: 11/25/2021    ECHOCARDIOGRAM REPORT   Patient Name:   DRAKEN FARRIOR Date of Exam: 11/24/2021 Medical Rec #:  [id number]         Height:       66.0 in Accession #:    [id number]        Weight:       190.0 lb Date of Birth:  1941/02/15         BSA:          1.957 m Patient Age:    43 years          BP:           167/82 mmHg Patient Gender: M                 HR:           68 bpm. Exam Location:  [facility] Procedure: 2D Echo, Cardiac Doppler and Color Doppler Indications:     I63.9 Stroke  History:          Patient has no prior history of Echocardiogram examinations.                  CAD, Stroke; Risk Factors:Hypertension.  Sonographer:     Cresenciano Lick RDCS Referring Phys:  [id number] RONDELL A SMITH Diagnosing Phys: Kathlyn Sacramento MD IMPRESSIONS  1. Left ventricular ejection fraction, by estimation, is 60 to 65%. The left ventricle has normal function. The left ventricle has no regional wall motion abnormalities. There is mild left ventricular hypertrophy. Left ventricular diastolic parameters were normal.  2. Right ventricular systolic function is normal. The right ventricular size is normal. Tricuspid regurgitation signal is inadequate for assessing PA pressure.  3. Left atrial size was mildly dilated.  4. The mitral valve is normal in structure. Mild to moderate mitral valve regurgitation. No evidence of mitral stenosis.  5. The aortic valve is normal in structure. Aortic valve regurgitation is not visualized. Aortic valve sclerosis/calcification is present, without any evidence of aortic stenosis. FINDINGS  Left Ventricle: Left ventricular ejection fraction, by estimation, is 60 to 65%. The left ventricle has normal function. The left ventricle has no regional wall motion abnormalities. The left ventricular internal cavity size was normal in size. There is  mild left ventricular hypertrophy. Left ventricular diastolic parameters were normal. Right Ventricle: The right ventricular size is normal. No increase in right ventricular wall thickness. Right ventricular systolic function is normal. Tricuspid regurgitation signal is inadequate for assessing PA pressure. Left Atrium: Left atrial size was mildly dilated. Right Atrium: Right atrial size was normal in size. Pericardium: There is no evidence of pericardial effusion. Mitral Valve: The mitral valve is normal in structure. Mild mitral annular calcification. Mild to moderate mitral valve regurgitation. No evidence of mitral valve stenosis. Tricuspid Valve:  The tricuspid valve is normal in structure. Tricuspid valve regurgitation is not demonstrated. No evidence of tricuspid stenosis. Aortic Valve: The aortic valve is normal in structure. Aortic valve regurgitation is not visualized. Aortic valve sclerosis/calcification is present, without any evidence of aortic stenosis. Pulmonic Valve: The pulmonic valve was normal in structure. Pulmonic valve regurgitation is not visualized. No evidence of pulmonic stenosis. Aorta: The aortic root is normal  in size and structure. Venous: The inferior vena cava was not well visualized. IAS/Shunts: No atrial level shunt detected by color flow Doppler.  LEFT VENTRICLE PLAX 2D LVIDd:         4.60 cm   Diastology LVIDs:         2.40 cm   LV e' medial:    7.72 cm/s LV PW:         1.00 cm   LV E/e' medial:  12.7 LV IVS:        0.80 cm   LV e' lateral:   8.49 cm/s LVOT diam:     2.20 cm   LV E/e' lateral: 11.5 LV SV:         84 LV SV Index:   43 LVOT Area:     3.80 cm  RIGHT VENTRICLE RV Basal diam:  3.60 cm RV S prime:     9.57 cm/s TAPSE (M-mode): 2.0 cm LEFT ATRIUM             Index        RIGHT ATRIUM           Index LA diam:        4.50 cm 2.30 cm/m   RA Area:     10.90 cm LA Vol (A2C):   93.2 ml 47.63 ml/m  RA Volume:   20.70 ml  10.58 ml/m LA Vol (A4C):   56.6 ml 28.93 ml/m LA Biplane Vol: 77.5 ml 39.61 ml/m  AORTIC VALVE LVOT Vmax:   88.70 cm/s LVOT Vmean:  61.400 cm/s LVOT VTI:    0.220 m  AORTA Ao Root diam: 3.40 cm Ao Asc diam:  3.30 cm MITRAL VALVE MV Area (PHT): 3.08 cm     SHUNTS MV Decel Time: 246 msec     Systemic VTI:  0.22 m MV E velocity: 97.90 cm/s   Systemic Diam: 2.20 cm MV A velocity: 109.00 cm/s MV E/A ratio:  0.90 Kathlyn Sacramento MD Electronically signed by Kathlyn Sacramento MD Signature Date/Time: 11/25/2021/5:49:55 PM    Final    PERIPHERAL VASCULAR CATHETERIZATION  Result Date: 11/25/2021 See surgical note for result.  NM Pulmonary Perfusion  Result Date: 11/25/2021 CLINICAL DATA:  81 year old male with  suspected pulmonary embolism EXAM: NUCLEAR MEDICINE PERFUSION LUNG SCAN TECHNIQUE: Perfusion images were obtained in multiple projections after intravenous injection of radiopharmaceutical. Ventilation scans intentionally deferred if perfusion scan and chest x-ray adequate for interpretation during COVID 19 epidemic. RADIOPHARMACEUTICALS:  4.42 mCi Tc-58mMAA IV COMPARISON:  Chest x-ray 11/24/2021 FINDINGS: Planar scintigraphic images of the chest were performed after standard administration of technetium MAA in standard projections. No large perfusion defects. IMPRESSION: No large perfusion defects identified, compatible with low probability of PE. Electronically Signed   By: JCorrie MckusickD.O.   On: 11/25/2021 12:18   [place] Img Lower Bilateral (DVT)  Result Date: 11/25/2021 CLINICAL DATA:  Elevated D-dimer, prostate cancer EXAM: BILATERAL LOWER EXTREMITY VENOUS DOPPLER ULTRASOUND TECHNIQUE: Gray-scale sonography with compression, as well as color and duplex ultrasound, were performed to evaluate the deep venous system(s) from the level of the common femoral vein through the popliteal and proximal calf veins. COMPARISON:  None Available. FINDINGS: VENOUS The common femoral veins bilaterally are widely patent with appropriate antegrade flow, compressibility, demonstrate appropriate respiratory variation. Greater saphenous veins of the saphenofemoral junctions are patent bilaterally. The femoral veins and popliteal veins are widely patent with appropriate antegrade flow, compressibility, and augmentation. The right profundus femoral vein is patent  with appropriate antegrade flow where visualized. The left peripheral mid femoral vein demonstrates acute appearing, near occlusive thrombus protruding into the common femoral venous confluence. There is occlusive DVT identified within the visualized posterior tibial and peroneal veins of the lower extremities bilaterally. This does not appear to extend in the  popliteal veins on the presented images. OTHER None. Limitations: none IMPRESSION: 1. Acute appearing, near occlusive DVT within the left profundus femoral vein protruding into the common femoral venous confluence without occlusion of this structure. 2. Occlusive DVT within the visualized posterior tibial and peroneal veins of the lower extremities bilaterally. This does not appear to extend in the popliteal veins on the presented images. 3. These results will be called to the ordering clinician or representative by the Radiologist Assistant, and communication documented in the PACS or [employer]. Electronically Signed   By: Fidela Salisbury M.D.   On: 11/25/2021 02:08   MR ANGIO HEAD WO CONTRAST  Result Date: 11/24/2021 CLINICAL DATA:  Stroke EXAM: MRA NECK WITHOUT CONTRAST MRA HEAD WITHOUT CONTRAST TECHNIQUE: Angiographic images of the Circle of Willis were acquired using MRA technique without intravenous contrast. COMPARISON:  MRI head 11/24/2021 FINDINGS: MRA NECK FINDINGS Carotid bifurcation widely patent without stenosis. Both vertebral arteries are patent with antegrade flow. No vertebral artery stenosis. MRA HEAD FINDINGS Internal carotid artery widely patent bilaterally without stenosis. Moderate stenosis left A2 segment. Mild stenosis right A2 segment. Middle cerebral arteries patent without stenosis. No vascular malformation Both vertebral arteries are patent to the basilar. Right PICA patent. Left PICA not visualized. AICA patent bilaterally. Basilar patent. Superior cerebellar and posterior cerebral arteries patent. Fetal origin right posterior cerebral artery. Mild stenosis distal right posterior cerebral artery. Mild stenosis distal posterior cerebral artery bilaterally. IMPRESSION: Negative MRA neck Moderate left anterior cerebral artery stenosis and mild right anterior cerebral artery stenosis Mild stenosis distal PCA bilaterally. No intracranial large vessel occlusion. Electronically Signed    By: Franchot Gallo M.D.   On: 11/24/2021 17:36   MR ANGIO NECK WO CONTRAST  Result Date: 11/24/2021 CLINICAL DATA:  Stroke EXAM: MRA NECK WITHOUT CONTRAST MRA HEAD WITHOUT CONTRAST TECHNIQUE: Angiographic images of the Circle of Willis were acquired using MRA technique without intravenous contrast. COMPARISON:  MRI head 11/24/2021 FINDINGS: MRA NECK FINDINGS Carotid bifurcation widely patent without stenosis. Both vertebral arteries are patent with antegrade flow. No vertebral artery stenosis. MRA HEAD FINDINGS Internal carotid artery widely patent bilaterally without stenosis. Moderate stenosis left A2 segment. Mild stenosis right A2 segment. Middle cerebral arteries patent without stenosis. No vascular malformation Both vertebral arteries are patent to the basilar. Right PICA patent. Left PICA not visualized. AICA patent bilaterally. Basilar patent. Superior cerebellar and posterior cerebral arteries patent. Fetal origin right posterior cerebral artery. Mild stenosis distal right posterior cerebral artery. Mild stenosis distal posterior cerebral artery bilaterally. IMPRESSION: Negative MRA neck Moderate left anterior cerebral artery stenosis and mild right anterior cerebral artery stenosis Mild stenosis distal PCA bilaterally. No intracranial large vessel occlusion. Electronically Signed   By: Franchot Gallo M.D.   On: 11/24/2021 17:36   MR BRAIN WO CONTRAST  Result Date: 11/24/2021 CLINICAL DATA:  Stroke, follow up EXAM: MRI HEAD WITHOUT CONTRAST TECHNIQUE: Multiplanar, multiecho pulse sequences of the brain and surrounding structures were obtained without intravenous contrast. COMPARISON:  CT head from the same day.  MRI Sep 14, 2021. FINDINGS: Brain: In comparison to MRI from Sep 14, 2021, new/interval infarct in the left occipital lobe (left PCA territory) with a few  small areas of DWI hyperintensity in this region potentially representing evolving subacute infarct and/or artifact related to cortical  laminar necrosis/petechial hemorrhage described below. Areas of T1 hyperintensity and extensive curvilinear susceptibility effect is region is compatible with laminar necrosis and/or petechial hemorrhage. No mass occupying acute hemorrhage. There is edema in this region without significant mass effect. No midline shift. No hydrocephalus or extra-axial fluid collection. Additional moderate scattered T2/FLAIR hyperintensities in the white matter, nonspecific but compatible with chronic microvascular ischemic disease. Small remote right cerebellar lacunar infarcts. Vascular: Major arterial flow voids are maintained skull base. Skull and upper cervical spine: Normal marrow signal. Sinuses/Orbits: Largely clear sinuses.  No acute orbital findings. Other: No mastoid effusions. IMPRESSION: 1. In comparison to MRI from Sep 14, 2021, new/interval infarct in the left occipital lobe (left PCA territory), which is likely subacute in chronicity and described above. Associated cortical laminar necrosis and/or petechial hemorrhage. 2. Moderate chronic microvascular ischemic disease. Electronically Signed   By: Margaretha Sheffield M.D.   On: 11/24/2021 16:54   CT Head Wo Contrast  Result Date: 11/24/2021 CLINICAL DATA:  Stroke suspected EXAM: CT HEAD WITHOUT CONTRAST TECHNIQUE: Contiguous axial images were obtained from the base of the skull through the vertex without intravenous contrast. RADIATION DOSE REDUCTION: This exam was performed according to the departmental dose-optimization program which includes automated exposure control, adjustment of the mA and/or kV according to patient size and/or use of iterative reconstruction technique. COMPARISON:  09/14/2021 FINDINGS: Brain: Moderate area of hypodensity in the left occipital lobe and inferior parietal lobe, which is new from the prior exam, with effacement of the sulci. Possible faint curvilinear hyperdensity in this region may represent petechial hemorrhage. No parenchymal  hematoma. No mass, mass effect, or midline shift. No hydrocephalus or additional extra-axial collection. Vascular: No definite hyperdense vessel. Skull: No acute osseous abnormality. Sinuses/Orbits: No acute finding. Other: The mastoids are well aerated. IMPRESSION: Hypodensity in the left occipital and inferior parietal lobe is new from the prior exam and concerning for acute and/or subacute infarct. Possible faint curvilinear hyperdensity in this region may represent petechial hemorrhage. No intraparenchymal hematoma or significant midline shift. These results were called by telephone at the time of interpretation on 11/24/2021 at 1:52 pm to provider [facility] , who verbally acknowledged these results. Electronically Signed   By: Merilyn Baba M.D.   On: 11/24/2021 13:53   DG Chest 2 View  Result Date: 11/24/2021 CLINICAL DATA:  Syncopal episode today. EXAM: CHEST - 2 VIEW COMPARISON:  Chest radiograph 09/14/2021; CT chest 09/14/2021. FINDINGS: The heart size and mediastinal contours are within normal limits. Trace pleural fluid at the posterior costophrenic angle. A 1.4 cm nodular opacity projects over the anterior aspect of the distal thoracic spine, seen on lateral view only. No other focal consolidation. No pneumothorax. Multilevel degenerative changes in the mid to distal thoracic spine. IMPRESSION: A 1.4 cm nodular opacity projecting over the anterior distal thoracic spine could represent a pulmonary nodule versus overlapping osseous structures. Recommend CT chest for further evaluation. Trace pleural fluid at the posterior costophrenic angle. No other focal consolidation. Electronically Signed   By: Ileana Roup M.D.   On: 11/24/2021 13:19    Microbiology: Results for orders placed or performed during the hospital encounter of 11/24/21  Culture, blood (Routine X 2) w Reflex to ID Panel     Status: None   Collection Time: 11/24/21  9:34 PM   Specimen: BLOOD  Result Value Ref Range Status    Specimen  Description BLOOD LEFT ASSIST CONTROL  Final   Special Requests   Final    BOTTLES DRAWN AEROBIC AND ANAEROBIC Blood Culture adequate volume   Culture   Final    NO GROWTH 5 DAYS Performed at [facility], [address] [place], [place] [address]    Report Status 11/29/2021 FINAL  Final  Culture, blood (Routine X 2) w Reflex to ID Panel     Status: None   Collection Time: 11/24/21  9:40 PM   Specimen: BLOOD  Result Value Ref Range Status   Specimen Description BLOOD LEFT HAND  Final   Special Requests   Final    BOTTLES DRAWN AEROBIC AND ANAEROBIC Blood Culture adequate volume   Culture   Final    NO GROWTH 5 DAYS Performed at [facility], [address] [place], [place] [address]    Report Status 11/29/2021 FINAL  Final    Labs: CBC: Recent Labs  Lab 12/04/21 0755 12/05/21 0446 12/06/21 0605 12/07/21 0641 12/08/21 0638  WBC 6.9 7.8 6.5 7.0 7.8  NEUTROABS 4.4 4.3 3.9 4.0 4.6  HGB 9.1* 9.5* 9.5* 9.9* 10.2*  HCT 29.8* 31.0* 31.1* 32.2* 33.3*  MCV 91.1 90.1 90.9 91.0 91.0  PLT 286 288 273 285 970   Basic Metabolic Panel: Recent Labs  Lab 12/04/21 0755 12/05/21 0446 12/06/21 0605 12/07/21 0641 12/08/21 0638  NA 139 139 141 139 139  K 4.3 4.3 4.1 4.6 4.7  CL 113* 111 115* 110 110  CO2 '23 23 23 24 24  '$ GLUCOSE 92 87 80 86 95  BUN '18 18 22 '$ 26* 27*  CREATININE 1.14 1.12 1.14 1.34* 1.24  CALCIUM 8.6* 8.7* 8.7* 8.9 9.1  MG 2.0 2.0 2.2 2.3 2.2  PHOS 4.0 3.5 4.2 4.5 4.9*   Liver Function Tests: Recent Labs  Lab 12/04/21 0755 12/05/21 0446 12/06/21 0605 12/07/21 0641 12/08/21 0638  AST 30 46* 36 31 36  ALT 22 33 '29 26 26  '$ ALKPHOS 50 52 50 52 52  BILITOT 0.5 0.7 0.4 0.6 0.5  PROT 6.2* 6.5 6.3* 6.5 6.8  ALBUMIN 2.8* 2.9* 2.8* 3.0* 3.2*   CBG: No results for input(s): "GLUCAP" in the last 168 hours.  Discharge time spent: greater than 30 minutes.  Signed: Fritzi Mandes, MD [facility] 12/08/2021

## 2021-12-08 NOTE — Progress Notes (Signed)
Inpatient Rehab Admissions Coordinator:   Discussed with OT and note improvement with PT yesterday.  Pt does not require intensive rehab with needs in at least 2 therapy disciplines.  With withdraw [facility] case in favor of home with home health as per OT recommendations.  CIR will sign off at this time.   Shann Medal, PT, DPT Admissions Coordinator [phone] 12/08/21  1:11 PM

## 2021-12-08 NOTE — Progress Notes (Signed)
Occupational Therapy Treatment Patient Details Name: Mark [name] MRN: [id number] DOB: December 12, 1940 Today's Date: 12/08/2021   History of present illness Mark [name] is a 81 y.o. African-American male with medical history significant for coronary artery disease, hypertension, dyslipidemia and CVA, was discharged on 8/16 after having a day hospitalization for CVA complicated with DVT and given petechial hemorrhagic transformation the patient was placed on IVC filter given brief contraindication to anticoagulation done.  He presents today with right leg pain without significant swelling and mild dyspnea as well as mild chest pain that started earlier this morning when he woke up.   OT comments  Upon entering the room, pt seated in recliner chair and agreeable to OT intervention. Pt oriented to self and location only. Pt stands with supervision and cuing for hand placement and technique with RW and ambulates to sink. Pt stands for ~ 9 minutes without B UE support and washing face and brushes teeth with increased time and cuing for sequencing. 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 Once returning to room, pt's wife arrives and OT discussed change in recommendation to [facility] services as pt is at supervision level. 24/7 supervision recommended at discharge secondary to cognition and vision deficits. Mod multimodal cuing needed throughout session for sequencing and safety awareness. Pt seated in recliner chair with chair alarm activated and all needs within reach.    Recommendations for follow up therapy are one component of a multi-disciplinary discharge planning process, led by the attending physician.  Recommendations may be updated based on patient status, additional functional criteria and insurance authorization.    Follow Up Recommendations  Home health OT    Assistance Recommended at Discharge Frequent or constant  Supervision/Assistance  Patient can return home with the following  Assistance with cooking/housework;Direct supervision/assist for financial management;Direct supervision/assist for medications management;Assist for transportation;Help with stairs or ramp for entrance   Equipment Recommendations  Hospital bed       Precautions / Restrictions Precautions Precautions: Fall Restrictions Weight Bearing Restrictions: No       Mobility Bed Mobility               General bed mobility comments: Pt seated in recliner chair at beginning/end of session    Transfers Overall transfer level: Needs assistance Equipment used: Rolling walker (2 wheels) Transfers: Sit to/from Stand Sit to Stand: Supervision           General transfer comment: cuing for hand placement and technique     Balance Overall balance assessment: Needs assistance Sitting-balance support: Feet supported Sitting balance-Leahy Scale: Good Sitting balance - Comments: good static sitting balance   Standing balance support: Reliant on assistive device for balance, During functional activity, Bilateral upper extremity supported Standing balance-Leahy Scale: Good                             ADL either performed or assessed with clinical judgement   ADL Overall ADL's : Independent;Needs assistance/impaired     Grooming: Wash/dry hands;Wash/dry face;Oral care;Standing;Supervision/safety           Upper Body Dressing : Set up;Supervision/safety;Sitting                           Vision Patient Visual Report: Diplopia Additional Comments: 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 Pt is better mid  day than morning or later evening per wife. Pt follows commands with mod multimodal cuing.                   Pertinent Vitals/ Pain       Pain Assessment Pain Assessment: Faces Faces Pain Scale: No hurt         Frequency  Min 3X/week        Progress Toward Goals  OT Goals(current goals can now be found in the care plan section)  Progress towards OT goals: Progressing toward goals  Acute Rehab OT Goals Patient Stated Goal: return to PLOF OT Goal Formulation: With patient/family Time For Goal Achievement: 12/20/21 Potential to Achieve Goals: [place] Frequency remains appropriate;Discharge plan needs to be updated       AM-PAC OT "6 Clicks" Daily Activity     Outcome Measure   Help from another person eating meals?: None Help from another person taking care of personal grooming?: None Help from another person toileting, which includes using toliet, bedpan, or urinal?: A Little Help from another person bathing (including washing, rinsing, drying)?: A Little Help from another person to put on and taking off regular upper body clothing?: None Help from another person to put on and taking off regular lower body clothing?: A Little 6 Click Score: 21    End of Session Equipment Utilized During Treatment: Rolling walker (2 wheels)  OT Visit Diagnosis: Unsteadiness on feet (R26.81);Pain;Muscle weakness (generalized) (M62.81)   Activity Tolerance Patient tolerated treatment well   Patient Left in chair;with call bell/phone within reach;with chair alarm set;with family/visitor present   Nurse Communication Mobility status        Time: 1051-1120 OT Time Calculation (min): 29 min  Charges: OT General Charges $OT Visit: 1 Visit OT Treatments $Self Care/Home Management : 8-22 mins $Therapeutic Activity: 8-22 mins Darleen Crocker, MS, OTR/L , CBIS ascom [phone]  12/08/21, 11:57 AM

## 2021-12-09 DIAGNOSIS — N179 Acute kidney failure, unspecified: Secondary | ICD-10-CM | POA: Diagnosis not present

## 2021-12-09 DIAGNOSIS — Z8673 Personal history of transient ischemic attack (TIA), and cerebral infarction without residual deficits: Secondary | ICD-10-CM | POA: Diagnosis not present

## 2021-12-09 DIAGNOSIS — N1831 Chronic kidney disease, stage 3a: Secondary | ICD-10-CM | POA: Diagnosis not present

## 2021-12-09 DIAGNOSIS — I82453 Acute embolism and thrombosis of peroneal vein, bilateral: Secondary | ICD-10-CM | POA: Diagnosis not present

## 2021-12-09 DIAGNOSIS — Z9181 History of falling: Secondary | ICD-10-CM | POA: Diagnosis not present

## 2021-12-09 DIAGNOSIS — D631 Anemia in chronic kidney disease: Secondary | ICD-10-CM | POA: Diagnosis not present

## 2021-12-09 DIAGNOSIS — I82443 Acute embolism and thrombosis of tibial vein, bilateral: Secondary | ICD-10-CM | POA: Diagnosis not present

## 2021-12-09 DIAGNOSIS — G319 Degenerative disease of nervous system, unspecified: Secondary | ICD-10-CM | POA: Diagnosis not present

## 2021-12-09 DIAGNOSIS — I2699 Other pulmonary embolism without acute cor pulmonale: Secondary | ICD-10-CM | POA: Diagnosis not present

## 2021-12-09 DIAGNOSIS — Z7901 Long term (current) use of anticoagulants: Secondary | ICD-10-CM | POA: Diagnosis not present

## 2021-12-09 DIAGNOSIS — M25512 Pain in left shoulder: Secondary | ICD-10-CM | POA: Diagnosis not present

## 2021-12-09 DIAGNOSIS — I82412 Acute embolism and thrombosis of left femoral vein: Secondary | ICD-10-CM | POA: Diagnosis not present

## 2021-12-09 DIAGNOSIS — G9341 Metabolic encephalopathy: Secondary | ICD-10-CM | POA: Diagnosis not present

## 2021-12-09 DIAGNOSIS — G629 Polyneuropathy, unspecified: Secondary | ICD-10-CM | POA: Diagnosis not present

## 2021-12-09 DIAGNOSIS — I639 Cerebral infarction, unspecified: Secondary | ICD-10-CM | POA: Diagnosis not present

## 2021-12-09 DIAGNOSIS — I080 Rheumatic disorders of both mitral and aortic valves: Secondary | ICD-10-CM | POA: Diagnosis not present

## 2021-12-09 DIAGNOSIS — I1310 Hypertensive heart and chronic kidney disease without heart failure, with stage 1 through stage 4 chronic kidney disease, or unspecified chronic kidney disease: Secondary | ICD-10-CM | POA: Diagnosis not present

## 2021-12-09 DIAGNOSIS — I700 Atherosclerosis of aorta: Secondary | ICD-10-CM | POA: Diagnosis not present

## 2021-12-09 DIAGNOSIS — I2510 Atherosclerotic heart disease of native coronary artery without angina pectoris: Secondary | ICD-10-CM | POA: Diagnosis not present

## 2021-12-14 DIAGNOSIS — Z9181 History of falling: Secondary | ICD-10-CM | POA: Diagnosis not present

## 2021-12-14 DIAGNOSIS — N1831 Chronic kidney disease, stage 3a: Secondary | ICD-10-CM | POA: Diagnosis not present

## 2021-12-14 DIAGNOSIS — M25512 Pain in left shoulder: Secondary | ICD-10-CM | POA: Diagnosis not present

## 2021-12-14 DIAGNOSIS — G319 Degenerative disease of nervous system, unspecified: Secondary | ICD-10-CM | POA: Diagnosis not present

## 2021-12-14 DIAGNOSIS — I82412 Acute embolism and thrombosis of left femoral vein: Secondary | ICD-10-CM | POA: Diagnosis not present

## 2021-12-14 DIAGNOSIS — N179 Acute kidney failure, unspecified: Secondary | ICD-10-CM | POA: Diagnosis not present

## 2021-12-14 DIAGNOSIS — Z8673 Personal history of transient ischemic attack (TIA), and cerebral infarction without residual deficits: Secondary | ICD-10-CM | POA: Diagnosis not present

## 2021-12-14 DIAGNOSIS — I080 Rheumatic disorders of both mitral and aortic valves: Secondary | ICD-10-CM | POA: Diagnosis not present

## 2021-12-14 DIAGNOSIS — G9341 Metabolic encephalopathy: Secondary | ICD-10-CM | POA: Diagnosis not present

## 2021-12-14 DIAGNOSIS — I1310 Hypertensive heart and chronic kidney disease without heart failure, with stage 1 through stage 4 chronic kidney disease, or unspecified chronic kidney disease: Secondary | ICD-10-CM | POA: Diagnosis not present

## 2021-12-14 DIAGNOSIS — I700 Atherosclerosis of aorta: Secondary | ICD-10-CM | POA: Diagnosis not present

## 2021-12-14 DIAGNOSIS — I2699 Other pulmonary embolism without acute cor pulmonale: Secondary | ICD-10-CM | POA: Diagnosis not present

## 2021-12-14 DIAGNOSIS — I82453 Acute embolism and thrombosis of peroneal vein, bilateral: Secondary | ICD-10-CM | POA: Diagnosis not present

## 2021-12-14 DIAGNOSIS — Z7901 Long term (current) use of anticoagulants: Secondary | ICD-10-CM | POA: Diagnosis not present

## 2021-12-14 DIAGNOSIS — I2510 Atherosclerotic heart disease of native coronary artery without angina pectoris: Secondary | ICD-10-CM | POA: Diagnosis not present

## 2021-12-14 DIAGNOSIS — D631 Anemia in chronic kidney disease: Secondary | ICD-10-CM | POA: Diagnosis not present

## 2021-12-14 DIAGNOSIS — G629 Polyneuropathy, unspecified: Secondary | ICD-10-CM | POA: Diagnosis not present

## 2021-12-14 DIAGNOSIS — I82443 Acute embolism and thrombosis of tibial vein, bilateral: Secondary | ICD-10-CM | POA: Diagnosis not present

## 2021-12-15 DIAGNOSIS — I82403 Acute embolism and thrombosis of unspecified deep veins of lower extremity, bilateral: Secondary | ICD-10-CM | POA: Diagnosis not present

## 2021-12-15 DIAGNOSIS — E7800 Pure hypercholesterolemia, unspecified: Secondary | ICD-10-CM | POA: Diagnosis not present

## 2021-12-15 DIAGNOSIS — E43 Unspecified severe protein-calorie malnutrition: Secondary | ICD-10-CM | POA: Diagnosis not present

## 2021-12-15 DIAGNOSIS — M5124 Other intervertebral disc displacement, thoracic region: Secondary | ICD-10-CM | POA: Diagnosis not present

## 2021-12-15 DIAGNOSIS — I6930 Unspecified sequelae of cerebral infarction: Secondary | ICD-10-CM | POA: Diagnosis not present

## 2021-12-15 DIAGNOSIS — I700 Atherosclerosis of aorta: Secondary | ICD-10-CM | POA: Diagnosis not present

## 2021-12-15 DIAGNOSIS — M5126 Other intervertebral disc displacement, lumbar region: Secondary | ICD-10-CM | POA: Diagnosis not present

## 2021-12-15 DIAGNOSIS — I10 Essential (primary) hypertension: Secondary | ICD-10-CM | POA: Diagnosis not present

## 2021-12-15 DIAGNOSIS — H53459 Other localized visual field defect, unspecified eye: Secondary | ICD-10-CM | POA: Diagnosis not present

## 2021-12-15 DIAGNOSIS — I2699 Other pulmonary embolism without acute cor pulmonale: Secondary | ICD-10-CM | POA: Diagnosis not present

## 2021-12-17 ENCOUNTER — Other Ambulatory Visit (INDEPENDENT_AMBULATORY_CARE_PROVIDER_SITE_OTHER): Payer: Self-pay | Admitting: Vascular Surgery

## 2021-12-17 DIAGNOSIS — M25512 Pain in left shoulder: Secondary | ICD-10-CM | POA: Diagnosis not present

## 2021-12-17 DIAGNOSIS — Z9181 History of falling: Secondary | ICD-10-CM | POA: Diagnosis not present

## 2021-12-17 DIAGNOSIS — I2699 Other pulmonary embolism without acute cor pulmonale: Secondary | ICD-10-CM | POA: Diagnosis not present

## 2021-12-17 DIAGNOSIS — I700 Atherosclerosis of aorta: Secondary | ICD-10-CM | POA: Diagnosis not present

## 2021-12-17 DIAGNOSIS — I82412 Acute embolism and thrombosis of left femoral vein: Secondary | ICD-10-CM | POA: Diagnosis not present

## 2021-12-17 DIAGNOSIS — I1310 Hypertensive heart and chronic kidney disease without heart failure, with stage 1 through stage 4 chronic kidney disease, or unspecified chronic kidney disease: Secondary | ICD-10-CM | POA: Diagnosis not present

## 2021-12-17 DIAGNOSIS — Z7901 Long term (current) use of anticoagulants: Secondary | ICD-10-CM | POA: Diagnosis not present

## 2021-12-17 DIAGNOSIS — G9341 Metabolic encephalopathy: Secondary | ICD-10-CM | POA: Diagnosis not present

## 2021-12-17 DIAGNOSIS — D631 Anemia in chronic kidney disease: Secondary | ICD-10-CM | POA: Diagnosis not present

## 2021-12-17 DIAGNOSIS — I82453 Acute embolism and thrombosis of peroneal vein, bilateral: Secondary | ICD-10-CM | POA: Diagnosis not present

## 2021-12-17 DIAGNOSIS — Z95828 Presence of other vascular implants and grafts: Secondary | ICD-10-CM

## 2021-12-17 DIAGNOSIS — G629 Polyneuropathy, unspecified: Secondary | ICD-10-CM | POA: Diagnosis not present

## 2021-12-17 DIAGNOSIS — N179 Acute kidney failure, unspecified: Secondary | ICD-10-CM | POA: Diagnosis not present

## 2021-12-17 DIAGNOSIS — I82443 Acute embolism and thrombosis of tibial vein, bilateral: Secondary | ICD-10-CM | POA: Diagnosis not present

## 2021-12-17 DIAGNOSIS — Z8673 Personal history of transient ischemic attack (TIA), and cerebral infarction without residual deficits: Secondary | ICD-10-CM | POA: Diagnosis not present

## 2021-12-17 DIAGNOSIS — I2510 Atherosclerotic heart disease of native coronary artery without angina pectoris: Secondary | ICD-10-CM | POA: Diagnosis not present

## 2021-12-17 DIAGNOSIS — N1831 Chronic kidney disease, stage 3a: Secondary | ICD-10-CM | POA: Diagnosis not present

## 2021-12-17 DIAGNOSIS — I080 Rheumatic disorders of both mitral and aortic valves: Secondary | ICD-10-CM | POA: Diagnosis not present

## 2021-12-17 DIAGNOSIS — G319 Degenerative disease of nervous system, unspecified: Secondary | ICD-10-CM | POA: Diagnosis not present

## 2021-12-17 DIAGNOSIS — I80233 Phlebitis and thrombophlebitis of tibial vein, bilateral: Secondary | ICD-10-CM

## 2021-12-18 ENCOUNTER — Ambulatory Visit (INDEPENDENT_AMBULATORY_CARE_PROVIDER_SITE_OTHER): Payer: Medicare HMO

## 2021-12-18 ENCOUNTER — Ambulatory Visit (INDEPENDENT_AMBULATORY_CARE_PROVIDER_SITE_OTHER): Payer: Medicare HMO | Admitting: Vascular Surgery

## 2021-12-18 ENCOUNTER — Encounter (INDEPENDENT_AMBULATORY_CARE_PROVIDER_SITE_OTHER): Payer: Self-pay | Admitting: Vascular Surgery

## 2021-12-18 VITALS — BP 97/58 | HR 77 | Resp 17

## 2021-12-18 DIAGNOSIS — I2699 Other pulmonary embolism without acute cor pulmonale: Secondary | ICD-10-CM

## 2021-12-18 DIAGNOSIS — I10 Essential (primary) hypertension: Secondary | ICD-10-CM

## 2021-12-18 DIAGNOSIS — I82433 Acute embolism and thrombosis of popliteal vein, bilateral: Secondary | ICD-10-CM | POA: Diagnosis not present

## 2021-12-18 DIAGNOSIS — Z95828 Presence of other vascular implants and grafts: Secondary | ICD-10-CM

## 2021-12-18 DIAGNOSIS — I82412 Acute embolism and thrombosis of left femoral vein: Secondary | ICD-10-CM

## 2021-12-18 DIAGNOSIS — I80233 Phlebitis and thrombophlebitis of tibial vein, bilateral: Secondary | ICD-10-CM | POA: Diagnosis not present

## 2021-12-18 NOTE — Assessment & Plan Note (Signed)
His duplex today shows an age-indeterminate DVT in the right posterior tibial vein only which is a significant improvement from his previous DVT studies.  He is now tolerating anticoagulation.  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  We will look to get this scheduled in the next week or 2.  This was discussed with the patient and his wife.

## 2021-12-18 NOTE — Assessment & Plan Note (Signed)
blood pressure control important in reducing the progression of atherosclerotic disease. On appropriate oral medications.  

## 2021-12-18 NOTE — Assessment & Plan Note (Signed)
Tolerating anticoagulation.

## 2021-12-18 NOTE — Patient Instructions (Signed)
Inferior Vena Cava Filter Removal  Inferior vena cava filter removal is a procedure to take out a metal filter that was placed into a large vein in the abdomen (inferior vena cava, or IVC). An IVC filter prevents blood clots in the legs or pelvis from traveling to the lungs. Some IVC filters are designed to be removed (retrievable filters). You may have your filter removed when the danger of forming blood clots has passed or when you can take blood-thinning medicine to prevent blood clots. In some cases, the filter may need to be removed because it becomes damaged, is not working, or is causing problems. Most filters can be removed through the vein (percutaneously). In the rare cases when the health care provider is unable to remove the filter percutaneously, one of these steps may be taken: A more invasive, open surgery. The filter may be left in place. Tell a health care provider about: Any allergies you have, including iodine. All medicines you are taking, including vitamins, herbs, eye drops, creams, and over-the-counter medicines. Any problems you or family members have had with anesthetic medicines or with contrast dyes that are used during imaging tests. Any bleeding problems you have. Any surgeries you have had. Any medical conditions you have. Whether you are pregnant or may be pregnant. What are the risks? Generally, this is a safe procedure. However, serious problems may occur, especially if the filter has been in for more than a few months. Possible problems include: Infection. Bleeding. Allergic reactions to medicines or dyes. Damage to the IVC, other blood vessels, or nearby structures. A blood clot or a piece of the filter breaking loose and traveling to the lungs. What happens before the procedure? When to stop eating and drinking Follow instructions from your health care provider about what you may eat and drink before your procedure. These may include: 8 hours before your  procedure Stop eating most foods. Do not eat meat, fried foods, or fatty foods. Eat only light foods, such as toast or crackers. All liquids are okay except energy drinks and alcohol. 6 hours before your procedure Stop eating. Drink only clear liquids, such as water, clear fruit juice, black coffee, plain tea, and sports drinks. Do not drink energy drinks or alcohol. 2 hours before your procedure Stop drinking all liquids. You may be allowed to take medicines with small sips of water. If you do not follow your health care provider's instructions, your procedure may be delayed or canceled. Medicines Ask your health care provider about: Changing or stopping your regular medicines. This is especially important if you are taking diabetes medicines or blood thinners. Taking medicines such as aspirin and ibuprofen. These medicines can thin your blood. Do not take these medicines unless your health care provider tells you to take them. Taking over-the-counter medicines, vitamins, herbs, and supplements. General instructions Do not use any products that contain nicotine or tobacco for at least 4 weeks before the procedure. These products include cigarettes, chewing tobacco, and vaping devices, such as e-cigarettes. If you need help quitting, ask your health care provider. Ask your health care provider: How your surgery site will be marked. What steps will be taken to help prevent infection. These steps may include: Removing hair at the surgery site. Washing skin with a germ-killing soap. Receiving antibiotic medicine. If you will be going home right after the procedure, plan to have a responsible adult: Take you home from the hospital or clinic. You will not be allowed to drive. Care for   you for the time you are told. What happens during the procedure? An IV will be inserted into one of your veins. You will be given one or more of the following: A medicine to help you relax (sedative). A  medicine to numb the area (local anesthetic). A medicine to make you fall asleep (general anesthetic). The procedure will be done through a vein in your groin or neck that leads to the IVC. A small incision will be made over the vein. A long, thin tube (catheter) will be inserted into the vein. The catheter will be moved through your vein and into your IVC. X-rays may be done to help guide the catheter into place. Dye may be injected through the catheter before the X-rays to make the catheter and filter easier to see. When the catheter reaches the filter, a hook (snare) on the end of the catheter may be used to latch on to the filter. In some cases, a grasping instrument (forceps) may be threaded through the catheter to gently grab and remove the filter instead. After the filter has been hooked or grasped, the filter and instruments will be pulled out through the catheter. The catheter will be removed through the insertion site in your skin. Pressure will be placed over your insertion site until bleeding stops. A bandage (dressing) will be placed over the catheter insertion site. The procedure may vary among health care providers and hospitals. What happens after the procedure? Your blood pressure, heart rate, breathing rate, and blood oxygen level will be monitored until you leave the hospital or clinic. You may need to stay in bed (be on bed rest) for a period of time. Your insertion site will be monitored for the first few hours for any signs of bleeding. If you were given a sedative during the procedure, it can affect you for several hours. Do not drive or operate machinery until your health care provider says that it is safe. Summary Inferior vena cava (IVC) filter removal is a procedure to take out a filter that was placed to prevent blood clots from traveling to your lungs. You may have your filter removed when the danger of forming blood clots has passed or when you can take  blood-thinning medicines to prevent blood clots. In some cases, a filter is removed because there is a problem with it. A long, thin tube (catheter) will be inserted through a vein in your neck or groin. Then, the filter will be gently grasped and pulled out through the catheter. Plan to have a responsible adult care for you for the time you are told. This information is not intended to replace advice given to you by your health care provider. Make sure you discuss any questions you have with your health care provider. Document Revised: 04/21/2021 Document Reviewed: 04/21/2021 Elsevier Patient Education  2023 [employer]  

## 2021-12-18 NOTE — Progress Notes (Signed)
MRN : [id number]  Mark [name] is a 81 y.o. ([birth year]) male who presents with chief complaint of  Chief Complaint  Patient presents with   Establish Care    Referred by  Sun  .  History of Present Illness: Patient returns today in follow up of his DVT with an intracranial hemorrhage.  This was about a month ago.  He was not able to take anticoagulation at that time, we placed an IVC filter early last month.  Since then, he is done fairly well.  He has now resumed anticoagulation and seems to be tolerating this okay.  His duplex today shows an age-indeterminate DVT in the right posterior tibial vein only which is a significant improvement from his previous DVT studies.  His swelling has gone down and he really is not having much swelling now.  He does have occasional pain in both legs but it seems that a lot of this is probably arthritic.  Current Outpatient Medications  Medication Sig Dispense Refill   acetaminophen (TYLENOL) 325 MG tablet Take 2 tablets (650 mg total) by mouth every 4 (four) hours as needed for mild pain (or temp > 37.5 C (99.5 F)).     amLODipine (NORVASC) 10 MG tablet Take 1 tablet (10 mg total) by mouth daily. 30 tablet 1   apixaban (ELIQUIS) 5 MG TABS tablet Two tabs po wice a day for one week then one tab po twice a day afterwards 74 tablet 0   aspirin EC 81 MG tablet Take 1 tablet (81 mg total) by mouth daily. Swallow whole. 30 tablet 0   gabapentin (NEURONTIN) 100 MG capsule Take 1 capsule (100 mg total) by mouth at bedtime. 30 capsule 1   lisinopril (ZESTRIL) 40 MG tablet Take 1 tablet (40 mg total) by mouth daily. 90 tablet 1   polyethylene glycol (MIRALAX / GLYCOLAX) 17 g packet Take 17 g by mouth daily. 30 each 0   rosuvastatin (CRESTOR) 40 MG tablet Take 1 tablet (40 mg total) by mouth daily. 30 tablet 1   terazosin (HYTRIN) 5 MG capsule Take 1 capsule (5 mg total) by mouth at bedtime. 30 capsule 1   No current facility-administered medications for this  visit.    Past Medical History:  Diagnosis Date   Arthritis    Lower Back, Hips, and Hands.   Cancer [facility])    Prostate CA s/o Rad Tx   Chronic kidney disease    Coronary artery disease    Hypercholesterolemia    Hypertension    Radiation 09/30/2008-12/02/2010   7800 cGy  Dr.Ottelin   Stroke [facility])     Past Surgical History:  Procedure Laterality Date   CATARACT SURGERY     IVC FILTER INSERTION N/A 11/25/2021   Procedure: IVC FILTER INSERTION;  Surgeon: Algernon Huxley, MD;  Location: [facility];  Service: Cardiovascular;  Laterality: N/A;   KNEE ARTHROSCOPY Left    QUADRICEPS TENDON REPAIR Right 12/15/2020   Procedure: REPAIR QUADRICEP TENDON;  Surgeon: Lovell Sheehan, MD;  Location: [facility] ORS;  Service: Orthopedics;  Laterality: Right;     Social History   Tobacco Use   Smoking status: Never   Smokeless tobacco: Never  Vaping Use   Vaping Use: Never used  Substance Use Topics   Alcohol use: No   Drug use: No      Family History  Problem Relation Age of Onset   Hypertension Father    Healthy Sister  Allergies  Allergen Reactions   Atorvastatin Other (See Comments)     REVIEW OF SYSTEMS (Negative unless checked)  Constitutional: '[]'$ Weight loss  '[]'$ Fever  '[]'$ Chills Cardiac: '[]'$ Chest pain   '[]'$ Chest pressure   '[]'$ Palpitations   '[]'$ Shortness of breath when laying flat   '[]'$ Shortness of breath at rest   '[]'$ Shortness of breath with exertion. Vascular:  '[]'$ Pain in legs with walking   '[]'$ Pain in legs at rest   '[]'$ Pain in legs when laying flat   '[]'$ Claudication   '[]'$ Pain in feet when walking  '[]'$ Pain in feet at rest  '[]'$ Pain in feet when laying flat   '[x]'$ History of DVT   '[x]'$ Phlebitis   '[x]'$ Swelling in legs   '[]'$ Varicose veins   '[]'$ Non-healing ulcers Pulmonary:   '[]'$ Uses home oxygen   '[]'$ Productive cough   '[]'$ Hemoptysis   '[]'$ Wheeze  '[]'$ COPD   '[]'$ Asthma Neurologic:  '[]'$ Dizziness  '[]'$ Blackouts   '[]'$ Seizures   '[x]'$ History of stroke   '[]'$ History of TIA  '[]'$ Aphasia   '[]'$ Temporary blindness    '[]'$ Dysphagia   '[]'$ Weakness or numbness in arms   '[]'$ Weakness or numbness in legs Musculoskeletal:  '[x]'$ Arthritis   '[]'$ Joint swelling   '[]'$ Joint pain   '[]'$ Low back pain Hematologic:  '[]'$ Easy bruising  '[]'$ Easy bleeding   '[]'$ Hypercoagulable state   '[]'$ Anemic   Gastrointestinal:  '[]'$ Blood in stool   '[]'$ Vomiting blood  '[]'$ Gastroesophageal reflux/heartburn   '[]'$ Abdominal pain Genitourinary:  '[x]'$ Chronic kidney disease   '[]'$ Difficult urination  '[]'$ Frequent urination  '[]'$ Burning with urination   '[]'$ Hematuria Skin:  '[]'$ Rashes   '[]'$ Ulcers   '[]'$ Wounds Psychological:  '[]'$ History of anxiety   '[]'$  History of major depression.  Physical Examination  BP (!) 97/58 (BP Location: Left Arm)   Pulse 77   Resp 17  Gen:  WD/WN, NAD Head: [place]/AT, No temporalis wasting. Ear/Nose/Throat: Hearing grossly intact, nares w/o erythema or drainage Eyes: Conjunctiva clear. Sclera non-icteric Neck: Supple.  Trachea midline Pulmonary:  Good air movement, no use of accessory muscles.  Cardiac: RRR, no JVD Vascular:  Vessel Right Left  Radial Palpable Palpable           Musculoskeletal: M/S 5/5 throughout.  No deformity or atrophy.  No significant lower extremity edema. Neurologic: Sensation grossly intact in extremities.  Symmetrical.  Speech is fluent.  Psychiatric: Judgment intact, Mood & affect appropriate for pt's clinical situation. Dermatologic: No rashes or ulcers noted.  No cellulitis or open wounds.      Labs Recent Results (from the past 2160 hour(s))  Basic metabolic panel     Status: Abnormal   Collection Time: 10/16/21  5:17 PM  Result Value Ref Range   Sodium 140 135 - 145 mmol/L   Potassium 2.5 (LL) 3.5 - 5.1 mmol/L    Comment: CRITICAL RESULT CALLED TO, READ BACK BY AND VERIFIED WITH REINA GALJOUR 10/16/21 @ 1804 BY SB    Chloride 102 98 - 111 mmol/L   CO2 29 22 - 32 mmol/L   Glucose, Bld 95 70 - 99 mg/dL    Comment: Glucose reference range applies only to samples taken after fasting for at least 8 hours.   BUN 9  8 - 23 mg/dL   Creatinine, Ser 1.08 0.61 - 1.24 mg/dL   Calcium 8.3 (L) 8.9 - 10.3 mg/dL   GFR, Estimated >60 >60 mL/min    Comment: (NOTE) Calculated using the CKD-EPI Creatinine Equation (2021)    Anion gap 9 5 - 15    Comment: Performed at [facility], [address], [place], [place] [address]  CBC with Differential     Status: Abnormal   Collection Time: 10/16/21  5:17 PM  Result Value Ref Range   WBC 9.5 4.0 - 10.5 K/uL   RBC 3.34 (L) 4.22 - 5.81 MIL/uL   Hemoglobin 10.0 (L) 13.0 - 17.0 g/dL   HCT 33.5 (L) 39.0 - 52.0 %   MCV 100.3 (H) 80.0 - 100.0 fL   MCH 29.9 26.0 - 34.0 pg   MCHC 29.9 (L) 30.0 - 36.0 g/dL   RDW 18.0 (H) 11.5 - 15.5 %   Platelets 257 150 - 400 K/uL   nRBC 0.0 0.0 - 0.2 %   Neutrophils Relative % 69 %   Neutro Abs 6.6 1.7 - 7.7 K/uL   Lymphocytes Relative 19 %   Lymphs Abs 1.8 0.7 - 4.0 K/uL   Monocytes Relative 9 %   Monocytes Absolute 0.9 0.1 - 1.0 K/uL   Eosinophils Relative 1 %   Eosinophils Absolute 0.1 0.0 - 0.5 K/uL   Basophils Relative 1 %   Basophils Absolute 0.1 0.0 - 0.1 K/uL   Immature Granulocytes 1 %   Abs Immature Granulocytes 0.05 0.00 - 0.07 K/uL    Comment: Performed at [facility], [address] [place], [place] [address]  Protime-INR     Status: None   Collection Time: 10/16/21  5:17 PM  Result Value Ref Range   Prothrombin Time 13.8 11.4 - 15.2 seconds   INR 1.1 0.8 - 1.2    Comment: (NOTE) INR goal varies based on device and disease states. Performed at [facility], [address] [place], [place] [address]   Magnesium     Status: Abnormal   Collection Time: 10/16/21  5:17 PM  Result Value Ref Range   Magnesium 1.6 (L) 1.7 - 2.4 mg/dL    Comment: Performed at [facility], [address] [place], [place] [address]  Urinalysis, Routine w reflex microscopic     Status: Abnormal   Collection Time: 11/22/21 10:28 AM  Result Value Ref Range   Color, Urine YELLOW (A) YELLOW    APPearance CLEAR (A) CLEAR   Specific Gravity, Urine 1.010 1.005 - 1.030   pH 7.0 5.0 - 8.0   Glucose, UA NEGATIVE NEGATIVE mg/dL   Hgb urine dipstick NEGATIVE NEGATIVE   Bilirubin Urine NEGATIVE NEGATIVE   Ketones, ur NEGATIVE NEGATIVE mg/dL   Protein, ur NEGATIVE NEGATIVE mg/dL   Nitrite NEGATIVE NEGATIVE   Leukocytes,Ua NEGATIVE NEGATIVE    Comment: Performed at [facility], [address] [place], [place] [address]  Basic metabolic panel     Status: Abnormal   Collection Time: 11/22/21 11:28 AM  Result Value Ref Range   Sodium 141 135 - 145 mmol/L   Potassium 4.3 3.5 - 5.1 mmol/L   Chloride 110 98 - 111 mmol/L   CO2 25 22 - 32 mmol/L   Glucose, Bld 96 70 - 99 mg/dL    Comment: Glucose reference range applies only to samples taken after fasting for at least 8 hours.   BUN 13 8 - 23 mg/dL   Creatinine, Ser 1.25 (H) 0.61 - 1.24 mg/dL   Calcium 8.9 8.9 - 10.3 mg/dL   GFR, Estimated 58 (L) >60 mL/min    Comment: (NOTE) Calculated using the CKD-EPI Creatinine Equation (2021)    Anion gap 6 5 - 15    Comment: Performed at [facility], [address], [place], [place] [address]  CBC     Status:  Abnormal   Collection Time: 11/22/21 11:28 AM  Result Value Ref Range   WBC 8.5 4.0 - 10.5 K/uL   RBC 3.48 (L) 4.22 - 5.81 MIL/uL   Hemoglobin 9.9 (L) 13.0 - 17.0 g/dL   HCT 32.0 (L) 39.0 - 52.0 %   MCV 92.0 80.0 - 100.0 fL   MCH 28.4 26.0 - 34.0 pg   MCHC 30.9 30.0 - 36.0 g/dL   RDW 13.1 11.5 - 15.5 %   Platelets 298 150 - 400 K/uL   nRBC 0.0 0.0 - 0.2 %    Comment: Performed at [facility], [address] [place], [place] [address]  Basic metabolic panel     Status: Abnormal   Collection Time: 11/24/21  9:33 AM  Result Value Ref Range   Sodium 140 135 - 145 mmol/L   Potassium 4.2 3.5 - 5.1 mmol/L   Chloride 105 98 - 111 mmol/L   CO2 25 22 - 32 mmol/L   Glucose, Bld 100 (H) 70 - 99 mg/dL    Comment: Glucose reference range applies only to  samples taken after fasting for at least 8 hours.   BUN 20 8 - 23 mg/dL   Creatinine, Ser 1.65 (H) 0.61 - 1.24 mg/dL   Calcium 9.5 8.9 - 10.3 mg/dL   GFR, Estimated 42 (L) >60 mL/min    Comment: (NOTE) Calculated using the CKD-EPI Creatinine Equation (2021)    Anion gap 10 5 - 15    Comment: Performed at [facility], [address] [place], [place] [address]  CBC     Status: Abnormal   Collection Time: 11/24/21  9:33 AM  Result Value Ref Range   WBC 10.1 4.0 - 10.5 K/uL   RBC 3.75 (L) 4.22 - 5.81 MIL/uL   Hemoglobin 10.5 (L) 13.0 - 17.0 g/dL   HCT 34.8 (L) 39.0 - 52.0 %   MCV 92.8 80.0 - 100.0 fL   MCH 28.0 26.0 - 34.0 pg   MCHC 30.2 30.0 - 36.0 g/dL   RDW 13.6 11.5 - 15.5 %   Platelets 313 150 - 400 K/uL   nRBC 0.0 0.0 - 0.2 %    Comment: Performed at [facility], [place], [place] [address]  Troponin I (High Sensitivity)     Status: None   Collection Time: 11/24/21  9:33 AM  Result Value Ref Range   Troponin I (High Sensitivity) 13 <18 ng/L    Comment: (NOTE) Elevated high sensitivity troponin I (hsTnI) values and significant  changes across serial measurements may suggest ACS but many other  chronic and acute conditions are known to elevate hsTnI results.  Refer to the "Links" section for chest pain algorithms and additional  guidance. Performed at [facility], [address] [place],  [address]   Lipid panel     Status: Abnormal   Collection Time: 11/24/21  9:33 AM  Result Value Ref Range   Cholesterol 190 0 - 200 mg/dL   Triglycerides 150 (H) <150 mg/dL   HDL 61 >40 mg/dL   Total CHOL/HDL Ratio 3.1 RATIO   VLDL 30 0 - 40 mg/dL   LDL Cholesterol 99 0 - 99 mg/dL    Comment:        Total Cholesterol/HDL:CHD Risk Coronary Heart Disease Risk Table                     Men   Women  1/2 Average Risk  3.4   3.3  Average Risk       5.0   4.4  2 X Average Risk   9.6   7.1  3 X Average Risk  23.4   11.0         Use the calculated Patient Ratio above and the CHD Risk Table to determine the patient's CHD Risk.        ATP III CLASSIFICATION (LDL):  <100     mg/dL   Optimal  100-129  mg/dL   Near or Above                    Optimal  130-159  mg/dL   Borderline  160-189  mg/dL   High  >190     mg/dL   Very High Performed at [facility], [address] [place], [place] [address]   Hemoglobin A1c     Status: Abnormal   Collection Time: 11/24/21  9:33 AM  Result Value Ref Range   Hgb A1c MFr Bld 3.8 (L) 4.8 - 5.6 %    Comment: (NOTE) Pre diabetes:          5.7%-6.4%  Diabetes:              >6.4%  Glycemic control for   <7.0% adults with diabetes    Mean Plasma Glucose 62.36 mg/dL    Comment: Performed at [facility] [address] [place], [place] [address]  Protime-INR     Status: None   Collection Time: 11/24/21  3:09 PM  Result Value Ref Range   Prothrombin Time 13.0 11.4 - 15.2 seconds   INR 1.0 0.8 - 1.2    Comment: (NOTE) INR goal varies based on device and disease states. Performed at [facility], [address] [place], [place] [address]   APTT     Status: None   Collection Time: 11/24/21  3:09 PM  Result Value Ref Range   aPTT 31 24 - 36 seconds    Comment: Performed at [facility], [address] [place], [place] [address]  D-dimer, quantitative     Status: Abnormal   Collection Time: 11/24/21  3:09 PM  Result Value Ref Range   D-Dimer, Quant 2.75 (H) 0.00 - 0.50 ug/mL-FEU    Comment: (NOTE) At the manufacturer cut-off value of 0.5 g/mL FEU, this assay has a negative predictive value of 95-100%.This assay is intended for use in conjunction with a clinical pretest probability (PTP) assessment model to exclude pulmonary embolism (PE) and deep venous thrombosis (DVT) in outpatients suspected of PE or DVT. Results should be correlated with clinical presentation. Performed at [facility], [address]  [place], [place] [address]   Urinalysis, Routine w reflex microscopic Urine, Clean Catch     Status: Abnormal   Collection Time: 11/24/21  3:18 PM  Result Value Ref Range   Color, Urine YELLOW (A) YELLOW   APPearance CLEAR (A) CLEAR   Specific Gravity, Urine 1.010 1.005 - 1.030   pH 6.0 5.0 - 8.0   Glucose, UA NEGATIVE NEGATIVE mg/dL   Hgb urine dipstick SMALL (A) NEGATIVE   Bilirubin Urine NEGATIVE NEGATIVE   Ketones, ur NEGATIVE NEGATIVE mg/dL   Protein, ur 30 (A) NEGATIVE mg/dL   Nitrite NEGATIVE NEGATIVE   Leukocytes,Ua NEGATIVE NEGATIVE   RBC / HPF 0-5 0 - 5 RBC/hpf   WBC, UA 0-5 0 - 5 WBC/hpf   Bacteria, UA NONE SEEN NONE SEEN  Squamous Epithelial / LPF 0-5 0 - 5   Mucus PRESENT    Hyaline Casts, UA PRESENT     Comment: Performed at [facility], [address] [place], [place] [address]  ECHOCARDIOGRAM COMPLETE     Status: None   Collection Time: 11/24/21  8:03 PM  Result Value Ref Range   Weight 3,040 oz   Height 66 in   BP 167/82 mmHg   S' Lateral 2.40 cm   Area-P 1/2 3.08 cm2  Culture, blood (Routine X 2) w Reflex to ID Panel     Status: None   Collection Time: 11/24/21  9:34 PM   Specimen: BLOOD  Result Value Ref Range   Specimen Description BLOOD LEFT ASSIST CONTROL    Special Requests      BOTTLES DRAWN AEROBIC AND ANAEROBIC Blood Culture adequate volume   Culture      NO GROWTH 5 DAYS Performed at [facility], [address] [place], [place] [address]    Report Status 11/29/2021 FINAL   Culture, blood (Routine X 2) w Reflex to ID Panel     Status: None   Collection Time: 11/24/21  9:40 PM   Specimen: BLOOD  Result Value Ref Range   Specimen Description BLOOD LEFT HAND    Special Requests      BOTTLES DRAWN AEROBIC AND ANAEROBIC Blood Culture adequate volume   Culture      NO GROWTH 5 DAYS Performed at [facility], [address] [place], [place] [address]    Report Status 11/29/2021 FINAL   CBC     Status: Abnormal    Collection Time: 11/25/21  4:26 AM  Result Value Ref Range   WBC 7.6 4.0 - 10.5 K/uL   RBC 2.88 (L) 4.22 - 5.81 MIL/uL   Hemoglobin 8.2 (L) 13.0 - 17.0 g/dL   HCT 26.7 (L) 39.0 - 52.0 %   MCV 92.7 80.0 - 100.0 fL   MCH 28.5 26.0 - 34.0 pg   MCHC 30.7 30.0 - 36.0 g/dL   RDW 13.4 11.5 - 15.5 %   Platelets 247 150 - 400 K/uL   nRBC 0.0 0.0 - 0.2 %    Comment: Performed at [facility], [address] [place], [place] [address]  Basic metabolic panel     Status: Abnormal   Collection Time: 11/25/21  4:26 AM  Result Value Ref Range   Sodium 145 135 - 145 mmol/L   Potassium 4.2 3.5 - 5.1 mmol/L   Chloride 114 (H) 98 - 111 mmol/L   CO2 27 22 - 32 mmol/L   Glucose, Bld 90 70 - 99 mg/dL    Comment: Glucose reference range applies only to samples taken after fasting for at least 8 hours.   BUN 16 8 - 23 mg/dL   Creatinine, Ser 1.35 (H) 0.61 - 1.24 mg/dL   Calcium 8.6 (L) 8.9 - 10.3 mg/dL   GFR, Estimated 53 (L) >60 mL/min    Comment: (NOTE) Calculated using the CKD-EPI Creatinine Equation (2021)    Anion gap 4 (L) 5 - 15    Comment: Performed at [facility], [address] [place], [place] [address]  CBC     Status: Abnormal   Collection Time: 11/26/21  4:06 AM  Result Value Ref Range   WBC 6.0 4.0 - 10.5 K/uL   RBC 3.03 (L) 4.22 - 5.81 MIL/uL   Hemoglobin 8.4 (L) 13.0 - 17.0 g/dL   HCT 27.9 (L)  39.0 - 52.0 %   MCV 92.1 80.0 - 100.0 fL   MCH 27.7 26.0 - 34.0 pg   MCHC 30.1 30.0 - 36.0 g/dL   RDW 13.6 11.5 - 15.5 %   Platelets 233 150 - 400 K/uL   nRBC 0.0 0.0 - 0.2 %    Comment: Performed at [facility], [address] [place], [place] [address]  Basic metabolic panel     Status: Abnormal   Collection Time: 11/26/21  4:06 AM  Result Value Ref Range   Sodium 142 135 - 145 mmol/L   Potassium 4.2 3.5 - 5.1 mmol/L   Chloride 111 98 - 111 mmol/L   CO2 25 22 - 32 mmol/L   Glucose, Bld 83 70 - 99 mg/dL    Comment: Glucose reference range applies  only to samples taken after fasting for at least 8 hours.   BUN 12 8 - 23 mg/dL   Creatinine, Ser 1.32 (H) 0.61 - 1.24 mg/dL   Calcium 8.3 (L) 8.9 - 10.3 mg/dL   GFR, Estimated 55 (L) >60 mL/min    Comment: (NOTE) Calculated using the CKD-EPI Creatinine Equation (2021)    Anion gap 6 5 - 15    Comment: Performed at [facility], [address] [place], [place] [address]  Basic metabolic panel     Status: Abnormal   Collection Time: 11/28/21  5:58 AM  Result Value Ref Range   Sodium 138 135 - 145 mmol/L   Potassium 4.3 3.5 - 5.1 mmol/L   Chloride 109 98 - 111 mmol/L   CO2 25 22 - 32 mmol/L   Glucose, Bld 86 70 - 99 mg/dL    Comment: Glucose reference range applies only to samples taken after fasting for at least 8 hours.   BUN 20 8 - 23 mg/dL   Creatinine, Ser 1.21 0.61 - 1.24 mg/dL   Calcium 8.5 (L) 8.9 - 10.3 mg/dL   GFR, Estimated >60 >60 mL/min    Comment: (NOTE) Calculated using the CKD-EPI Creatinine Equation (2021)    Anion gap 4 (L) 5 - 15    Comment: Performed at [facility], [address] [place], [place] [address]  CBC     Status: Abnormal   Collection Time: 11/28/21  5:58 AM  Result Value Ref Range   WBC 6.2 4.0 - 10.5 K/uL   RBC 3.10 (L) 4.22 - 5.81 MIL/uL   Hemoglobin 8.7 (L) 13.0 - 17.0 g/dL   HCT 28.0 (L) 39.0 - 52.0 %   MCV 90.3 80.0 - 100.0 fL   MCH 28.1 26.0 - 34.0 pg   MCHC 31.1 30.0 - 36.0 g/dL   RDW 13.6 11.5 - 15.5 %   Platelets 244 150 - 400 K/uL   nRBC 0.0 0.0 - 0.2 %    Comment: Performed at [facility], [address] [place], [place] [address]  Magnesium     Status: None   Collection Time: 11/28/21  5:58 AM  Result Value Ref Range   Magnesium 2.1 1.7 - 2.4 mg/dL    Comment: Performed at [facility], [address], [place], [place] [address]  Basic metabolic panel     Status: Abnormal   Collection Time: 11/29/21  4:21 AM  Result Value Ref Range   Sodium 140 135 - 145 mmol/L   Potassium 4.4 3.5  - 5.1 mmol/L   Chloride 110 98 - 111 mmol/L   CO2 27 22 - 32 mmol/L  Glucose, Bld 91 70 - 99 mg/dL    Comment: Glucose reference range applies only to samples taken after fasting for at least 8 hours.   BUN 24 (H) 8 - 23 mg/dL   Creatinine, Ser 1.32 (H) 0.61 - 1.24 mg/dL   Calcium 8.4 (L) 8.9 - 10.3 mg/dL   GFR, Estimated 55 (L) >60 mL/min    Comment: (NOTE) Calculated using the CKD-EPI Creatinine Equation (2021)    Anion gap 3 (L) 5 - 15    Comment: Performed at [facility], [address] [place], [place] [address]  CBC     Status: Abnormal   Collection Time: 11/29/21  4:21 AM  Result Value Ref Range   WBC 7.6 4.0 - 10.5 K/uL   RBC 3.33 (L) 4.22 - 5.81 MIL/uL   Hemoglobin 9.2 (L) 13.0 - 17.0 g/dL   HCT 30.4 (L) 39.0 - 52.0 %   MCV 91.3 80.0 - 100.0 fL   MCH 27.6 26.0 - 34.0 pg   MCHC 30.3 30.0 - 36.0 g/dL   RDW 13.3 11.5 - 15.5 %   Platelets 260 150 - 400 K/uL   nRBC 0.0 0.0 - 0.2 %    Comment: Performed at [facility], [address] [place], [place] [address]  Magnesium     Status: None   Collection Time: 11/29/21  4:21 AM  Result Value Ref Range   Magnesium 2.1 1.7 - 2.4 mg/dL    Comment: Performed at [facility], [address] [place], [place] [address]  CBC with Differential/Platelet     Status: Abnormal   Collection Time: 12/03/21  5:06 AM  Result Value Ref Range   WBC 5.8 4.0 - 10.5 K/uL   RBC 3.46 (L) 4.22 - 5.81 MIL/uL   Hemoglobin 9.8 (L) 13.0 - 17.0 g/dL   HCT 32.3 (L) 39.0 - 52.0 %   MCV 93.4 80.0 - 100.0 fL   MCH 28.3 26.0 - 34.0 pg   MCHC 30.3 30.0 - 36.0 g/dL   RDW 13.6 11.5 - 15.5 %   Platelets 269 150 - 400 K/uL   nRBC 0.0 0.0 - 0.2 %   Neutrophils Relative % 59 %   Neutro Abs 3.5 1.7 - 7.7 K/uL   Lymphocytes Relative 26 %   Lymphs Abs 1.5 0.7 - 4.0 K/uL   Monocytes Relative 10 %   Monocytes Absolute 0.6 0.1 - 1.0 K/uL   Eosinophils Relative 3 %   Eosinophils Absolute 0.2 0.0 - 0.5 K/uL   Basophils Relative  1 %   Basophils Absolute 0.1 0.0 - 0.1 K/uL   Immature Granulocytes 1 %   Abs Immature Granulocytes 0.05 0.00 - 0.07 K/uL    Comment: Performed at [facility], [address] [place],  [address]  Basic metabolic panel     Status: Abnormal   Collection Time: 12/03/21  5:06 AM  Result Value Ref Range   Sodium 140 135 - 145 mmol/L   Potassium 4.1 3.5 - 5.1 mmol/L   Chloride 111 98 - 111 mmol/L   CO2 23 22 - 32 mmol/L   Glucose, Bld 93 70 - 99 mg/dL    Comment: Glucose reference range applies only to samples taken after fasting for at least 8 hours.   BUN 25 (H) 8 - 23 mg/dL   Creatinine, Ser 1.34 (H) 0.61 - 1.24 mg/dL   Calcium 8.5 (L) 8.9 - 10.3 mg/dL   GFR, Estimated 54 (L) >60 mL/min  Comment: (NOTE) Calculated using the CKD-EPI Creatinine Equation (2021)    Anion gap 6 5 - 15    Comment: Performed at [facility], [place], [place] [address]  Troponin I (High Sensitivity)     Status: None   Collection Time: 12/03/21  5:06 AM  Result Value Ref Range   Troponin I (High Sensitivity) 11 <18 ng/L    Comment: (NOTE) Elevated high sensitivity troponin I (hsTnI) values and significant  changes across serial measurements may suggest ACS but many other  chronic and acute conditions are known to elevate hsTnI results.  Refer to the "Links" section for chest pain algorithms and additional  guidance. Performed at [facility], [address] [place], [place] [address]   Urinalysis, Routine w reflex microscopic     Status: Abnormal   Collection Time: 12/03/21  6:21 AM  Result Value Ref Range   Color, Urine STRAW (A) YELLOW   APPearance CLEAR (A) CLEAR   Specific Gravity, Urine 1.004 (L) 1.005 - 1.030   pH 6.0 5.0 - 8.0   Glucose, UA NEGATIVE NEGATIVE mg/dL   Hgb urine dipstick NEGATIVE NEGATIVE   Bilirubin Urine NEGATIVE NEGATIVE   Ketones, ur NEGATIVE NEGATIVE mg/dL   Protein, ur NEGATIVE NEGATIVE mg/dL   Nitrite NEGATIVE  NEGATIVE   Leukocytes,Ua NEGATIVE NEGATIVE    Comment: Performed at [facility], [place], [place] [address]  Troponin I (High Sensitivity)     Status: None   Collection Time: 12/03/21  9:28 AM  Result Value Ref Range   Troponin I (High Sensitivity) 5 <18 ng/L    Comment: (NOTE) Elevated high sensitivity troponin I (hsTnI) values and significant  changes across serial measurements may suggest ACS but many other  chronic and acute conditions are known to elevate hsTnI results.  Refer to the "Links" section for chest pain algorithms and additional  guidance. Performed at [facility], [address] [place], [place] [address]   CBC     Status: Abnormal   Collection Time: 12/03/21  4:40 PM  Result Value Ref Range   WBC 6.2 4.0 - 10.5 K/uL   RBC 3.27 (L) 4.22 - 5.81 MIL/uL   Hemoglobin 9.2 (L) 13.0 - 17.0 g/dL   HCT 30.2 (L) 39.0 - 52.0 %   MCV 92.4 80.0 - 100.0 fL   MCH 28.1 26.0 - 34.0 pg   MCHC 30.5 30.0 - 36.0 g/dL   RDW 13.6 11.5 - 15.5 %   Platelets 257 150 - 400 K/uL   nRBC 0.0 0.0 - 0.2 %    Comment: Performed at [facility], [address] [place], [place] [address]  Creatinine, serum     Status: None   Collection Time: 12/03/21  4:40 PM  Result Value Ref Range   Creatinine, Ser 1.21 0.61 - 1.24 mg/dL   GFR, Estimated >60 >60 mL/min    Comment: (NOTE) Calculated using the CKD-EPI Creatinine Equation (2021) Performed at [facility], [place], [place] [address]   Heparin level (unfractionated)     Status: None   Collection Time: 12/04/21 12:03 AM  Result Value Ref Range   Heparin Unfractionated 0.70 0.30 - 0.70 IU/mL    Comment: (NOTE) The clinical reportable range upper limit is being lowered to >1.10 to align with the FDA approved guidance for the current laboratory assay.  If heparin results are below expected values, and patient dosage has  been confirmed, suggest  follow up testing of  antithrombin III levels. Performed at [facility], [place], [place] [address]   Heparin level (unfractionated)     Status: Abnormal   Collection Time: 12/04/21  7:55 AM  Result Value Ref Range   Heparin Unfractionated 1.07 (H) 0.30 - 0.70 IU/mL    Comment: (NOTE) The clinical reportable range upper limit is being lowered to >1.10 to align with the FDA approved guidance for the current laboratory assay.  If heparin results are below expected values, and patient dosage has  been confirmed, suggest follow up testing of antithrombin III levels. Performed at [facility], [address] [place], [place] [address]   Magnesium     Status: None   Collection Time: 12/04/21  7:55 AM  Result Value Ref Range   Magnesium 2.0 1.7 - 2.4 mg/dL    Comment: Performed at [facility], [address] [place], [place] [address]  CBC with Differential/Platelet     Status: Abnormal   Collection Time: 12/04/21  7:55 AM  Result Value Ref Range   WBC 6.9 4.0 - 10.5 K/uL   RBC 3.27 (L) 4.22 - 5.81 MIL/uL   Hemoglobin 9.1 (L) 13.0 - 17.0 g/dL   HCT 29.8 (L) 39.0 - 52.0 %   MCV 91.1 80.0 - 100.0 fL   MCH 27.8 26.0 - 34.0 pg   MCHC 30.5 30.0 - 36.0 g/dL   RDW 13.6 11.5 - 15.5 %   Platelets 286 150 - 400 K/uL   nRBC 0.0 0.0 - 0.2 %   Neutrophils Relative % 63 %   Neutro Abs 4.4 1.7 - 7.7 K/uL   Lymphocytes Relative 24 %   Lymphs Abs 1.7 0.7 - 4.0 K/uL   Monocytes Relative 8 %   Monocytes Absolute 0.6 0.1 - 1.0 K/uL   Eosinophils Relative 3 %   Eosinophils Absolute 0.2 0.0 - 0.5 K/uL   Basophils Relative 1 %   Basophils Absolute 0.1 0.0 - 0.1 K/uL   Immature Granulocytes 1 %   Abs Immature Granulocytes 0.05 0.00 - 0.07 K/uL    Comment: Performed at [facility], [address] [place], [place] [address]  Comprehensive metabolic panel     Status: Abnormal   Collection Time: 12/04/21  7:55 AM  Result Value Ref Range   Sodium 139 135 - 145  mmol/L   Potassium 4.3 3.5 - 5.1 mmol/L   Chloride 113 (H) 98 - 111 mmol/L   CO2 23 22 - 32 mmol/L   Glucose, Bld 92 70 - 99 mg/dL    Comment: Glucose reference range applies only to samples taken after fasting for at least 8 hours.   BUN 18 8 - 23 mg/dL   Creatinine, Ser 1.14 0.61 - 1.24 mg/dL   Calcium 8.6 (L) 8.9 - 10.3 mg/dL   Total Protein 6.2 (L) 6.5 - 8.1 g/dL   Albumin 2.8 (L) 3.5 - 5.0 g/dL   AST 30 15 - 41 U/L   ALT 22 0 - 44 U/L   Alkaline Phosphatase 50 38 - 126 U/L   Total Bilirubin 0.5 0.3 - 1.2 mg/dL   GFR, Estimated >60 >60 mL/min    Comment: (NOTE) Calculated using the CKD-EPI Creatinine Equation (2021)    Anion gap 3 (L) 5 - 15    Comment: Performed at [facility], [address], [place], [place] [address]  Phosphorus     Status: None   Collection Time: 12/04/21  7:55 AM  Result Value Ref Range   Phosphorus 4.0 2.5 - 4.6 mg/dL    Comment: Performed at [facility], [place], [place] [address]  Heparin level (unfractionated)     Status: Abnormal   Collection Time: 12/04/21  6:19 PM  Result Value Ref Range   Heparin Unfractionated 0.85 (H) 0.30 - 0.70 IU/mL    Comment: (NOTE) The clinical reportable range upper limit is being lowered to >1.10 to align with the FDA approved guidance for the current laboratory assay.  If heparin results are below expected values, and patient dosage has  been confirmed, suggest follow up testing of antithrombin III levels. Performed at [facility], [address] [place], [place] [address]   Magnesium     Status: None   Collection Time: 12/05/21  4:46 AM  Result Value Ref Range   Magnesium 2.0 1.7 - 2.4 mg/dL    Comment: Performed at [facility], [address] [place], [place] [address]  CBC with Differential/Platelet     Status: Abnormal   Collection Time: 12/05/21  4:46 AM  Result Value Ref Range   WBC 7.8 4.0 - 10.5 K/uL   RBC 3.44 (L) 4.22 - 5.81 MIL/uL    Hemoglobin 9.5 (L) 13.0 - 17.0 g/dL   HCT 31.0 (L) 39.0 - 52.0 %   MCV 90.1 80.0 - 100.0 fL   MCH 27.6 26.0 - 34.0 pg   MCHC 30.6 30.0 - 36.0 g/dL   RDW 13.7 11.5 - 15.5 %   Platelets 288 150 - 400 K/uL   nRBC 0.0 0.0 - 0.2 %   Neutrophils Relative % 54 %   Neutro Abs 4.3 1.7 - 7.7 K/uL   Lymphocytes Relative 33 %   Lymphs Abs 2.5 0.7 - 4.0 K/uL   Monocytes Relative 8 %   Monocytes Absolute 0.7 0.1 - 1.0 K/uL   Eosinophils Relative 3 %   Eosinophils Absolute 0.2 0.0 - 0.5 K/uL   Basophils Relative 1 %   Basophils Absolute 0.1 0.0 - 0.1 K/uL   Immature Granulocytes 1 %   Abs Immature Granulocytes 0.06 0.00 - 0.07 K/uL    Comment: Performed at [facility], [address] [place], [place] [address]  Comprehensive metabolic panel     Status: Abnormal   Collection Time: 12/05/21  4:46 AM  Result Value Ref Range   Sodium 139 135 - 145 mmol/L   Potassium 4.3 3.5 - 5.1 mmol/L   Chloride 111 98 - 111 mmol/L   CO2 23 22 - 32 mmol/L   Glucose, Bld 87 70 - 99 mg/dL    Comment: Glucose reference range applies only to samples taken after fasting for at least 8 hours.   BUN 18 8 - 23 mg/dL   Creatinine, Ser 1.12 0.61 - 1.24 mg/dL   Calcium 8.7 (L) 8.9 - 10.3 mg/dL   Total Protein 6.5 6.5 - 8.1 g/dL   Albumin 2.9 (L) 3.5 - 5.0 g/dL   AST 46 (H) 15 - 41 U/L   ALT 33 0 - 44 U/L   Alkaline Phosphatase 52 38 - 126 U/L   Total Bilirubin 0.7 0.3 - 1.2 mg/dL   GFR, Estimated >60 >60 mL/min    Comment: (NOTE) Calculated using the CKD-EPI Creatinine Equation (2021)    Anion gap 5 5 - 15    Comment: Performed at [facility], [address] [place], [place] [address]  Phosphorus  Status: None   Collection Time: 12/05/21  4:46 AM  Result Value Ref Range   Phosphorus 3.5 2.5 - 4.6 mg/dL    Comment: Performed at [facility], [place], [place] [address]  Heparin level (unfractionated)     Status: None   Collection Time: 12/05/21  4:46 AM   Result Value Ref Range   Heparin Unfractionated 0.51 0.30 - 0.70 IU/mL    Comment: (NOTE) The clinical reportable range upper limit is being lowered to >1.10 to align with the FDA approved guidance for the current laboratory assay.  If heparin results are below expected values, and patient dosage has  been confirmed, suggest follow up testing of antithrombin III levels. Performed at [facility], [address] [place], [place] [address]   Magnesium     Status: None   Collection Time: 12/06/21  6:05 AM  Result Value Ref Range   Magnesium 2.2 1.7 - 2.4 mg/dL    Comment: Performed at [facility], [address] [place], [place] [address]  CBC with Differential/Platelet     Status: Abnormal   Collection Time: 12/06/21  6:05 AM  Result Value Ref Range   WBC 6.5 4.0 - 10.5 K/uL   RBC 3.42 (L) 4.22 - 5.81 MIL/uL   Hemoglobin 9.5 (L) 13.0 - 17.0 g/dL   HCT 31.1 (L) 39.0 - 52.0 %   MCV 90.9 80.0 - 100.0 fL   MCH 27.8 26.0 - 34.0 pg   MCHC 30.5 30.0 - 36.0 g/dL   RDW 13.6 11.5 - 15.5 %   Platelets 273 150 - 400 K/uL   nRBC 0.0 0.0 - 0.2 %   Neutrophils Relative % 58 %   Neutro Abs 3.9 1.7 - 7.7 K/uL   Lymphocytes Relative 27 %   Lymphs Abs 1.8 0.7 - 4.0 K/uL   Monocytes Relative 10 %   Monocytes Absolute 0.6 0.1 - 1.0 K/uL   Eosinophils Relative 3 %   Eosinophils Absolute 0.2 0.0 - 0.5 K/uL   Basophils Relative 1 %   Basophils Absolute 0.0 0.0 - 0.1 K/uL   Immature Granulocytes 1 %   Abs Immature Granulocytes 0.05 0.00 - 0.07 K/uL    Comment: Performed at [facility], [address] [place], [place] [address]  Comprehensive metabolic panel     Status: Abnormal   Collection Time: 12/06/21  6:05 AM  Result Value Ref Range   Sodium 141 135 - 145 mmol/L   Potassium 4.1 3.5 - 5.1 mmol/L   Chloride 115 (H) 98 - 111 mmol/L   CO2 23 22 - 32 mmol/L   Glucose, Bld 80 70 - 99 mg/dL    Comment: Glucose reference range applies only to samples taken  after fasting for at least 8 hours.   BUN 22 8 - 23 mg/dL   Creatinine, Ser 1.14 0.61 - 1.24 mg/dL   Calcium 8.7 (L) 8.9 - 10.3 mg/dL   Total Protein 6.3 (L) 6.5 - 8.1 g/dL   Albumin 2.8 (L) 3.5 - 5.0 g/dL   AST 36 15 - 41 U/L   ALT 29 0 - 44 U/L   Alkaline Phosphatase 50 38 - 126 U/L   Total Bilirubin 0.4 0.3 - 1.2 mg/dL   GFR, Estimated >60 >60 mL/min    Comment: (NOTE) Calculated using the CKD-EPI Creatinine Equation (2021)    Anion gap 3 (L) 5 - 15    Comment: Performed at [facility], [address] [place], [place] [address]  Phosphorus     Status: None   Collection Time: 12/06/21  6:05 AM  Result Value Ref Range   Phosphorus 4.2 2.5 - 4.6 mg/dL    Comment: Performed at [facility], [address] [place], [place] [address]  Magnesium     Status: None   Collection Time: 12/07/21  6:41 AM  Result Value Ref Range   Magnesium 2.3 1.7 - 2.4 mg/dL    Comment: Performed at [facility], [address] [place], [place] [address]  CBC with Differential/Platelet     Status: Abnormal   Collection Time: 12/07/21  6:41 AM  Result Value Ref Range   WBC 7.0 4.0 - 10.5 K/uL   RBC 3.54 (L) 4.22 - 5.81 MIL/uL   Hemoglobin 9.9 (L) 13.0 - 17.0 g/dL   HCT 32.2 (L) 39.0 - 52.0 %   MCV 91.0 80.0 - 100.0 fL   MCH 28.0 26.0 - 34.0 pg   MCHC 30.7 30.0 - 36.0 g/dL   RDW 13.6 11.5 - 15.5 %   Platelets 285 150 - 400 K/uL   nRBC 0.0 0.0 - 0.2 %   Neutrophils Relative % 58 %   Neutro Abs 4.0 1.7 - 7.7 K/uL   Lymphocytes Relative 27 %   Lymphs Abs 1.9 0.7 - 4.0 K/uL   Monocytes Relative 10 %   Monocytes Absolute 0.7 0.1 - 1.0 K/uL   Eosinophils Relative 3 %   Eosinophils Absolute 0.2 0.0 - 0.5 K/uL   Basophils Relative 1 %   Basophils Absolute 0.1 0.0 - 0.1 K/uL   Immature Granulocytes 1 %   Abs Immature Granulocytes 0.08 (H) 0.00 - 0.07 K/uL    Comment: Performed at [facility], [address] [place], [place] [address]  Comprehensive  metabolic panel     Status: Abnormal   Collection Time: 12/07/21  6:41 AM  Result Value Ref Range   Sodium 139 135 - 145 mmol/L   Potassium 4.6 3.5 - 5.1 mmol/L   Chloride 110 98 - 111 mmol/L   CO2 24 22 - 32 mmol/L   Glucose, Bld 86 70 - 99 mg/dL    Comment: Glucose reference range applies only to samples taken after fasting for at least 8 hours.   BUN 26 (H) 8 - 23 mg/dL   Creatinine, Ser 1.34 (H) 0.61 - 1.24 mg/dL   Calcium 8.9 8.9 - 10.3 mg/dL   Total Protein 6.5 6.5 - 8.1 g/dL   Albumin 3.0 (L) 3.5 - 5.0 g/dL   AST 31 15 - 41 U/L   ALT 26 0 - 44 U/L   Alkaline Phosphatase 52 38 - 126 U/L   Total Bilirubin 0.6 0.3 - 1.2 mg/dL   GFR, Estimated 54 (L) >60 mL/min    Comment: (NOTE) Calculated using the CKD-EPI Creatinine Equation (2021)    Anion gap 5 5 - 15    Comment: Performed at [facility], [address], [place], [place] [address]  Phosphorus     Status: None   Collection Time: 12/07/21  6:41 AM  Result Value Ref Range   Phosphorus 4.5 2.5 - 4.6 mg/dL    Comment: Performed at [facility], [address], [place], [place] [address]  Magnesium     Status: None   Collection Time: 12/08/21  6:38 AM  Result Value Ref Range   Magnesium 2.2 1.7 - 2.4 mg/dL    Comment: Performed at [facility],  [place], [place] [address]  CBC with Differential/Platelet     Status: Abnormal   Collection Time: 12/08/21  6:38 AM  Result Value Ref Range   WBC 7.8 4.0 - 10.5 K/uL   RBC 3.66 (L) 4.22 - 5.81 MIL/uL   Hemoglobin 10.2 (L) 13.0 - 17.0 g/dL   HCT 33.3 (L) 39.0 - 52.0 %   MCV 91.0 80.0 - 100.0 fL   MCH 27.9 26.0 - 34.0 pg   MCHC 30.6 30.0 - 36.0 g/dL   RDW 14.0 11.5 - 15.5 %   Platelets 304 150 - 400 K/uL   nRBC 0.0 0.0 - 0.2 %   Neutrophils Relative % 59 %   Neutro Abs 4.6 1.7 - 7.7 K/uL   Lymphocytes Relative 27 %   Lymphs Abs 2.1 0.7 - 4.0 K/uL   Monocytes Relative 10 %   Monocytes Absolute 0.8 0.1 - 1.0 K/uL   Eosinophils  Relative 2 %   Eosinophils Absolute 0.2 0.0 - 0.5 K/uL   Basophils Relative 1 %   Basophils Absolute 0.1 0.0 - 0.1 K/uL   Immature Granulocytes 1 %   Abs Immature Granulocytes 0.11 (H) 0.00 - 0.07 K/uL    Comment: Performed at [facility], [address] [place], [place] [address]  Comprehensive metabolic panel     Status: Abnormal   Collection Time: 12/08/21  6:38 AM  Result Value Ref Range   Sodium 139 135 - 145 mmol/L   Potassium 4.7 3.5 - 5.1 mmol/L   Chloride 110 98 - 111 mmol/L   CO2 24 22 - 32 mmol/L   Glucose, Bld 95 70 - 99 mg/dL    Comment: Glucose reference range applies only to samples taken after fasting for at least 8 hours.   BUN 27 (H) 8 - 23 mg/dL   Creatinine, Ser 1.24 0.61 - 1.24 mg/dL   Calcium 9.1 8.9 - 10.3 mg/dL   Total Protein 6.8 6.5 - 8.1 g/dL   Albumin 3.2 (L) 3.5 - 5.0 g/dL   AST 36 15 - 41 U/L   ALT 26 0 - 44 U/L   Alkaline Phosphatase 52 38 - 126 U/L   Total Bilirubin 0.5 0.3 - 1.2 mg/dL   GFR, Estimated 59 (L) >60 mL/min    Comment: (NOTE) Calculated using the CKD-EPI Creatinine Equation (2021)    Anion gap 5 5 - 15    Comment: Performed at [facility], [address], [place], [place] [address]  Phosphorus     Status: Abnormal   Collection Time: 12/08/21  6:38 AM  Result Value Ref Range   Phosphorus 4.9 (H) 2.5 - 4.6 mg/dL    Comment: Performed at [facility], [address] [place], [place] [address]    Radiology CT Angio Chest PE W and/or Wo Contrast  Result Date: 12/03/2021 CLINICAL DATA:  Chest pain and altered mental status. History of DVT EXAM: CT ANGIOGRAPHY CHEST WITH CONTRAST TECHNIQUE: Multidetector CT imaging of the chest was performed using the standard protocol during bolus administration of intravenous contrast. Multiplanar CT image reconstructions and MIPs were obtained to evaluate the vascular anatomy. RADIATION DOSE REDUCTION: This exam was performed according to the departmental  dose-optimization program which includes automated exposure control, adjustment of the mA and/or kV according to patient size and/or use of iterative reconstruction technique. CONTRAST:  69m OMNIPAQUE IOHEXOL 350 MG/ML SOLN COMPARISON:  CT chest 09/15/2018 FINDINGS: Cardiovascular: There is adequate opacification of the pulmonary arteries to  the segmental level. There is web-like nonocclusive clot at the bifurcation of the right upper lobe pulmonary artery ([id number]). No other pulmonary emboli are identified. There is no convincing evidence of right heart strain. The main pulmonary artery is not dilated. The heart size is mildly enlarged. There is no pericardial effusion. There is coronary artery disease and calcified atherosclerotic plaque of the thoracic aorta. Mediastinum/Nodes: The thyroid is unremarkable. The esophagus is grossly unremarkable. There is no mediastinal, hilar, or axillary lymphadenopathy. Lungs/Pleura: The trachea and central airways are patent. There is prominent bilateral bronchial wall thickening. There are trace bilateral pleural effusions with adjacent opacities likely reflecting atelectasis. There is no focal consolidation. There is no pulmonary edema. There is no pneumothorax There are no suspicious nodules. Upper Abdomen: Bilateral renal cysts are noted. The imaged portions of the upper abdominal viscera are otherwise unremarkable. Musculoskeletal: There is no acute osseous abnormality or suspicious osseous lesion. Review of the MIP images confirms the above findings. IMPRESSION: 1. Small burden of web-like nonocclusive clot at the right upper lobe pulmonary artery bifurcation without evidence of right heart strain. 2. Trace bilateral pleural effusions and central bronchial wall thickening which may reflect bronchitis. Aortic Atherosclerosis (ICD10-I70.0). These results were called by telephone at the time of interpretation on 12/03/2021 at 11:44 am to provider Dr. Jacelyn Grip, who verbally  acknowledged these results. Electronically Signed   By: Valetta Mole M.D.   On: 12/03/2021 11:44   CT HEAD WO CONTRAST (5MM)  Result Date: 12/03/2021 CLINICAL DATA:  Mental status changes. EXAM: CT HEAD WITHOUT CONTRAST TECHNIQUE: Contiguous axial images were obtained from the base of the skull through the vertex without intravenous contrast. RADIATION DOSE REDUCTION: This exam was performed according to the departmental dose-optimization program which includes automated exposure control, adjustment of the mA and/or kV according to patient size and/or use of iterative reconstruction technique. COMPARISON:  11/26/2021. FINDINGS: Brain: There is no evidence for acute hemorrhage, hydrocephalus, mass lesion, or abnormal extra-axial fluid collection. No definite CT evidence for acute infarction. 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 Diffuse loss of parenchymal volume is consistent with atrophy. Patchy low attenuation in the deep hemispheric and periventricular white matter is nonspecific, but likely reflects chronic microvascular ischemic demyelination. Vascular: No hyperdense vessel or unexpected calcification. Skull: No evidence for fracture. No worrisome lytic or sclerotic lesion. Sinuses/Orbits: The visualized paranasal sinuses and mastoid air cells are clear. Visualized portions of the globes and intraorbital fat are unremarkable. Other: None. IMPRESSION: 1. No acute intracranial abnormality. 2. 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 3. Atrophy with chronic small vessel ischemic disease. Electronically Signed   By: Misty Stanley M.D.   On: 12/03/2021 06:07   DG Chest 2 View  Result Date: 12/03/2021 CLINICAL DATA:  Chest pain. EXAM: CHEST - 2 VIEW COMPARISON:  11/24/2021 FINDINGS: 0514 hours. Low volume film. Cardiopericardial silhouette is at upper limits of normal for size. Basilar atelectasis evident. No  edema or focal airspace consolidation. No substantial pleural effusion. Telemetry leads overlie the chest. IMPRESSION: Low volume film with basilar atelectasis. Electronically Signed   By: Misty Stanley M.D.   On: 12/03/2021 06:04   DG Hip Unilat W or Wo Pelvis 2-3 Views Right  Result Date: 12/03/2021 CLINICAL DATA:  Right leg pain. EXAM: DG HIP (WITH OR WITHOUT PELVIS) 2-3V RIGHT COMPARISON:  None Available. FINDINGS: No evidence for acute fracture. SI joints and symphysis pubis unremarkable. Patient is status post  left total hip replacement, incompletely visualize femoral component. AP and frog-leg lateral views of the right hip show no evidence for femoral neck fracture. Degenerative spurring noted in the acetabulum and femoral head. IMPRESSION: Degenerative changes in the right hip without acute bony findings. Electronically Signed   By: Misty Stanley M.D.   On: 12/03/2021 06:03   CT HEAD WO CONTRAST (5MM)  Result Date: 11/26/2021 CLINICAL DATA:  Stroke follow-up EXAM: CT HEAD WITHOUT CONTRAST TECHNIQUE: Contiguous axial images were obtained from the base of the skull through the vertex without intravenous contrast. RADIATION DOSE REDUCTION: This exam was performed according to the departmental dose-optimization program which includes automated exposure control, adjustment of the mA and/or kV according to patient size and/or use of iterative reconstruction technique. COMPARISON:  Two days ago FINDINGS: Brain: Well established left occipital infarction, non progressed. Chronic small vessel ischemia which is extensive. 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 Cerebral volume loss in keeping with age. No hematoma, hydrocephalus, or swelling Vascular: No hyperdense vessel or unexpected calcification. Skull: Normal. Negative for fracture or focal lesion. Sinuses/Orbits: No acute finding. IMPRESSION: 1. No new or progressive finding. 2. 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 Electronically Signed   By: Jorje Guild M.D.   On: 11/26/2021 06:13   ECHOCARDIOGRAM COMPLETE  Result Date: 11/25/2021    ECHOCARDIOGRAM REPORT   Patient Name:   MAYCO WALROND Date of Exam: 11/24/2021 Medical Rec #:  [id number]         Height:       66.0 in Accession #:    [id number]        Weight:       190.0 lb Date of Birth:  1940-11-09         BSA:          1.957 m Patient Age:    71 years          BP:           167/82 mmHg Patient Gender: M                 HR:           68 bpm. Exam Location:  [facility] Procedure: 2D Echo, Cardiac Doppler and Color Doppler Indications:     I63.9 Stroke  History:         Patient has no prior history of Echocardiogram examinations.                  CAD, Stroke; Risk Factors:Hypertension.  Sonographer:     Cresenciano Lick RDCS Referring Phys:  [id number] RONDELL A SMITH Diagnosing Phys: Kathlyn Sacramento MD IMPRESSIONS  1. Left ventricular ejection fraction, by estimation, is 60 to 65%. The left ventricle has normal function. The left ventricle has no regional wall motion abnormalities. There is mild left ventricular hypertrophy. Left ventricular diastolic parameters were normal.  2. Right ventricular systolic function is normal. The right ventricular size is normal. Tricuspid regurgitation signal is inadequate for assessing PA pressure.  3. Left atrial size was mildly dilated.  4. The mitral valve is normal in structure. Mild to moderate mitral valve regurgitation. No evidence of mitral stenosis.  5. The aortic valve is normal in structure. Aortic valve regurgitation is not visualized. Aortic valve sclerosis/calcification is present, without any evidence of aortic stenosis. FINDINGS  Left Ventricle: Left ventricular ejection fraction, by estimation, is 60 to 65%. The left ventricle has normal function. The left ventricle  has no regional wall motion abnormalities. The left ventricular internal cavity size was normal in size. There is  mild left ventricular  hypertrophy. Left ventricular diastolic parameters were normal. Right Ventricle: The right ventricular size is normal. No increase in right ventricular wall thickness. Right ventricular systolic function is normal. Tricuspid regurgitation signal is inadequate for assessing PA pressure. Left Atrium: Left atrial size was mildly dilated. Right Atrium: Right atrial size was normal in size. Pericardium: There is no evidence of pericardial effusion. Mitral Valve: The mitral valve is normal in structure. Mild mitral annular calcification. Mild to moderate mitral valve regurgitation. No evidence of mitral valve stenosis. Tricuspid Valve: The tricuspid valve is normal in structure. Tricuspid valve regurgitation is not demonstrated. No evidence of tricuspid stenosis. Aortic Valve: The aortic valve is normal in structure. Aortic valve regurgitation is not visualized. Aortic valve sclerosis/calcification is present, without any evidence of aortic stenosis. Pulmonic Valve: The pulmonic valve was normal in structure. Pulmonic valve regurgitation is not visualized. No evidence of pulmonic stenosis. Aorta: The aortic root is normal in size and structure. Venous: The inferior vena cava was not well visualized. IAS/Shunts: No atrial level shunt detected by color flow Doppler.  LEFT VENTRICLE PLAX 2D LVIDd:         4.60 cm   Diastology LVIDs:         2.40 cm   LV e' medial:    7.72 cm/s LV PW:         1.00 cm   LV E/e' medial:  12.7 LV IVS:        0.80 cm   LV e' lateral:   8.49 cm/s LVOT diam:     2.20 cm   LV E/e' lateral: 11.5 LV SV:         84 LV SV Index:   43 LVOT Area:     3.80 cm  RIGHT VENTRICLE RV Basal diam:  3.60 cm RV S prime:     9.57 cm/s TAPSE (M-mode): 2.0 cm LEFT ATRIUM             Index        RIGHT ATRIUM           Index LA diam:        4.50 cm 2.30 cm/m   RA Area:     10.90 cm LA Vol (A2C):   93.2 ml 47.63 ml/m  RA Volume:   20.70 ml  10.58 ml/m LA Vol (A4C):   56.6 ml 28.93 ml/m LA Biplane Vol: 77.5 ml  39.61 ml/m  AORTIC VALVE LVOT Vmax:   88.70 cm/s LVOT Vmean:  61.400 cm/s LVOT VTI:    0.220 m  AORTA Ao Root diam: 3.40 cm Ao Asc diam:  3.30 cm MITRAL VALVE MV Area (PHT): 3.08 cm     SHUNTS MV Decel Time: 246 msec     Systemic VTI:  0.22 m MV E velocity: 97.90 cm/s   Systemic Diam: 2.20 cm MV A velocity: 109.00 cm/s MV E/A ratio:  0.90 Kathlyn Sacramento MD Electronically signed by Kathlyn Sacramento MD Signature Date/Time: 11/25/2021/5:49:55 PM    Final    PERIPHERAL VASCULAR CATHETERIZATION  Result Date: 11/25/2021 See surgical note for result.  NM Pulmonary Perfusion  Result Date: 11/25/2021 CLINICAL DATA:  81 year old male with suspected pulmonary embolism EXAM: NUCLEAR MEDICINE PERFUSION LUNG SCAN TECHNIQUE: Perfusion images were obtained in multiple projections after intravenous injection of radiopharmaceutical. Ventilation scans intentionally deferred if perfusion scan and chest x-ray adequate for  interpretation during COVID 19 epidemic. RADIOPHARMACEUTICALS:  4.42 mCi Tc-74mMAA IV COMPARISON:  Chest x-ray 11/24/2021 FINDINGS: Planar scintigraphic images of the chest were performed after standard administration of technetium MAA in standard projections. No large perfusion defects. IMPRESSION: No large perfusion defects identified, compatible with low probability of PE. Electronically Signed   By: JCorrie MckusickD.O.   On: 11/25/2021 12:18   [place] Img Lower Bilateral (DVT)  Result Date: 11/25/2021 CLINICAL DATA:  Elevated D-dimer, prostate cancer EXAM: BILATERAL LOWER EXTREMITY VENOUS DOPPLER ULTRASOUND TECHNIQUE: Gray-scale sonography with compression, as well as color and duplex ultrasound, were performed to evaluate the deep venous system(s) from the level of the common femoral vein through the popliteal and proximal calf veins. COMPARISON:  None Available. FINDINGS: VENOUS The common femoral veins bilaterally are widely patent with appropriate antegrade flow, compressibility, demonstrate  appropriate respiratory variation. Greater saphenous veins of the saphenofemoral junctions are patent bilaterally. The femoral veins and popliteal veins are widely patent with appropriate antegrade flow, compressibility, and augmentation. The right profundus femoral vein is patent with appropriate antegrade flow where visualized. The left peripheral mid femoral vein demonstrates acute appearing, near occlusive thrombus protruding into the common femoral venous confluence. There is occlusive DVT identified within the visualized posterior tibial and peroneal veins of the lower extremities bilaterally. This does not appear to extend in the popliteal veins on the presented images. OTHER None. Limitations: none IMPRESSION: 1. Acute appearing, near occlusive DVT within the left profundus femoral vein protruding into the common femoral venous confluence without occlusion of this structure. 2. Occlusive DVT within the visualized posterior tibial and peroneal veins of the lower extremities bilaterally. This does not appear to extend in the popliteal veins on the presented images. 3. These results will be called to the ordering clinician or representative by the Radiologist Assistant, and communication documented in the PACS or [employer] Electronically Signed   By: AFidela SalisburyM.D.   On: 11/25/2021 02:08   MR ANGIO HEAD WO CONTRAST  Result Date: 11/24/2021 CLINICAL DATA:  Stroke EXAM: MRA NECK WITHOUT CONTRAST MRA HEAD WITHOUT CONTRAST TECHNIQUE: Angiographic images of the Circle of Willis were acquired using MRA technique without intravenous contrast. COMPARISON:  MRI head 11/24/2021 FINDINGS: MRA NECK FINDINGS Carotid bifurcation widely patent without stenosis. Both vertebral arteries are patent with antegrade flow. No vertebral artery stenosis. MRA HEAD FINDINGS Internal carotid artery widely patent bilaterally without stenosis. Moderate stenosis left A2 segment. Mild stenosis right A2 segment. Middle  cerebral arteries patent without stenosis. No vascular malformation Both vertebral arteries are patent to the basilar. Right PICA patent. Left PICA not visualized. AICA patent bilaterally. Basilar patent. Superior cerebellar and posterior cerebral arteries patent. Fetal origin right posterior cerebral artery. Mild stenosis distal right posterior cerebral artery. Mild stenosis distal posterior cerebral artery bilaterally. IMPRESSION: Negative MRA neck Moderate left anterior cerebral artery stenosis and mild right anterior cerebral artery stenosis Mild stenosis distal PCA bilaterally. No intracranial large vessel occlusion. Electronically Signed   By: CFranchot GalloM.D.   On: 11/24/2021 17:36   MR ANGIO NECK WO CONTRAST  Result Date: 11/24/2021 CLINICAL DATA:  Stroke EXAM: MRA NECK WITHOUT CONTRAST MRA HEAD WITHOUT CONTRAST TECHNIQUE: Angiographic images of the Circle of Willis were acquired using MRA technique without intravenous contrast. COMPARISON:  MRI head 11/24/2021 FINDINGS: MRA NECK FINDINGS Carotid bifurcation widely patent without stenosis. Both vertebral arteries are patent with antegrade flow. No vertebral artery stenosis. MRA HEAD FINDINGS Internal carotid artery widely patent bilaterally  without stenosis. Moderate stenosis left A2 segment. Mild stenosis right A2 segment. Middle cerebral arteries patent without stenosis. No vascular malformation Both vertebral arteries are patent to the basilar. Right PICA patent. Left PICA not visualized. AICA patent bilaterally. Basilar patent. Superior cerebellar and posterior cerebral arteries patent. Fetal origin right posterior cerebral artery. Mild stenosis distal right posterior cerebral artery. Mild stenosis distal posterior cerebral artery bilaterally. IMPRESSION: Negative MRA neck Moderate left anterior cerebral artery stenosis and mild right anterior cerebral artery stenosis Mild stenosis distal PCA bilaterally. No intracranial large vessel occlusion.  Electronically Signed   By: Franchot Gallo M.D.   On: 11/24/2021 17:36   MR BRAIN WO CONTRAST  Result Date: 11/24/2021 CLINICAL DATA:  Stroke, follow up EXAM: MRI HEAD WITHOUT CONTRAST TECHNIQUE: Multiplanar, multiecho pulse sequences of the brain and surrounding structures were obtained without intravenous contrast. COMPARISON:  CT head from the same day.  MRI Sep 14, 2021. FINDINGS: Brain: In comparison to MRI from Sep 14, 2021, new/interval infarct in the left occipital lobe (left PCA territory) with a few small areas of DWI hyperintensity in this region potentially representing evolving subacute infarct and/or artifact related to cortical laminar necrosis/petechial hemorrhage described below. Areas of T1 hyperintensity and extensive curvilinear susceptibility effect is region is compatible with laminar necrosis and/or petechial hemorrhage. No mass occupying acute hemorrhage. There is edema in this region without significant mass effect. No midline shift. No hydrocephalus or extra-axial fluid collection. Additional moderate scattered T2/FLAIR hyperintensities in the white matter, nonspecific but compatible with chronic microvascular ischemic disease. Small remote right cerebellar lacunar infarcts. Vascular: Major arterial flow voids are maintained skull base. Skull and upper cervical spine: Normal marrow signal. Sinuses/Orbits: Largely clear sinuses.  No acute orbital findings. Other: No mastoid effusions. IMPRESSION: 1. In comparison to MRI from Sep 14, 2021, new/interval infarct in the left occipital lobe (left PCA territory), which is likely subacute in chronicity and described above. Associated cortical laminar necrosis and/or petechial hemorrhage. 2. Moderate chronic microvascular ischemic disease. Electronically Signed   By: Margaretha Sheffield M.D.   On: 11/24/2021 16:54   CT Head Wo Contrast  Result Date: 11/24/2021 CLINICAL DATA:  Stroke suspected EXAM: CT HEAD WITHOUT CONTRAST TECHNIQUE: Contiguous  axial images were obtained from the base of the skull through the vertex without intravenous contrast. RADIATION DOSE REDUCTION: This exam was performed according to the departmental dose-optimization program which includes automated exposure control, adjustment of the mA and/or kV according to patient size and/or use of iterative reconstruction technique. COMPARISON:  09/14/2021 FINDINGS: Brain: Moderate area of hypodensity in the left occipital lobe and inferior parietal lobe, which is new from the prior exam, with effacement of the sulci. Possible faint curvilinear hyperdensity in this region may represent petechial hemorrhage. No parenchymal hematoma. No mass, mass effect, or midline shift. No hydrocephalus or additional extra-axial collection. Vascular: No definite hyperdense vessel. Skull: No acute osseous abnormality. Sinuses/Orbits: No acute finding. Other: The mastoids are well aerated. IMPRESSION: Hypodensity in the left occipital and inferior parietal lobe is new from the prior exam and concerning for acute and/or subacute infarct. Possible faint curvilinear hyperdensity in this region may represent petechial hemorrhage. No intraparenchymal hematoma or significant midline shift. These results were called by telephone at the time of interpretation on 11/24/2021 at 1:52 pm to provider [facility] , who verbally acknowledged these results. Electronically Signed   By: Merilyn Baba M.D.   On: 11/24/2021 13:53   DG Chest 2 View  Result Date: 11/24/2021 CLINICAL  DATA:  Syncopal episode today. EXAM: CHEST - 2 VIEW COMPARISON:  Chest radiograph 09/14/2021; CT chest 09/14/2021. FINDINGS: The heart size and mediastinal contours are within normal limits. Trace pleural fluid at the posterior costophrenic angle. A 1.4 cm nodular opacity projects over the anterior aspect of the distal thoracic spine, seen on lateral view only. No other focal consolidation. No pneumothorax. Multilevel degenerative changes in the mid  to distal thoracic spine. IMPRESSION: A 1.4 cm nodular opacity projecting over the anterior distal thoracic spine could represent a pulmonary nodule versus overlapping osseous structures. Recommend CT chest for further evaluation. Trace pleural fluid at the posterior costophrenic angle. No other focal consolidation. Electronically Signed   By: Ileana Roup M.D.   On: 11/24/2021 13:19    Assessment/Plan  DVT (deep venous thrombosis) (HCC) His duplex today shows an age-indeterminate DVT in the right posterior tibial vein only which is a significant improvement from his previous DVT studies.  He is now tolerating anticoagulation.  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  We will look to get this scheduled in the next week or 2.  This was discussed with the patient and his wife.  Acute pulmonary embolism (HCC) Tolerating anticoagulation.  Essential hypertension blood pressure control important in reducing the progression of atherosclerotic disease. On appropriate oral medications.    Leotis Pain, MD  12/18/2021 12:11 PM    This note was created with Dragon medical transcription system.  Any errors from dictation are purely unintentional

## 2021-12-20 DIAGNOSIS — I10 Essential (primary) hypertension: Secondary | ICD-10-CM | POA: Diagnosis not present

## 2021-12-20 DIAGNOSIS — R2681 Unsteadiness on feet: Secondary | ICD-10-CM | POA: Diagnosis not present

## 2021-12-21 ENCOUNTER — Emergency Department
Admission: EM | Admit: 2021-12-21 | Discharge: 2021-12-21 | Disposition: A | Discharge status: Home / Self Care | Payer: No Typology Code available for payment source | Attending: Emergency Medicine | Admitting: Emergency Medicine

## 2021-12-21 ENCOUNTER — Other Ambulatory Visit: Payer: Self-pay

## 2021-12-21 ENCOUNTER — Emergency Department: Payer: No Typology Code available for payment source

## 2021-12-21 DIAGNOSIS — R1013 Epigastric pain: Secondary | ICD-10-CM | POA: Insufficient documentation

## 2021-12-21 DIAGNOSIS — R072 Precordial pain: Secondary | ICD-10-CM | POA: Diagnosis present

## 2021-12-21 DIAGNOSIS — I959 Hypotension, unspecified: Secondary | ICD-10-CM | POA: Diagnosis not present

## 2021-12-21 DIAGNOSIS — R1011 Right upper quadrant pain: Secondary | ICD-10-CM | POA: Diagnosis not present

## 2021-12-21 DIAGNOSIS — R531 Weakness: Secondary | ICD-10-CM | POA: Diagnosis not present

## 2021-12-21 DIAGNOSIS — G4489 Other headache syndrome: Secondary | ICD-10-CM | POA: Diagnosis not present

## 2021-12-21 DIAGNOSIS — R079 Chest pain, unspecified: Secondary | ICD-10-CM

## 2021-12-21 LAB — CBC
HCT: 35.9 % — ABNORMAL LOW (ref 39.0–52.0)
Hemoglobin: 10.8 g/dL — ABNORMAL LOW (ref 13.0–17.0)
MCH: 27.3 pg (ref 26.0–34.0)
MCHC: 30.1 g/dL (ref 30.0–36.0)
MCV: 90.7 fL (ref 80.0–100.0)
Platelets: 287 10*3/uL (ref 150–400)
RBC: 3.96 MIL/uL — ABNORMAL LOW (ref 4.22–5.81)
RDW: 13.5 % (ref 11.5–15.5)
WBC: 10.1 10*3/uL (ref 4.0–10.5)
nRBC: 0 % (ref 0.0–0.2)

## 2021-12-21 LAB — COMPREHENSIVE METABOLIC PANEL
ALT: 17 U/L (ref 0–44)
AST: 25 U/L (ref 15–41)
Albumin: 3.9 g/dL (ref 3.5–5.0)
Alkaline Phosphatase: 61 U/L (ref 38–126)
Anion gap: 6 (ref 5–15)
BUN: 25 mg/dL — ABNORMAL HIGH (ref 8–23)
CO2: 25 mmol/L (ref 22–32)
Calcium: 9.8 mg/dL (ref 8.9–10.3)
Chloride: 105 mmol/L (ref 98–111)
Creatinine, Ser: 1.46 mg/dL — ABNORMAL HIGH (ref 0.61–1.24)
GFR, Estimated: 48 mL/min — ABNORMAL LOW (ref >60)
Glucose, Bld: 112 mg/dL — ABNORMAL HIGH (ref 70–99)
Potassium: 3.9 mmol/L (ref 3.5–5.1)
Sodium: 136 mmol/L (ref 135–145)
Total Bilirubin: 0.7 mg/dL (ref 0.3–1.2)
Total Protein: 8.3 g/dL — ABNORMAL HIGH (ref 6.5–8.1)

## 2021-12-21 LAB — LIPASE, BLOOD: Lipase: 62 U/L — ABNORMAL HIGH (ref 11–51)

## 2021-12-21 LAB — TROPONIN I (HIGH SENSITIVITY)
Troponin I (High Sensitivity): 12 ng/L (ref <18)
Troponin I (High Sensitivity): 11 ng/L (ref <18)

## 2021-12-21 MED ORDER — SODIUM CHLORIDE 0.9 % IV BOLUS
500.0000 mL | Freq: Once | INTRAVENOUS | Status: AC
  Administered 2021-12-21: 500 mL via INTRAVENOUS

## 2021-12-21 NOTE — Discharge Instructions (Addendum)

## 2021-12-21 NOTE — ED Triage Notes (Signed)
Brought in via medic for mid sternal chest pressure. No c/o sob, fever, chills. Patient reports nausea while in ED. Patient took 4 aspirin PTA.

## 2021-12-21 NOTE — ED Provider Notes (Signed)
[facility] Provider Note    Event Date/Time   First MD Initiated Contact with Patient 12/21/21 [phone]     (approximate)   History   Chest Pain   HPI  Mark [name] is a 81 y.o. male whose medical history includes prior stroke but with no chronic impairment other than his wife saying that he has some confusion but no dementia diagnosis, and no prior history of coronary artery disease.  He presents by EMS for evaluation of a cute onset midsternal chest pressure.  He cannot remember exactly what happened, but his wife reports that it occurred while he was asleep.  He was not able to describe well the way he is feeling but it was concerning and abnormal enough to him that they called EMS.  He took 4 baby aspirin prior to arrival.  The time he got to the emergency department he was feeling better and he has been asymptomatic since that time.  At no point did he have any shortness of breath.  He has not had any recent fever chills, nausea, vomiting, abdominal pain, no recent dysuria.  He is not certain if maybe he had acid reflux or some other issue but he is not having any abdominal pain and he has not been having any other acute symptoms.  However his wife said that he had some diarrhea and nausea and vomiting a few days ago but that has resolved.  He has been eating and drinking normally.     Physical Exam   Triage Vital Signs: ED Triage Vitals  Enc Vitals Group     BP 12/21/21 0243 (!) 141/74     Pulse Rate 12/21/21 0243 70     Resp 12/21/21 0243 (!) 21     Temp 12/21/21 0243 98.7 F (37.1 C)     Temp Source 12/21/21 0243 Oral     SpO2 12/21/21 0242 96 %     Weight 12/21/21 0245 76.3 kg (168 lb 4.8 oz)     Height 12/21/21 0245 1.676 m ('5\' 6"'$ )     Head Circumference --      Peak Flow --      Pain Score 12/21/21 0242 2     Pain Loc --      Pain Edu? --      Excl. in [place]? --     Most recent vital signs: Vitals:   12/21/21 0600 12/21/21 0630  BP: (!)  151/76 (!) 151/75  Pulse: 64 (!) 59  Resp: 18 19  Temp:    SpO2: 98% 98%     General: Awake, no distress.  Pleasant, conversant.  Unclear on some details of recent events. CV:  Good peripheral perfusion.  Normal heart sounds. Resp:  Normal effort.  Lungs are clear to auscultation bilaterally.  No accessory muscle usage. Abd:  No distention.  No tenderness to palpation of the abdomen including the epigastrium and right upper quadrant.  Negative Murphy sign.  No rebound and no guarding. Other:  Mood and affect are appropriate under the circumstances, pleasant and conversant.   ED Results / Procedures / Treatments   Labs (all labs ordered are listed, but only abnormal results are displayed) Labs Reviewed  CBC - Abnormal; Notable for the following components:      Result Value   RBC 3.96 (*)    Hemoglobin 10.8 (*)    HCT 35.9 (*)    All other components within normal limits  COMPREHENSIVE METABOLIC PANEL -  Abnormal; Notable for the following components:   Glucose, Bld 112 (*)    BUN 25 (*)    Creatinine, Ser 1.46 (*)    Total Protein 8.3 (*)    GFR, Estimated 48 (*)    All other components within normal limits  LIPASE, BLOOD - Abnormal; Notable for the following components:   Lipase 62 (*)    All other components within normal limits  TROPONIN I (HIGH SENSITIVITY)  TROPONIN I (HIGH SENSITIVITY)     EKG  ED ECG REPORT I, Hinda Kehr, the attending physician, personally viewed and interpreted this ECG.  Date: 12/21/2021 EKG Time: 2:36 AM Rate: 69 Rhythm: sinus rhythm QRS Axis: normal Intervals: LVH, normal intervals ST/T Wave abnormalities: normal Narrative Interpretation: no evidence of acute ischemia    RADIOLOGY I viewed and interpreted the patient's 1 view chest x-ray.  I see no obvious acute abnormality.  The radiologist report indicates pulmonary hypoinflation, otherwise unremarkable.    PROCEDURES:  Critical Care performed: No  .1-3 Lead EKG  Interpretation  Performed by: Hinda Kehr, MD Authorized by: Hinda Kehr, MD     Interpretation: normal     ECG rate:  60   ECG rate assessment: normal     Rhythm: sinus rhythm     Ectopy: none     Conduction: normal      MEDICATIONS ORDERED IN ED: Medications  sodium chloride 0.9 % bolus 500 mL (0 mLs Intravenous Stopped 12/21/21 0630)     IMPRESSION / MDM / ASSESSMENT AND PLAN / ED COURSE  I reviewed the triage vital signs and the nursing notes.                              Differential diagnosis includes, but is not limited to, angina, ACS including unstable angina, pneumonia, pneumothorax, PE, acid reflux, pancreatitis, nonspecific GI illness, viral illness, panic attack.  Patient's presentation is most consistent with acute presentation with potential threat to life or bodily function.  Labs/studies ordered: EKG, 1 view chest x-ray, CMP, CBC, high-sensitivity troponin x2.  EKG generally reassuring, there are some nonspecific changes but no evidence of acute ischemia.  As document above, chest x-ray is reassuring.  Initial high-sensitivity troponin is within normal limits.  CBC is within normal limits.  Comprehensive metabolic panel shows a mild elevation of his creatinine which could indicate volume depletion in the setting of his recent GI losses from what sounds like a GI viral illness that lasted about 2 days.  I ordered normal saline 500 mL IV bolus, but he is tolerating oral intake without difficulty and he should bounce back quickly from this issue.  In spite of his chronic medical issues and his age, he is low risk for ACS based on HEAR score.  His Wells score for PE is 0.  I talked with the patient and his wife and if his repeat troponin stays within normal limits, he will likely be appropriate for discharge and outpatient follow-up, particularly since he is asymptomatic at this time.  He would much prefer to go home which I feel will be appropriate if he remains  stable.  The patient is on the cardiac monitor to evaluate for evidence of arrhythmia and/or significant heart rate changes.   Clinical Course as of 12/21/21 0850  Mon Dec 21, 2021  0649 Troponin I (High Sensitivity): 11 Repeat high-sensitivity troponin is well within normal limits.  I checked on the patient again  and he remains asymptomatic, in good spirits, and says he is ready to go.  I will provide appropriate follow-up information and referral to cardiology.  His wife is comfortable with the plan as well.  I considered hospitalization given his age and nature of his symptoms, but at this point his evaluation has been very reassuring with no indication that he needs advanced imaging or hospitalization.  I gave strict return precautions. [CF]    Clinical Course User Index [CF] Hinda Kehr, MD     FINAL CLINICAL IMPRESSION(S) / ED DIAGNOSES   Final diagnoses:  Chest pain, unspecified type     Rx / DC Orders   ED Discharge Orders          Ordered    Ambulatory referral to Cardiology       Comments: If you have not heard from the Cardiology office within the next 72 hours please call [phone].   12/21/21 8768             Note:  This document was prepared using Dragon voice recognition software and may include unintentional dictation errors.   Hinda Kehr, MD 12/21/21 [phone]

## 2021-12-22 DIAGNOSIS — N179 Acute kidney failure, unspecified: Secondary | ICD-10-CM | POA: Diagnosis not present

## 2021-12-22 DIAGNOSIS — G629 Polyneuropathy, unspecified: Secondary | ICD-10-CM | POA: Diagnosis not present

## 2021-12-22 DIAGNOSIS — I700 Atherosclerosis of aorta: Secondary | ICD-10-CM | POA: Diagnosis not present

## 2021-12-22 DIAGNOSIS — I080 Rheumatic disorders of both mitral and aortic valves: Secondary | ICD-10-CM | POA: Diagnosis not present

## 2021-12-22 DIAGNOSIS — D631 Anemia in chronic kidney disease: Secondary | ICD-10-CM | POA: Diagnosis not present

## 2021-12-22 DIAGNOSIS — N1831 Chronic kidney disease, stage 3a: Secondary | ICD-10-CM | POA: Diagnosis not present

## 2021-12-22 DIAGNOSIS — Z9181 History of falling: Secondary | ICD-10-CM | POA: Diagnosis not present

## 2021-12-22 DIAGNOSIS — I82453 Acute embolism and thrombosis of peroneal vein, bilateral: Secondary | ICD-10-CM | POA: Diagnosis not present

## 2021-12-22 DIAGNOSIS — I82443 Acute embolism and thrombosis of tibial vein, bilateral: Secondary | ICD-10-CM | POA: Diagnosis not present

## 2021-12-22 DIAGNOSIS — G319 Degenerative disease of nervous system, unspecified: Secondary | ICD-10-CM | POA: Diagnosis not present

## 2021-12-22 DIAGNOSIS — G9341 Metabolic encephalopathy: Secondary | ICD-10-CM | POA: Diagnosis not present

## 2021-12-22 DIAGNOSIS — I2699 Other pulmonary embolism without acute cor pulmonale: Secondary | ICD-10-CM | POA: Diagnosis not present

## 2021-12-22 DIAGNOSIS — Z7901 Long term (current) use of anticoagulants: Secondary | ICD-10-CM | POA: Diagnosis not present

## 2021-12-22 DIAGNOSIS — I82412 Acute embolism and thrombosis of left femoral vein: Secondary | ICD-10-CM | POA: Diagnosis not present

## 2021-12-22 DIAGNOSIS — Z8673 Personal history of transient ischemic attack (TIA), and cerebral infarction without residual deficits: Secondary | ICD-10-CM | POA: Diagnosis not present

## 2021-12-22 DIAGNOSIS — M25512 Pain in left shoulder: Secondary | ICD-10-CM | POA: Diagnosis not present

## 2021-12-22 DIAGNOSIS — I2510 Atherosclerotic heart disease of native coronary artery without angina pectoris: Secondary | ICD-10-CM | POA: Diagnosis not present

## 2021-12-22 DIAGNOSIS — I1310 Hypertensive heart and chronic kidney disease without heart failure, with stage 1 through stage 4 chronic kidney disease, or unspecified chronic kidney disease: Secondary | ICD-10-CM | POA: Diagnosis not present

## 2021-12-23 ENCOUNTER — Telehealth (INDEPENDENT_AMBULATORY_CARE_PROVIDER_SITE_OTHER): Payer: Self-pay

## 2021-12-23 NOTE — Telephone Encounter (Signed)
Spoke with the patient's spouse and it was agreed for to have the patient do his IVC filter removal with Dr. Lucky Cowboy. Patient will arrive to the [facility] at 8:30 am. Pre-procedure instructions were discussed and the spouse made aware that prior auth with [facility] is being acquired.

## 2021-12-24 ENCOUNTER — Emergency Department: Payer: No Typology Code available for payment source

## 2021-12-24 ENCOUNTER — Encounter: Payer: Self-pay | Admitting: Emergency Medicine

## 2021-12-24 ENCOUNTER — Emergency Department
Admission: EM | Admit: 2021-12-24 | Discharge: 2021-12-24 | Disposition: A | Discharge status: Home / Self Care | Payer: No Typology Code available for payment source | Attending: Emergency Medicine | Admitting: Emergency Medicine

## 2021-12-24 DIAGNOSIS — R109 Unspecified abdominal pain: Secondary | ICD-10-CM

## 2021-12-24 DIAGNOSIS — I10 Essential (primary) hypertension: Secondary | ICD-10-CM | POA: Diagnosis not present

## 2021-12-24 DIAGNOSIS — R0789 Other chest pain: Secondary | ICD-10-CM | POA: Diagnosis not present

## 2021-12-24 DIAGNOSIS — I82412 Acute embolism and thrombosis of left femoral vein: Secondary | ICD-10-CM | POA: Diagnosis not present

## 2021-12-24 DIAGNOSIS — Z9181 History of falling: Secondary | ICD-10-CM | POA: Diagnosis not present

## 2021-12-24 DIAGNOSIS — I700 Atherosclerosis of aorta: Secondary | ICD-10-CM | POA: Diagnosis not present

## 2021-12-24 DIAGNOSIS — G319 Degenerative disease of nervous system, unspecified: Secondary | ICD-10-CM | POA: Diagnosis not present

## 2021-12-24 DIAGNOSIS — I82453 Acute embolism and thrombosis of peroneal vein, bilateral: Secondary | ICD-10-CM | POA: Diagnosis not present

## 2021-12-24 DIAGNOSIS — M25512 Pain in left shoulder: Secondary | ICD-10-CM | POA: Diagnosis not present

## 2021-12-24 DIAGNOSIS — I1310 Hypertensive heart and chronic kidney disease without heart failure, with stage 1 through stage 4 chronic kidney disease, or unspecified chronic kidney disease: Secondary | ICD-10-CM | POA: Diagnosis not present

## 2021-12-24 DIAGNOSIS — G9341 Metabolic encephalopathy: Secondary | ICD-10-CM | POA: Diagnosis not present

## 2021-12-24 DIAGNOSIS — R079 Chest pain, unspecified: Secondary | ICD-10-CM

## 2021-12-24 DIAGNOSIS — I2510 Atherosclerotic heart disease of native coronary artery without angina pectoris: Secondary | ICD-10-CM | POA: Insufficient documentation

## 2021-12-24 DIAGNOSIS — D631 Anemia in chronic kidney disease: Secondary | ICD-10-CM | POA: Diagnosis not present

## 2021-12-24 DIAGNOSIS — R1010 Upper abdominal pain, unspecified: Secondary | ICD-10-CM | POA: Diagnosis not present

## 2021-12-24 DIAGNOSIS — G629 Polyneuropathy, unspecified: Secondary | ICD-10-CM | POA: Diagnosis not present

## 2021-12-24 DIAGNOSIS — I080 Rheumatic disorders of both mitral and aortic valves: Secondary | ICD-10-CM | POA: Diagnosis not present

## 2021-12-24 DIAGNOSIS — I82443 Acute embolism and thrombosis of tibial vein, bilateral: Secondary | ICD-10-CM | POA: Diagnosis not present

## 2021-12-24 DIAGNOSIS — Z8673 Personal history of transient ischemic attack (TIA), and cerebral infarction without residual deficits: Secondary | ICD-10-CM | POA: Diagnosis not present

## 2021-12-24 DIAGNOSIS — Z7901 Long term (current) use of anticoagulants: Secondary | ICD-10-CM | POA: Diagnosis not present

## 2021-12-24 DIAGNOSIS — R1084 Generalized abdominal pain: Secondary | ICD-10-CM | POA: Diagnosis not present

## 2021-12-24 DIAGNOSIS — N179 Acute kidney failure, unspecified: Secondary | ICD-10-CM | POA: Diagnosis not present

## 2021-12-24 DIAGNOSIS — I2699 Other pulmonary embolism without acute cor pulmonale: Secondary | ICD-10-CM | POA: Diagnosis not present

## 2021-12-24 DIAGNOSIS — R0902 Hypoxemia: Secondary | ICD-10-CM | POA: Diagnosis not present

## 2021-12-24 DIAGNOSIS — N1831 Chronic kidney disease, stage 3a: Secondary | ICD-10-CM | POA: Diagnosis not present

## 2021-12-24 LAB — TROPONIN I (HIGH SENSITIVITY)
Troponin I (High Sensitivity): 8 ng/L (ref <18)
Troponin I (High Sensitivity): 7 ng/L (ref <18)

## 2021-12-24 LAB — BASIC METABOLIC PANEL
Anion gap: 12 (ref 5–15)
BUN: 25 mg/dL — ABNORMAL HIGH (ref 8–23)
CO2: 23 mmol/L (ref 22–32)
Calcium: 9.1 mg/dL (ref 8.9–10.3)
Chloride: 104 mmol/L (ref 98–111)
Creatinine, Ser: 1.51 mg/dL — ABNORMAL HIGH (ref 0.61–1.24)
GFR, Estimated: 46 mL/min — ABNORMAL LOW (ref >60)
Glucose, Bld: 153 mg/dL — ABNORMAL HIGH (ref 70–99)
Potassium: 3.8 mmol/L (ref 3.5–5.1)
Sodium: 139 mmol/L (ref 135–145)

## 2021-12-24 LAB — HEPATIC FUNCTION PANEL
ALT: 14 U/L (ref 0–44)
AST: 28 U/L (ref 15–41)
Albumin: 3.4 g/dL — ABNORMAL LOW (ref 3.5–5.0)
Alkaline Phosphatase: 59 U/L (ref 38–126)
Bilirubin, Direct: 0.1 mg/dL (ref 0.0–0.2)
Indirect Bilirubin: 0.7 mg/dL (ref 0.3–0.9)
Total Bilirubin: 0.8 mg/dL (ref 0.3–1.2)
Total Protein: 7.7 g/dL (ref 6.5–8.1)

## 2021-12-24 LAB — CBC
HCT: 36.1 % — ABNORMAL LOW (ref 39.0–52.0)
Hemoglobin: 11 g/dL — ABNORMAL LOW (ref 13.0–17.0)
MCH: 27.5 pg (ref 26.0–34.0)
MCHC: 30.5 g/dL (ref 30.0–36.0)
MCV: 90.3 fL (ref 80.0–100.0)
Platelets: 275 10*3/uL (ref 150–400)
RBC: 4 MIL/uL — ABNORMAL LOW (ref 4.22–5.81)
RDW: 13.9 % (ref 11.5–15.5)
WBC: 10.2 10*3/uL (ref 4.0–10.5)
nRBC: 0 % (ref 0.0–0.2)

## 2021-12-24 LAB — LIPASE, BLOOD: Lipase: 59 U/L — ABNORMAL HIGH (ref 11–51)

## 2021-12-24 MED ORDER — ONDANSETRON HCL 4 MG/2ML IJ SOLN
4.0000 mg | Freq: Once | INTRAMUSCULAR | Status: AC
  Administered 2021-12-24: 4 mg via INTRAVENOUS
  Filled 2021-12-24: qty 2

## 2021-12-24 MED ORDER — MORPHINE SULFATE (PF) 4 MG/ML IV SOLN
4.0000 mg | Freq: Once | INTRAVENOUS | Status: AC
  Administered 2021-12-24: 4 mg via INTRAVENOUS
  Filled 2021-12-24: qty 1

## 2021-12-24 MED ORDER — HYDROMORPHONE HCL 1 MG/ML IJ SOLN
1.0000 mg | Freq: Once | INTRAMUSCULAR | Status: AC
  Administered 2021-12-24: 1 mg via INTRAVENOUS
  Filled 2021-12-24: qty 1

## 2021-12-24 MED ORDER — HYDROCODONE-ACETAMINOPHEN 5-325 MG PO TABS: 1.0000 | ORAL_TABLET | ORAL | 0 refills | Status: DC | PRN

## 2021-12-24 MED ORDER — IOHEXOL 350 MG/ML SOLN
100.0000 mL | Freq: Once | INTRAVENOUS | Status: AC | PRN
  Administered 2021-12-24: 100 mL via INTRAVENOUS

## 2021-12-24 MED ORDER — SODIUM CHLORIDE 0.9 % IV BOLUS
500.0000 mL | Freq: Once | INTRAVENOUS | Status: AC
  Administered 2021-12-24: 500 mL via INTRAVENOUS

## 2021-12-24 NOTE — ED Provider Notes (Signed)
[facility] Provider Note    Event Date/Time   First MD Initiated Contact with Patient 12/24/21 [phone]     (approximate)  History   Chief Complaint: Chest Pain  HPI  CIRILO CANNER is a 81 y.o. male with a past medical history of arthritis, CAD, hypertension, hyperlipidemia, prior CVA who presents to the emergency department for abdominal pain as well as chest pain.  According to the patient and the wife around 2:00 this morning patient developed mid abdominal pain.  Wife states approximately a week ago the patient was in the emergency department for the same pain but they could not find any cause.  Also states patient was complaining of some chest pressure this morning although denies any pain right now.  No shortness of breath.  No nausea vomiting diarrhea dysuria or hematuria.  No fever.  Physical Exam   Triage Vital Signs: ED Triage Vitals [12/24/21 0259]  Enc Vitals Group     BP (!) 163/78     Pulse Rate 80     Resp 17     Temp 98.4 F (36.9 C)     Temp Source Oral     SpO2 98 %     Weight      Height      Head Circumference      Peak Flow      Pain Score      Pain Loc      Pain Edu?      Excl. in [place]?     Most recent vital signs: Vitals:   12/24/21 0259  BP: (!) 163/78  Pulse: 80  Resp: 17  Temp: 98.4 F (36.9 C)  SpO2: 98%    General: Awake, mild distress appears uncomfortable holding his abdomen. CV:  Good peripheral perfusion.  Regular rate and rhythm  Resp:  Normal effort.  Equal breath sounds bilaterally.  Abd:  No distention.  Soft, moderate tenderness to palpation without rebound guarding or distention.  No focal area of tenderness identified appears to be more generalized.   ED Results / Procedures / Treatments   EKG  EKG viewed and interpreted by myself shows a normal sinus rhythm at 81 bpm with a narrow QRS, left axis deviation, largely normal intervals with nonspecific but no concerning ST changes.  RADIOLOGY  I  have reviewed the x-ray images.  On my interpretation of the images I do not see any large consolidation. Radiology is read the x-ray is negative for acute process.   MEDICATIONS ORDERED IN ED: Medications  iohexol (OMNIPAQUE) 350 MG/ML injection 100 mL (100 mLs Intravenous Contrast Given 12/24/21 0659)     IMPRESSION / MDM / ASSESSMENT AND PLAN / ED COURSE  I reviewed the triage vital signs and the nursing notes.  Patient's presentation is most consistent with acute presentation with potential threat to life or bodily function.  Patient presents emergency department for abdominal pain as well as chest pain.  Patient described parva generalized abdominal pain starting around 2:00 this morning as well as some chest pressure at 2:00 this morning.  Denies any chest pain currently.  But does state more upper abdominal pain states earlier it was more lower.  No other symptoms such as nausea vomiting diarrhea dysuria cough congestion.  Record review shows the patient was in the emergency department 3 days ago 12/21/2021 for similar symptoms, more so on the chest at that time now more so in the abdomen today.  No findings to the  patient was discharged home with cardiology follow-up.  Wife states they have not yet seen the cardiologist.  Record review also shows the patient was just discharged from the hospital 12/08/2021 after an admission CVA complicated with DVT, CVA complicated by hemorrhagic transformation and IVC filter was placed on prior admission, found to have an acute PE and placed on Eliquis.  Given the patient's recent PE we will obtain CTA of the chest.  Given the patient's abdominal pain we will obtain CT imaging of the abdomen.  Chest pain could possibly be due to pulmonary infarction, new PE, old PE, ACS, pneumonia, pneumothorax.  X-ray does not appear to show any concerning findings but CTA is pending.  CT abdomen is pending as well as to evaluate for any abdominal causes of pain such as kidney  stone, infectious etiology such as appendicitis pyelonephritis cholecystitis diverticulitis, etc.  Patient agreeable to plan of care.  We will treat pain and nausea as well as IV hydrate while awaiting further results.  Patient's CT results are normal.  No obvious PE and no obvious cause for pain in the chest or abdomen.  After pain medication patient now states the pain is gone.  Is not entirely clear cause of the patient's pain.  We will refer to GI medicine I also asked that the patient follow-up with his doctor this week if possible.  We will prescribe a short course of pain medication for the patient.  Provided my normal chest and abdominal pain return precautions.  FINAL CLINICAL IMPRESSION(S) / ED DIAGNOSES   Chest pain Abdominal pain  Note:  This document was prepared using Dragon voice recognition software and may include unintentional dictation errors.   Harvest Dark, MD 12/24/21 1029

## 2021-12-24 NOTE — Discharge Instructions (Signed)
Please call the number to follow-up with GI medicine.  Please follow-up with your doctor within the next 1 to 2 days for recheck/reevaluation.  Please use your pain medication if needed for severe pain.  Do not drink alcohol or drive or take any other pain medication while using this medicine.  Return to the emergency department for any return of significant pain or any other symptom personally concerning to yourself.

## 2021-12-24 NOTE — ED Notes (Signed)
Pt alert, IV removed, pt given discharge instructions, pt assisted to vehicle by RN. 

## 2021-12-24 NOTE — ED Triage Notes (Addendum)
Pt arrived via [facility] from home with sudden onset on substernal chest pain x1.5 hours. Pt was given '324mg'$  Aspirin by spouse prior to EMS arrival. Pt arrived to ED and denies chest pain but sts he doesn't hurt but has discomfort all over abdomen and is nauseous. Pt unknown when last BM. Pt continues to say, "Youll have to ask my wife. I dont know. Im sorry." No listed hx/o dementia but report from previous visit of memory impairment and previous CVA.

## 2021-12-25 ENCOUNTER — Telehealth: Payer: Self-pay | Admitting: *Deleted

## 2021-12-25 DIAGNOSIS — G629 Polyneuropathy, unspecified: Secondary | ICD-10-CM | POA: Diagnosis not present

## 2021-12-25 DIAGNOSIS — Z8673 Personal history of transient ischemic attack (TIA), and cerebral infarction without residual deficits: Secondary | ICD-10-CM | POA: Diagnosis not present

## 2021-12-25 DIAGNOSIS — Z9181 History of falling: Secondary | ICD-10-CM | POA: Diagnosis not present

## 2021-12-25 DIAGNOSIS — I1310 Hypertensive heart and chronic kidney disease without heart failure, with stage 1 through stage 4 chronic kidney disease, or unspecified chronic kidney disease: Secondary | ICD-10-CM | POA: Diagnosis not present

## 2021-12-25 DIAGNOSIS — I82412 Acute embolism and thrombosis of left femoral vein: Secondary | ICD-10-CM | POA: Diagnosis not present

## 2021-12-25 DIAGNOSIS — I82453 Acute embolism and thrombosis of peroneal vein, bilateral: Secondary | ICD-10-CM | POA: Diagnosis not present

## 2021-12-25 DIAGNOSIS — D631 Anemia in chronic kidney disease: Secondary | ICD-10-CM | POA: Diagnosis not present

## 2021-12-25 DIAGNOSIS — I700 Atherosclerosis of aorta: Secondary | ICD-10-CM | POA: Diagnosis not present

## 2021-12-25 DIAGNOSIS — I82443 Acute embolism and thrombosis of tibial vein, bilateral: Secondary | ICD-10-CM | POA: Diagnosis not present

## 2021-12-25 DIAGNOSIS — Z7901 Long term (current) use of anticoagulants: Secondary | ICD-10-CM | POA: Diagnosis not present

## 2021-12-25 DIAGNOSIS — N179 Acute kidney failure, unspecified: Secondary | ICD-10-CM | POA: Diagnosis not present

## 2021-12-25 DIAGNOSIS — I080 Rheumatic disorders of both mitral and aortic valves: Secondary | ICD-10-CM | POA: Diagnosis not present

## 2021-12-25 DIAGNOSIS — I2699 Other pulmonary embolism without acute cor pulmonale: Secondary | ICD-10-CM | POA: Diagnosis not present

## 2021-12-25 DIAGNOSIS — G319 Degenerative disease of nervous system, unspecified: Secondary | ICD-10-CM | POA: Diagnosis not present

## 2021-12-25 DIAGNOSIS — M25512 Pain in left shoulder: Secondary | ICD-10-CM | POA: Diagnosis not present

## 2021-12-25 DIAGNOSIS — I2510 Atherosclerotic heart disease of native coronary artery without angina pectoris: Secondary | ICD-10-CM | POA: Diagnosis not present

## 2021-12-25 DIAGNOSIS — N1831 Chronic kidney disease, stage 3a: Secondary | ICD-10-CM | POA: Diagnosis not present

## 2021-12-25 DIAGNOSIS — G9341 Metabolic encephalopathy: Secondary | ICD-10-CM | POA: Diagnosis not present

## 2021-12-25 NOTE — Telephone Encounter (Signed)
     Patient  visit on 12/21/2021  at [facility]  was for chest pain   Have you been able to follow up with your primary care physician? Patient having anxiety and wakes up with chest pain . Does have [employer] [facility] and is going to pursue the Aide and attendence program. Will see PCP on Monday no cardio appt available until October  The patient was able to obtain any needed medicine or equipment.  Are there diet recommendations that you are having difficulty following?  Patient expresses understanding of discharge instructions and education provided has no other needs at this time.    [place] [phone] [address] , [place] [address] Email : [email or web address]

## 2021-12-26 ENCOUNTER — Other Ambulatory Visit: Payer: Self-pay

## 2021-12-26 ENCOUNTER — Inpatient Hospital Stay
Admission: EM | Admit: 2021-12-26 | Discharge: 2021-12-28 | DRG: 871 | Disposition: A | Discharge status: Hospice | Payer: No Typology Code available for payment source | Attending: Internal Medicine | Admitting: Internal Medicine

## 2021-12-26 ENCOUNTER — Emergency Department: Payer: No Typology Code available for payment source

## 2021-12-26 DIAGNOSIS — Z20822 Contact with and (suspected) exposure to covid-19: Secondary | ICD-10-CM | POA: Diagnosis present

## 2021-12-26 DIAGNOSIS — Z515 Encounter for palliative care: Secondary | ICD-10-CM | POA: Diagnosis not present

## 2021-12-26 DIAGNOSIS — J9811 Atelectasis: Secondary | ICD-10-CM | POA: Diagnosis present

## 2021-12-26 DIAGNOSIS — Z8673 Personal history of transient ischemic attack (TIA), and cerebral infarction without residual deficits: Secondary | ICD-10-CM

## 2021-12-26 DIAGNOSIS — Z8546 Personal history of malignant neoplasm of prostate: Secondary | ICD-10-CM

## 2021-12-26 DIAGNOSIS — I639 Cerebral infarction, unspecified: Secondary | ICD-10-CM | POA: Diagnosis not present

## 2021-12-26 DIAGNOSIS — R109 Unspecified abdominal pain: Secondary | ICD-10-CM | POA: Diagnosis present

## 2021-12-26 DIAGNOSIS — I4891 Unspecified atrial fibrillation: Secondary | ICD-10-CM | POA: Diagnosis present

## 2021-12-26 DIAGNOSIS — Z7401 Bed confinement status: Secondary | ICD-10-CM | POA: Diagnosis not present

## 2021-12-26 DIAGNOSIS — A419 Sepsis, unspecified organism: Principal | ICD-10-CM | POA: Diagnosis present

## 2021-12-26 DIAGNOSIS — E860 Dehydration: Secondary | ICD-10-CM | POA: Diagnosis present

## 2021-12-26 DIAGNOSIS — I959 Hypotension, unspecified: Secondary | ICD-10-CM | POA: Diagnosis not present

## 2021-12-26 DIAGNOSIS — D649 Anemia, unspecified: Secondary | ICD-10-CM | POA: Diagnosis present

## 2021-12-26 DIAGNOSIS — E669 Obesity, unspecified: Secondary | ICD-10-CM | POA: Diagnosis present

## 2021-12-26 DIAGNOSIS — E7800 Pure hypercholesterolemia, unspecified: Secondary | ICD-10-CM | POA: Diagnosis present

## 2021-12-26 DIAGNOSIS — N281 Cyst of kidney, acquired: Secondary | ICD-10-CM | POA: Diagnosis present

## 2021-12-26 DIAGNOSIS — Z6830 Body mass index (BMI) 30.0-30.9, adult: Secondary | ICD-10-CM

## 2021-12-26 DIAGNOSIS — J9621 Acute and chronic respiratory failure with hypoxia: Secondary | ICD-10-CM | POA: Diagnosis present

## 2021-12-26 DIAGNOSIS — I129 Hypertensive chronic kidney disease with stage 1 through stage 4 chronic kidney disease, or unspecified chronic kidney disease: Secondary | ICD-10-CM | POA: Diagnosis present

## 2021-12-26 DIAGNOSIS — Z66 Do not resuscitate: Secondary | ICD-10-CM | POA: Diagnosis present

## 2021-12-26 DIAGNOSIS — R1011 Right upper quadrant pain: Secondary | ICD-10-CM | POA: Diagnosis not present

## 2021-12-26 DIAGNOSIS — J9601 Acute respiratory failure with hypoxia: Secondary | ICD-10-CM | POA: Diagnosis present

## 2021-12-26 DIAGNOSIS — I5A Non-ischemic myocardial injury (non-traumatic): Secondary | ICD-10-CM | POA: Diagnosis not present

## 2021-12-26 DIAGNOSIS — Z86718 Personal history of other venous thrombosis and embolism: Secondary | ICD-10-CM

## 2021-12-26 DIAGNOSIS — I3139 Other pericardial effusion (noninflammatory): Secondary | ICD-10-CM | POA: Diagnosis present

## 2021-12-26 DIAGNOSIS — I739 Peripheral vascular disease, unspecified: Secondary | ICD-10-CM | POA: Diagnosis present

## 2021-12-26 DIAGNOSIS — I10 Essential (primary) hypertension: Secondary | ICD-10-CM | POA: Diagnosis not present

## 2021-12-26 DIAGNOSIS — Z95828 Presence of other vascular implants and grafts: Secondary | ICD-10-CM

## 2021-12-26 DIAGNOSIS — Z8249 Family history of ischemic heart disease and other diseases of the circulatory system: Secondary | ICD-10-CM | POA: Diagnosis not present

## 2021-12-26 DIAGNOSIS — Z79899 Other long term (current) drug therapy: Secondary | ICD-10-CM

## 2021-12-26 DIAGNOSIS — R1084 Generalized abdominal pain: Secondary | ICD-10-CM

## 2021-12-26 DIAGNOSIS — I248 Other forms of acute ischemic heart disease: Secondary | ICD-10-CM | POA: Diagnosis present

## 2021-12-26 DIAGNOSIS — J189 Pneumonia, unspecified organism: Secondary | ICD-10-CM | POA: Diagnosis present

## 2021-12-26 DIAGNOSIS — I82433 Acute embolism and thrombosis of popliteal vein, bilateral: Secondary | ICD-10-CM | POA: Diagnosis not present

## 2021-12-26 DIAGNOSIS — C61 Malignant neoplasm of prostate: Secondary | ICD-10-CM | POA: Diagnosis present

## 2021-12-26 DIAGNOSIS — K810 Acute cholecystitis: Secondary | ICD-10-CM | POA: Diagnosis present

## 2021-12-26 DIAGNOSIS — Z888 Allergy status to other drugs, medicaments and biological substances status: Secondary | ICD-10-CM

## 2021-12-26 DIAGNOSIS — Z86711 Personal history of pulmonary embolism: Secondary | ICD-10-CM | POA: Diagnosis not present

## 2021-12-26 DIAGNOSIS — I252 Old myocardial infarction: Secondary | ICD-10-CM

## 2021-12-26 DIAGNOSIS — R6520 Severe sepsis without septic shock: Secondary | ICD-10-CM | POA: Diagnosis present

## 2021-12-26 DIAGNOSIS — Z7901 Long term (current) use of anticoagulants: Secondary | ICD-10-CM

## 2021-12-26 DIAGNOSIS — Z7982 Long term (current) use of aspirin: Secondary | ICD-10-CM

## 2021-12-26 DIAGNOSIS — I2510 Atherosclerotic heart disease of native coronary artery without angina pectoris: Secondary | ICD-10-CM | POA: Diagnosis present

## 2021-12-26 DIAGNOSIS — N179 Acute kidney failure, unspecified: Secondary | ICD-10-CM | POA: Diagnosis present

## 2021-12-26 DIAGNOSIS — J188 Other pneumonia, unspecified organism: Secondary | ICD-10-CM | POA: Diagnosis present

## 2021-12-26 DIAGNOSIS — R0602 Shortness of breath: Secondary | ICD-10-CM | POA: Diagnosis not present

## 2021-12-26 DIAGNOSIS — N1831 Chronic kidney disease, stage 3a: Secondary | ICD-10-CM | POA: Diagnosis present

## 2021-12-26 DIAGNOSIS — Z923 Personal history of irradiation: Secondary | ICD-10-CM | POA: Diagnosis not present

## 2021-12-26 DIAGNOSIS — R0689 Other abnormalities of breathing: Secondary | ICD-10-CM | POA: Diagnosis not present

## 2021-12-26 DIAGNOSIS — R069 Unspecified abnormalities of breathing: Secondary | ICD-10-CM | POA: Diagnosis not present

## 2021-12-26 DIAGNOSIS — I82409 Acute embolism and thrombosis of unspecified deep veins of unspecified lower extremity: Secondary | ICD-10-CM | POA: Diagnosis present

## 2021-12-26 LAB — HEPATIC FUNCTION PANEL
ALT: 21 U/L (ref 0–44)
AST: 44 U/L — ABNORMAL HIGH (ref 15–41)
Albumin: 2.9 g/dL — ABNORMAL LOW (ref 3.5–5.0)
Alkaline Phosphatase: 59 U/L (ref 38–126)
Bilirubin, Direct: 0.2 mg/dL (ref 0.0–0.2)
Indirect Bilirubin: 0.8 mg/dL (ref 0.3–0.9)
Total Bilirubin: 1 mg/dL (ref 0.3–1.2)
Total Protein: 7.3 g/dL (ref 6.5–8.1)

## 2021-12-26 LAB — PROCALCITONIN: Procalcitonin: 24.83 ng/mL

## 2021-12-26 LAB — BASIC METABOLIC PANEL
Anion gap: 14 (ref 5–15)
BUN: 62 mg/dL — ABNORMAL HIGH (ref 8–23)
CO2: 20 mmol/L — ABNORMAL LOW (ref 22–32)
Calcium: 8.9 mg/dL (ref 8.9–10.3)
Chloride: 103 mmol/L (ref 98–111)
Creatinine, Ser: 2.66 mg/dL — ABNORMAL HIGH (ref 0.61–1.24)
GFR, Estimated: 24 mL/min — ABNORMAL LOW (ref >60)
Glucose, Bld: 142 mg/dL — ABNORMAL HIGH (ref 70–99)
Potassium: 5 mmol/L (ref 3.5–5.1)
Sodium: 137 mmol/L (ref 135–145)

## 2021-12-26 LAB — GLUCOSE, CAPILLARY: Glucose-Capillary: 151 mg/dL — ABNORMAL HIGH (ref 70–99)

## 2021-12-26 LAB — EXPECTORATED SPUTUM ASSESSMENT W GRAM STAIN, RFLX TO RESP C

## 2021-12-26 LAB — CBC
HCT: 30.1 % — ABNORMAL LOW (ref 39.0–52.0)
Hemoglobin: 9.1 g/dL — ABNORMAL LOW (ref 13.0–17.0)
MCH: 27.2 pg (ref 26.0–34.0)
MCHC: 30.2 g/dL (ref 30.0–36.0)
MCV: 89.9 fL (ref 80.0–100.0)
Platelets: 313 10*3/uL (ref 150–400)
RBC: 3.35 MIL/uL — ABNORMAL LOW (ref 4.22–5.81)
RDW: 14.6 % (ref 11.5–15.5)
WBC: 31.2 10*3/uL — ABNORMAL HIGH (ref 4.0–10.5)
nRBC: 0 % (ref 0.0–0.2)

## 2021-12-26 LAB — TROPONIN I (HIGH SENSITIVITY)
Troponin I (High Sensitivity): 28 ng/L — ABNORMAL HIGH (ref <18)
Troponin I (High Sensitivity): 24 ng/L — ABNORMAL HIGH (ref <18)
Troponin I (High Sensitivity): 41 ng/L — ABNORMAL HIGH (ref <18)

## 2021-12-26 LAB — LACTIC ACID, PLASMA
Lactic Acid, Venous: 2.7 mmol/L (ref 0.5–1.9)
Lactic Acid, Venous: 1.7 mmol/L (ref 0.5–1.9)

## 2021-12-26 LAB — PROTIME-INR
INR: 4.2 (ref 0.8–1.2)
Prothrombin Time: 39.9 seconds — ABNORMAL HIGH (ref 11.4–15.2)

## 2021-12-26 LAB — BRAIN NATRIURETIC PEPTIDE: B Natriuretic Peptide: 45 pg/mL (ref 0.0–100.0)

## 2021-12-26 LAB — SARS CORONAVIRUS 2 BY RT PCR: SARS Coronavirus 2 by RT PCR: NEGATIVE

## 2021-12-26 LAB — HEMOGLOBIN A1C
Hgb A1c MFr Bld: 4.7 % — ABNORMAL LOW (ref 4.8–5.6)
Mean Plasma Glucose: 88.19 mg/dL

## 2021-12-26 LAB — APTT: aPTT: 53 seconds — ABNORMAL HIGH (ref 24–36)

## 2021-12-26 LAB — SURGICAL PCR SCREEN
MRSA, PCR: NEGATIVE
Staphylococcus aureus: NEGATIVE

## 2021-12-26 LAB — HEPARIN LEVEL (UNFRACTIONATED): Heparin Unfractionated: >1.1 IU/mL — ABNORMAL HIGH (ref 0.30–0.70)

## 2021-12-26 MED ORDER — GABAPENTIN 100 MG PO CAPS
100.0000 mg | ORAL_CAPSULE | Freq: Every day | ORAL | Status: DC
  Administered 2021-12-26: 100 mg via ORAL
  Filled 2021-12-26: qty 1

## 2021-12-26 MED ORDER — ACETAMINOPHEN 325 MG PO TABS
650.0000 mg | ORAL_TABLET | Freq: Four times a day (QID) | ORAL | Status: DC | PRN
  Administered 2021-12-26: 650 mg via ORAL
  Filled 2021-12-26: qty 2

## 2021-12-26 MED ORDER — PIPERACILLIN-TAZOBACTAM IN DEX 2-0.25 GM/50ML IV SOLN
2.2500 g | Freq: Three times a day (TID) | INTRAVENOUS | Status: DC
  Administered 2021-12-26 – 2021-12-27 (×3): 2.25 g via INTRAVENOUS
  Filled 2021-12-26 (×4): qty 50

## 2021-12-26 MED ORDER — ALBUTEROL SULFATE (2.5 MG/3ML) 0.083% IN NEBU
3.0000 mL | INHALATION_SOLUTION | RESPIRATORY_TRACT | Status: DC | PRN
  Administered 2021-12-27 (×2): 3 mL via RESPIRATORY_TRACT
  Filled 2021-12-26 (×2): qty 3

## 2021-12-26 MED ORDER — DM-GUAIFENESIN ER 30-600 MG PO TB12: 1.0000 | ORAL_TABLET | Freq: Two times a day (BID) | ORAL | Status: DC | PRN

## 2021-12-26 MED ORDER — HYDRALAZINE HCL 20 MG/ML IJ SOLN: 5.0000 mg | INTRAMUSCULAR | Status: DC | PRN

## 2021-12-26 MED ORDER — SODIUM CHLORIDE 0.9 % IV BOLUS (SEPSIS)
1000.0000 mL | Freq: Once | INTRAVENOUS | Status: AC
  Administered 2021-12-26: 1000 mL via INTRAVENOUS

## 2021-12-26 MED ORDER — MORPHINE SULFATE (PF) 2 MG/ML IV SOLN
1.0000 mg | INTRAVENOUS | Status: DC | PRN
  Filled 2021-12-26: qty 1

## 2021-12-26 MED ORDER — SODIUM CHLORIDE 0.9 % IV SOLN
2.0000 g | INTRAVENOUS | Status: DC
  Administered 2021-12-26: 2 g via INTRAVENOUS
  Filled 2021-12-26: qty 20

## 2021-12-26 MED ORDER — HEPARIN BOLUS VIA INFUSION
4500.0000 [IU] | Freq: Once | INTRAVENOUS | Status: AC
Start: 2021-12-26 — End: 2021-12-26
  Administered 2021-12-26: 4500 [IU] via INTRAVENOUS
  Filled 2021-12-26: qty 4500

## 2021-12-26 MED ORDER — ROSUVASTATIN CALCIUM 10 MG PO TABS: 40.0000 mg | ORAL_TABLET | Freq: Every day | ORAL | Status: DC

## 2021-12-26 MED ORDER — ROSUVASTATIN CALCIUM 10 MG PO TABS: 10.0000 mg | ORAL_TABLET | Freq: Every evening | ORAL | Status: DC

## 2021-12-26 MED ORDER — ONDANSETRON HCL 4 MG/2ML IJ SOLN: 4.0000 mg | Freq: Three times a day (TID) | INTRAMUSCULAR | Status: DC | PRN

## 2021-12-26 MED ORDER — VANCOMYCIN VARIABLE DOSE PER UNSTABLE RENAL FUNCTION (PHARMACIST DOSING)
Status: DC
Start: 2021-12-26 — End: 2021-12-27

## 2021-12-26 MED ORDER — SODIUM CHLORIDE 0.9 % IV BOLUS (SEPSIS)
500.0000 mL | Freq: Once | INTRAVENOUS | Status: AC
  Administered 2021-12-26: 500 mL via INTRAVENOUS

## 2021-12-26 MED ORDER — VANCOMYCIN HCL 1750 MG/350ML IV SOLN
1750.0000 mg | Freq: Once | INTRAVENOUS | Status: AC
Start: 2021-12-26 — End: 2021-12-26
  Administered 2021-12-26: 1750 mg via INTRAVENOUS
  Filled 2021-12-26: qty 350

## 2021-12-26 MED ORDER — SODIUM CHLORIDE 0.9 % IV SOLN
500.0000 mg | INTRAVENOUS | Status: DC
  Administered 2021-12-26: 500 mg via INTRAVENOUS
  Filled 2021-12-26: qty 5

## 2021-12-26 MED ORDER — LACTATED RINGERS IV SOLN: INTRAVENOUS | Status: AC

## 2021-12-26 MED ORDER — TERAZOSIN HCL 5 MG PO CAPS
5.0000 mg | ORAL_CAPSULE | Freq: Every day | ORAL | Status: DC
  Administered 2021-12-26: 5 mg via ORAL
  Filled 2021-12-26: qty 1

## 2021-12-26 MED ORDER — SODIUM CHLORIDE 0.9 % IV BOLUS (SEPSIS)
1000.0000 mL | Freq: Once | INTRAVENOUS | Status: AC
Start: 2021-12-26 — End: 2021-12-26
  Administered 2021-12-26: 1000 mL via INTRAVENOUS

## 2021-12-26 MED ORDER — ASPIRIN 81 MG PO TBEC: 81.0000 mg | DELAYED_RELEASE_TABLET | Freq: Every day | ORAL | Status: DC

## 2021-12-26 MED ORDER — HEPARIN (PORCINE) 25000 UT/250ML-% IV SOLN
1300.0000 [IU]/h | INTRAVENOUS | Status: DC
  Administered 2021-12-26: 1300 [IU]/h via INTRAVENOUS
  Filled 2021-12-26: qty 250

## 2021-12-26 NOTE — Assessment & Plan Note (Signed)
See above

## 2021-12-26 NOTE — Assessment & Plan Note (Signed)
Baseline creatinine 1.2- 1.4.  His creatinine is at 2.66, BUN 62.  Likely due to dehydration and continuation of lisinopril -Hold lisinopril -IV fluid as above -Follow-up by BMP

## 2021-12-26 NOTE — Assessment & Plan Note (Signed)
Severe sepsis due to multifocal pneumonia: Patient meets criteria for severe sepsis with WBC 31.2, heart rate 94, RR 25.  Lactic acid 2.7 --> 1.7. - Will admit to med-surg bed as inpt - IV Vancomycin and Zosyn  (Zosyn will also cover possible cholecystitis) - Mucinex for cough  - Bronchodilators - Urine legionella and S. pneumococcal antigen  - Follow up blood culture x2, sputum culture and Covid19 PCR - will get Procalcitonin - IVF: 2.5L of NS bolus in ED, followed by 75 mL per hour of LR

## 2021-12-26 NOTE — Progress Notes (Signed)
[facility] following for sepsis protocol. 

## 2021-12-26 NOTE — Assessment & Plan Note (Signed)
-  see above 

## 2021-12-26 NOTE — Consult Note (Signed)
Date of Consultation:  12/26/2021  Requesting Physician:  Ivor Costa, MD  Reason for Consultation:  RUQ abdominal pain  History of Present Illness: Mark [name] is a 81 y.o. male presenting today with worsening overall clinical condition.  The patient has had a couple of admissions over the past month for CVA, DVT, and presented to the ED again on 9/7 with chest pain and abdominal pain.  CT scan of chest/abdomen/pelvis did not reveal PE or abdominal findings, but he did have possible bilateral atelectasis and pleural effusions.  He presented again today with worsening shortness of breath and again with complaints of RUQ pain.  He had another chest CT which showed worsening bilateral pneumonia, and also concerns for possible gallbladder inflammatory changes.  U/S of RUQ was done which showed possible gallbladder wall thickening and pericholecystic fluid.  His WBC is elevated to 31.2, and came in with elevated lactic acid of 2.7 and AKI with Cr 2.66.  LFTs normal except for mildly elevated AST to 44.  Past Medical History: Past Medical History:  Diagnosis Date   Arthritis    Lower Back, Hips, and Hands.   Cancer [facility])    Prostate CA s/o Rad Tx   Chronic kidney disease    Coronary artery disease    Hypercholesterolemia    Hypertension    Radiation 09/30/2008-12/02/2010   7800 cGy  Dr.Ottelin   Stroke [facility])      Past Surgical History: Past Surgical History:  Procedure Laterality Date   CATARACT SURGERY     IVC FILTER INSERTION N/A 11/25/2021   Procedure: IVC FILTER INSERTION;  Surgeon: Algernon Huxley, MD;  Location: [facility] [facility];  Service: Cardiovascular;  Laterality: N/A;   KNEE ARTHROSCOPY Left    QUADRICEPS TENDON REPAIR Right 12/15/2020   Procedure: REPAIR QUADRICEP TENDON;  Surgeon: Lovell Sheehan, MD;  Location: [facility] ORS;  Service: Orthopedics;  Laterality: Right;    Home Medications: Prior to Admission medications   Medication Sig Start Date End Date Taking? Authorizing  Provider  acetaminophen (TYLENOL) 325 MG tablet Take 2 tablets (650 mg total) by mouth every 4 (four) hours as needed for mild pain (or temp > 37.5 C (99.5 F)). 12/02/21  Yes Wieting, Richard, MD  amLODipine (NORVASC) 10 MG tablet Take 1 tablet (10 mg total) by mouth daily. 12/08/21  Yes Fritzi Mandes, MD  apixaban (ELIQUIS) 5 MG TABS tablet Two tabs po wice a day for one week then one tab po twice a day afterwards 12/08/21  Yes Fritzi Mandes, MD  aspirin EC 81 MG tablet Take 1 tablet (81 mg total) by mouth daily. Swallow whole. 12/03/21  Yes Wieting, Richard, MD  gabapentin (NEURONTIN) 100 MG capsule Take 1 capsule (100 mg total) by mouth at bedtime. 11/22/21 01/21/22 Yes Menshew, Dannielle Karvonen, PA-C  lisinopril (ZESTRIL) 40 MG tablet Take 1 tablet (40 mg total) by mouth daily. 12/08/21  Yes Fritzi Mandes, MD  rosuvastatin (CRESTOR) 40 MG tablet Take 1 tablet (40 mg total) by mouth daily. 12/08/21  Yes Fritzi Mandes, MD  terazosin (HYTRIN) 5 MG capsule Take 1 capsule (5 mg total) by mouth at bedtime. 12/08/21  Yes Fritzi Mandes, MD  HYDROcodone-acetaminophen (NORCO/VICODIN) 5-325 MG tablet Take 1 tablet by mouth every 4 (four) hours as needed. Patient not taking: Reported on 12/26/2021 12/24/21   Harvest Dark, MD  polyethylene glycol (MIRALAX / GLYCOLAX) 17 g packet Take 17 g by mouth daily. Patient not taking: Reported on 12/26/2021 12/02/21  Loletha Grayer, MD    Allergies: Allergies  Allergen Reactions   Atorvastatin Other (See Comments)    Social History:  reports that he has never smoked. He has never used smokeless tobacco. He reports that he does not drink alcohol and does not use drugs.   Family History: Family History  Problem Relation Age of Onset   Hypertension Father    Healthy Sister     Review of Systems: Review of Systems  Constitutional:  Negative for chills and fever.  HENT:  Negative for hearing loss.   Respiratory:  Positive for shortness of breath.   Cardiovascular:  Positive  for chest pain.  Gastrointestinal:  Positive for abdominal pain. Negative for nausea and vomiting.  Genitourinary:  Negative for dysuria.  Musculoskeletal:  Negative for myalgias.  Skin:  Negative for rash.  Neurological:  Negative for dizziness.  Psychiatric/Behavioral:  Negative for depression.     Physical Exam BP 112/66   Pulse 86   Temp 98.4 F (36.9 C)   Resp 20   SpO2 95%  CONSTITUTIONAL: No acute distress HEENT:  Normocephalic, atraumatic, extraocular motion intact. NECK: Trachea is midline, and there is no jugular venous distension. RESPIRATORY:  Normal respiratory effort without pathologic use of accessory muscles. CARDIOVASCULAR:  Regular rhythm and rate GI: The abdomen is soft, appears mildly distended, but is not currently tender to palpation.  Negative Murphy's sign.  MUSCULOSKELETAL:  Normal muscle strength and tone in all four extremities.  No peripheral edema or cyanosis. SKIN: Skin turgor is normal. There are no pathologic skin lesions.  NEUROLOGIC:  Motor and sensation is grossly normal.  Cranial nerves are grossly intact. PSYCH:  Alert and oriented to person, place and time. Affect is normal.  Laboratory Analysis: Results for orders placed or performed during the hospital encounter of 12/26/21 (from the past 24 hour(s))  Lactic acid, plasma     Status: Abnormal   Collection Time: 12/26/21 11:19 AM  Result Value Ref Range   Lactic Acid, Venous 2.7 (HH) 0.5 - 1.9 mmol/L  Brain natriuretic peptide     Status: None   Collection Time: 12/26/21 11:19 AM  Result Value Ref Range   B Natriuretic Peptide 45.0 0.0 - 100.0 pg/mL  Protime-INR     Status: Abnormal   Collection Time: 12/26/21 11:19 AM  Result Value Ref Range   Prothrombin Time 39.9 (H) 11.4 - 15.2 seconds   INR 4.2 (HH) 0.8 - 1.2  APTT     Status: Abnormal   Collection Time: 12/26/21 11:19 AM  Result Value Ref Range   aPTT 53 (H) 24 - 36 seconds  Hepatic function panel     Status: Abnormal    Collection Time: 12/26/21 11:20 AM  Result Value Ref Range   Total Protein 7.3 6.5 - 8.1 g/dL   Albumin 2.9 (L) 3.5 - 5.0 g/dL   AST 44 (H) 15 - 41 U/L   ALT 21 0 - 44 U/L   Alkaline Phosphatase 59 38 - 126 U/L   Total Bilirubin 1.0 0.3 - 1.2 mg/dL   Bilirubin, Direct 0.2 0.0 - 0.2 mg/dL   Indirect Bilirubin 0.8 0.3 - 0.9 mg/dL  Basic metabolic panel     Status: Abnormal   Collection Time: 12/26/21 11:22 AM  Result Value Ref Range   Sodium 137 135 - 145 mmol/L   Potassium 5.0 3.5 - 5.1 mmol/L   Chloride 103 98 - 111 mmol/L   CO2 20 (L) 22 - 32 mmol/L  Glucose, Bld 142 (H) 70 - 99 mg/dL   BUN 62 (H) 8 - 23 mg/dL   Creatinine, Ser 2.66 (H) 0.61 - 1.24 mg/dL   Calcium 8.9 8.9 - 10.3 mg/dL   GFR, Estimated 24 (L) >60 mL/min   Anion gap 14 5 - 15  CBC     Status: Abnormal   Collection Time: 12/26/21 11:22 AM  Result Value Ref Range   WBC 31.2 (H) 4.0 - 10.5 K/uL   RBC 3.35 (L) 4.22 - 5.81 MIL/uL   Hemoglobin 9.1 (L) 13.0 - 17.0 g/dL   HCT 30.1 (L) 39.0 - 52.0 %   MCV 89.9 80.0 - 100.0 fL   MCH 27.2 26.0 - 34.0 pg   MCHC 30.2 30.0 - 36.0 g/dL   RDW 14.6 11.5 - 15.5 %   Platelets 313 150 - 400 K/uL   nRBC 0.0 0.0 - 0.2 %  Troponin I (High Sensitivity)     Status: Abnormal   Collection Time: 12/26/21 11:22 AM  Result Value Ref Range   Troponin I (High Sensitivity) 28 (H) <18 ng/L  Lactic acid, plasma     Status: None   Collection Time: 12/26/21  1:22 PM  Result Value Ref Range   Lactic Acid, Venous 1.7 0.5 - 1.9 mmol/L  Heparin level (unfractionated)     Status: Abnormal   Collection Time: 12/26/21  5:00 PM  Result Value Ref Range   Heparin Unfractionated >1.10 (H) 0.30 - 0.70 IU/mL    Imaging: US Abdomen Limited RUQ (LIVER/GB)  Result Date: 12/26/2021 CLINICAL DATA:  Pain EXAM: ULTRASOUND ABDOMEN LIMITED RIGHT UPPER QUADRANT COMPARISON:  CT chest abdomen and pelvis 12/24/2021 FINDINGS: Gallbladder: Gallbladder sludge is present adherent to the gallbladder wall. No  gallstones are seen. There is a small amount of pericholecystic fluid. The gallbladder wall is thickened measuring up to 5.4 mm. Sonographic Percell Miller sign is negative. Common bile duct: Diameter: 5.3 mm Liver: No focal lesion identified. Increase in parenchymal echogenicity. Limited evaluation of the left lobe secondary to overlying bowel gas. Portal vein is patent on color Doppler imaging with normal direction of blood flow towards the liver. Other: None. IMPRESSION: 1. Gallbladder sludge with gallbladder wall thickening and pericholecystic fluid. Negative sonographic Murphy sign. Please correlate clinically for acute or chronic acalculous cholecystitis. 2. Echogenic liver likely related to diffuse hepatocellular disease such as fatty infiltration. Electronically Signed   By: Ronney Asters M.D.   On: 12/26/2021 15:30   CT Chest Wo Contrast  Result Date: 12/26/2021 CLINICAL DATA:  Pneumonia EXAM: CT CHEST WITHOUT CONTRAST TECHNIQUE: Multidetector CT imaging of the chest was performed following the standard protocol without IV contrast. RADIATION DOSE REDUCTION: This exam was performed according to the departmental dose-optimization program which includes automated exposure control, adjustment of the mA and/or kV according to patient size and/or use of iterative reconstruction technique. COMPARISON:  Previous CT done on 12/24/2021 and previous chest radiographs including the examination done today FINDINGS: Cardiovascular: Coronary artery calcifications are seen. Scattered calcifications are seen in thoracic aorta. Minimal pericardial effusion is present. Mediastinum/Nodes: No significant lymphadenopathy seen in mediastinum. Lungs/Pleura: Small bilateral pleural effusions are seen, more so on the right side. There is interval increase in patchy infiltrates in both lower lung fields suggesting worsening multifocal atelectasis/pneumonia. Small patchy infiltrate is seen in posterior segment of right upper lobe. There  is no pneumothorax. Upper Abdomen: The gallbladder is distended. There is mild stranding in the fat planes adjacent to the gallbladder. There are smooth marginated  fluid density lesions in the visualized upper poles of both kidneys suggesting bilateral renal cysts. No follow-up imaging is recommended. Musculoskeletal: No acute findings are seen. Degenerative changes are noted in visualized lower cervical spine and thoracic spine. IMPRESSION: There is significant interval increase in infiltrates in both lower lung fields suggesting worsening of multifocal atelectasis/pneumonia. There is small infiltrate in the posterior segment of right upper lobe suggesting pneumonia. Small bilateral pleural effusions, more so on the right side with interval increase. Gallbladder is distended. There is mild stranding in the fat planes adjacent to the gallbladder. Possibility of acute cholecystitis is not excluded. Follow-up gallbladder sonogram may be considered. These results will be called to the ordering clinician or representative by the Radiologist Assistant, and communication documented in the PACS or [employer]. Coronary artery disease. Minimal pericardial effusion. Bilateral renal cysts. Electronically Signed   By: Elmer Picker M.D.   On: 12/26/2021 13:58   DG Chest 2 View  Result Date: 12/26/2021 CLINICAL DATA:  Shortness of breath EXAM: CHEST - 2 VIEW COMPARISON:  Previous studies including the examination of 12/24/2021 FINDINGS: Transverse diameter of heart is slightly increased. There is poor inspiration. Increased markings are seen in the lower lung fields. There are no signs of alveolar pulmonary edema. There is no significant pleural effusion or pneumothorax. Degenerative changes are noted in both shoulders, more so on the right side. IMPRESSION: Increased markings in both lower lung fields may suggest crowding of markings due to poor inspiration or subsegmental atelectasis/pneumonia. Electronically  Signed   By: Elmer Picker M.D.   On: 12/26/2021 11:45    Assessment and Plan: This is a 81 y.o. male with worsening bilateral pneumonia, and possible cholecystitis.  --The patient currently is not having pain, but his wife reports that he's been having RUQ pain recently and is part of the reason he came to ED two days ago.  U/S shows finding which could be suspicious for cholecystitis.  Discussed with them that it would be good to obtain a HIDA scan to evaluate the gallbladder for obstruction.  If positive, then that would mean he does have cholecystitis.  Given his bilateral pneumonia, being on anticoagulation, would defer surgery and ask radiology for percutaneous cholecystostomy drain placement.  If negative, then there's no cholecystitis and we can talk in outpatient setting once he's fully recovered about possible cholecystectomy. --Will make him NPO after midnight for HIDA scan tomorrow morning.  Have discussed with NM tech and informed medical team of plan.  I spent 60 minutes dedicated to the care of this patient on the date of this encounter to include pre-visit review of records, face-to-face time with the patient discussing diagnosis and management, and any post-visit coordination of care.   Melvyn Neth, MD [facility] Pg:  [phone]

## 2021-12-26 NOTE — Assessment & Plan Note (Signed)
-   Aspirin-Crestor

## 2021-12-26 NOTE — ED Provider Notes (Signed)
[facility] Provider Note   Event Date/Time   First MD Initiated Contact with Patient 12/26/21 1112     (approximate) History  Weakness  HPI Mark [name] is a 81 y.o. 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  Patient also endorses associated generalized weakness.  Patient also endorses intermittent abdominal pain over the last 2 weeks.  Also endorses subjective fever/chills beginning this morning.  EMS found patient's oxygen to be 88% on room air and applied 2 L nasal cannula ROS: Patient currently denies any vision changes, tinnitus, difficulty speaking, facial droop, sore throat, chest pain, nausea/vomiting/diarrhea, dysuria, or weakness/numbness/paresthesias in any extremity   Physical Exam  Triage Vital Signs: ED Triage Vitals  Enc Vitals Group     BP      Pulse      Resp      Temp      Temp src      SpO2      Weight      Height      Head Circumference      Peak Flow      Pain Score      Pain Loc      Pain Edu?      Excl. in [place]?    Most recent vital signs: Vitals:   12/26/21 1400 12/26/21 1600  BP: 110/70 112/66  Pulse: 87 86  Resp: 17 20  Temp: 98 F (36.7 C) 98.4 F (36.9 C)  SpO2: 94% 95%   General: Awake, oriented x4. CV:  Good peripheral perfusion.  Resp:  Normal effort.  2 L nasal cannula in place Abd:  No distention.  Other:  Elderly overweight African-American male laying in bed in no acute distress ED Results / Procedures / Treatments  Labs (all labs ordered are listed, but only abnormal results are displayed) Labs Reviewed  BASIC METABOLIC PANEL - Abnormal; Notable for the following components:      Result Value   CO2 20 (*)    Glucose, Bld 142 (*)    BUN 62 (*)    Creatinine, Ser 2.66 (*)    GFR, Estimated 24 (*)    All other components within  normal limits  CBC - Abnormal; Notable for the following components:   WBC 31.2 (*)    RBC 3.35 (*)    Hemoglobin 9.1 (*)    HCT 30.1 (*)    All other components within normal limits  LACTIC ACID, PLASMA - Abnormal; Notable for the following components:   Lactic Acid, Venous 2.7 (*)    All other components within normal limits  TROPONIN I (HIGH SENSITIVITY) - Abnormal; Notable for the following components:   Troponin I (High Sensitivity) 28 (*)    All other components within normal limits  CULTURE, BLOOD (ROUTINE X 2)  CULTURE, BLOOD (ROUTINE X 2)  SARS CORONAVIRUS 2 BY RT PCR  EXPECTORATED SPUTUM ASSESSMENT W GRAM STAIN, RFLX TO RESP C  LACTIC ACID, PLASMA  BRAIN NATRIURETIC PEPTIDE  URINALYSIS, ROUTINE W REFLEX MICROSCOPIC  HEPATIC FUNCTION PANEL  HEMOGLOBIN A1C  PROTIME-INR  APTT  PROCALCITONIN  STREP PNEUMONIAE URINARY ANTIGEN  LEGIONELLA PNEUMOPHILA SEROGP 1 UR AG  CBG MONITORING, ED  TROPONIN I (HIGH SENSITIVITY)   EKG ED ECG REPORT I, Naaman Plummer, the attending physician, personally viewed and interpreted this  ECG. Date: 12/26/2021 EKG Time: 1112 Rate: 90 Rhythm: normal sinus rhythm QRS Axis: normal Intervals: normal ST/T Wave abnormalities: normal Narrative Interpretation: no evidence of acute ischemia RADIOLOGY ED MD interpretation: 2 view x-ray of the chest interpreted by me and shows increased markings in bilateral lower lung field suggesting crowding versus poor inspiration versus subsegmental atelectasis versus pneumonia -Agree with radiology assessment Official radiology report(s): US Abdomen Limited RUQ (LIVER/GB)  Result Date: 12/26/2021 CLINICAL DATA:  Pain EXAM: ULTRASOUND ABDOMEN LIMITED RIGHT UPPER QUADRANT COMPARISON:  CT chest abdomen and pelvis 12/24/2021 FINDINGS: Gallbladder: Gallbladder sludge is present adherent to the gallbladder wall. No gallstones are seen. There is a small amount of pericholecystic fluid. The gallbladder wall is  thickened measuring up to 5.4 mm. Sonographic Percell Miller sign is negative. Common bile duct: Diameter: 5.3 mm Liver: No focal lesion identified. Increase in parenchymal echogenicity. Limited evaluation of the left lobe secondary to overlying bowel gas. Portal vein is patent on color Doppler imaging with normal direction of blood flow towards the liver. Other: None. IMPRESSION: 1. Gallbladder sludge with gallbladder wall thickening and pericholecystic fluid. Negative sonographic Murphy sign. Please correlate clinically for acute or chronic acalculous cholecystitis. 2. Echogenic liver likely related to diffuse hepatocellular disease such as fatty infiltration. Electronically Signed   By: Ronney Asters M.D.   On: 12/26/2021 15:30   CT Chest Wo Contrast  Result Date: 12/26/2021 CLINICAL DATA:  Pneumonia EXAM: CT CHEST WITHOUT CONTRAST TECHNIQUE: Multidetector CT imaging of the chest was performed following the standard protocol without IV contrast. RADIATION DOSE REDUCTION: This exam was performed according to the departmental dose-optimization program which includes automated exposure control, adjustment of the mA and/or kV according to patient size and/or use of iterative reconstruction technique. COMPARISON:  Previous CT done on 12/24/2021 and previous chest radiographs including the examination done today FINDINGS: Cardiovascular: Coronary artery calcifications are seen. Scattered calcifications are seen in thoracic aorta. Minimal pericardial effusion is present. Mediastinum/Nodes: No significant lymphadenopathy seen in mediastinum. Lungs/Pleura: Small bilateral pleural effusions are seen, more so on the right side. There is interval increase in patchy infiltrates in both lower lung fields suggesting worsening multifocal atelectasis/pneumonia. Small patchy infiltrate is seen in posterior segment of right upper lobe. There is no pneumothorax. Upper Abdomen: The gallbladder is distended. There is mild stranding in the  fat planes adjacent to the gallbladder. There are smooth marginated fluid density lesions in the visualized upper poles of both kidneys suggesting bilateral renal cysts. No follow-up imaging is recommended. Musculoskeletal: No acute findings are seen. Degenerative changes are noted in visualized lower cervical spine and thoracic spine. IMPRESSION: There is significant interval increase in infiltrates in both lower lung fields suggesting worsening of multifocal atelectasis/pneumonia. There is small infiltrate in the posterior segment of right upper lobe suggesting pneumonia. Small bilateral pleural effusions, more so on the right side with interval increase. Gallbladder is distended. There is mild stranding in the fat planes adjacent to the gallbladder. Possibility of acute cholecystitis is not excluded. Follow-up gallbladder sonogram may be considered. These results will be called to the ordering clinician or representative by the Radiologist Assistant, and communication documented in the PACS or [employer]. Coronary artery disease. Minimal pericardial effusion. Bilateral renal cysts. Electronically Signed   By: Elmer Picker M.D.   On: 12/26/2021 13:58   DG Chest 2 View  Result Date: 12/26/2021 CLINICAL DATA:  Shortness of breath EXAM: CHEST - 2 VIEW COMPARISON:  Previous studies including the examination of 12/24/2021 FINDINGS: Transverse diameter  of heart is slightly increased. There is poor inspiration. Increased markings are seen in the lower lung fields. There are no signs of alveolar pulmonary edema. There is no significant pleural effusion or pneumothorax. Degenerative changes are noted in both shoulders, more so on the right side. IMPRESSION: Increased markings in both lower lung fields may suggest crowding of markings due to poor inspiration or subsegmental atelectasis/pneumonia. Electronically Signed   By: Elmer Picker M.D.   On: 12/26/2021 11:45   PROCEDURES: Critical Care  performed: Yes, see critical care procedure note(s) .1-3 Lead EKG Interpretation  Performed by: Naaman Plummer, MD Authorized by: Naaman Plummer, MD     Interpretation: normal     ECG rate:  87   ECG rate assessment: normal     Rhythm: sinus rhythm     Ectopy: none     Conduction: normal   CRITICAL CARE Performed by: Naaman Plummer  Total critical care time: 35 minutes  Critical care time was exclusive of separately billable procedures and treating other patients.  Critical care was necessary to treat or prevent imminent or life-threatening deterioration.  Critical care was time spent personally by me on the following activities: development of treatment plan with patient and/or surrogate as well as nursing, discussions with consultants, evaluation of patient's response to treatment, examination of patient, obtaining history from patient or surrogate, ordering and performing treatments and interventions, ordering and review of laboratory studies, ordering and review of radiographic studies, pulse oximetry and re-evaluation of patient's condition.  MEDICATIONS ORDERED IN ED: Medications  lactated ringers infusion ( Intravenous New Bag/Given 12/26/21 1203)  albuterol (PROVENTIL) (2.5 MG/3ML) 0.083% nebulizer solution 3 mL (has no administration in time range)  dextromethorphan-guaiFENesin (MUCINEX DM) 30-600 MG per 12 hr tablet 1 tablet (has no administration in time range)  ondansetron (ZOFRAN) injection 4 mg (has no administration in time range)  acetaminophen (TYLENOL) tablet 650 mg (has no administration in time range)  hydrALAZINE (APRESOLINE) injection 5 mg (has no administration in time range)  morphine (PF) 2 MG/ML injection 1 mg (has no administration in time range)  sodium chloride 0.9 % bolus 1,000 mL (0 mLs Intravenous Stopped 12/26/21 1240)    And  sodium chloride 0.9 % bolus 1,000 mL (0 mLs Intravenous Stopped 12/26/21 1412)    And  sodium chloride 0.9 % bolus 500 mL (0 mLs  Intravenous Stopped 12/26/21 1412)   IMPRESSION / MDM / ASSESSMENT AND PLAN / ED COURSE  I reviewed the triage vital signs and the nursing notes.                             The patient is on the cardiac monitor to evaluate for evidence of arrhythmia and/or significant heart rate changes. Patient's presentation is most consistent with acute presentation with potential threat to life or bodily function. Presents with shortness of breath, cough, and malaise concerning for pneumonia.  DDx: PE, COPD exacerbation, Pneumothorax, TB, Atypical ACS, Esophageal Rupture, Toxic Exposure, Foreign Body Airway Obstruction.  Workup: CXR CBC, CMP, lactate, troponin  Given History, Exam, and Workup presentation most consistent with pneumonia.  Findings: Multifocal bilateral lower lobe Monia  Tx: Ceftriaxone 1g IV Azithromycin '500mg'$  IV  1555 Reassessment: As patient is continuing to require supplemental oxygenation for acute hypoxic respiratory failure, patient will require admission to the internal medicine service for further evaluation and management  Disposition: Admit   FINAL CLINICAL IMPRESSION(S) / ED DIAGNOSES  Final diagnoses:  Multifocal pneumonia  Acute respiratory failure with hypoxia ([place])  Sepsis with acute hypoxic respiratory failure without septic shock, due to unspecified organism [facility])   Rx / DC Orders   ED Discharge Orders     None      Note:  This document was prepared using Dragon voice recognition software and may include unintentional dictation errors.   Naaman Plummer, MD 12/26/21 [phone]

## 2021-12-26 NOTE — Assessment & Plan Note (Signed)
-   Crestor 

## 2021-12-26 NOTE — H&P (Signed)
History and Physical    Mark [name] QPY:[id number] DOB: 1941/02/18 DOA: 12/26/2021  Referring MD/NP/PA:   PCP: Rolley Sims, MD   Patient coming from:  The patient is coming from home.  At baseline, pt is independent for most of ADL.        Chief Complaint: Shortness of breath and abdominal pain  HPI: Mark [name] is a 81 y.o. 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  Patient states that he has SOB for more than 3 days, which has been progressively worsening.  Patient has cough with clear mucus production.  Patient has had mild frontal chest pain earlier, which has resolved.  Currently no active chest pain.  No fever or chills.  Patient also reports abdominal pain, which has been going on intermittently for more than 2 weeks.  The abdominal pain is located in central and upper abdomen, which is moderate, sharp, nonradiating.  Patient has nausea, no vomiting or diarrhea.  Denies symptoms of UTI.  Of note, pt was seen in ED on 9/7 and had CT angiogram which was negative for PE.  He also had CT scan of abdomen/pelvis which was negative for acute findings.  Data reviewed independently and ED Course: pt was found to have WBC 31.2, troponin level 28, BNP 45, worsening renal function, lactic acid 2.7, 1.7, temperature normal, blood pressure 88/50 which improved to 110/70 after giving 2.5 L normal saline in ED, heart rate 94, RR 25, oxygen saturation 94% on room air.  Patient is admitted to telemetry bed as inpatient.  CT of chest: There is significant interval increase in infiltrates in both lower lung fields suggesting worsening of multifocal atelectasis/pneumonia. There is small infiltrate in the posterior segment of right upper lobe suggesting pneumonia. Small bilateral pleural effusions, more  so on the right side with interval increase.   Gallbladder is distended. There is mild stranding in the fat planes adjacent to the gallbladder. Possibility of acute cholecystitis is not excluded. Follow-up gallbladder sonogram may be considered.   These results will be called to the ordering clinician or representative by the Radiologist Assistant, and communication documented in the PACS or [employer].   Coronary artery disease. Minimal pericardial effusion. Bilateral renal cysts.   US-RUQ: 1. Gallbladder sludge with gallbladder wall thickening and pericholecystic fluid. Negative sonographic Murphy sign. Please correlate clinically for acute or chronic acalculous cholecystitis. 2. Echogenic liver likely related to diffuse hepatocellular disease such as fatty infiltration.  EKG: I have personally reviewed.  Sinus rhythm, QTc 456, poor R wave progression, LAD, nonspecific T wave change.   Review of Systems:   General: no fevers, chills, no body weight gain, has poor appetite, has fatigue HEENT: no blurry vision, hearing changes or sore throat Respiratory: has dyspnea, coughing, no wheezing CV: had chest pain, no palpitations GI: has nausea and abdominal pain, no vomiting, diarrhea, constipation GU: no dysuria, burning on urination, increased urinary frequency, hematuria  Ext: no leg edema Neuro: no unilateral weakness, numbness, or tingling, no vision change or hearing loss Skin: no rash, no skin tear. MSK: No muscle spasm, no deformity, no limitation of range of movement in spin Heme: No easy bruising.  Travel history: No recent long distant travel.   Allergy:  Allergies  Allergen Reactions   Atorvastatin Other (See Comments)    Past Medical History:  Diagnosis  Date   Arthritis    Lower Back, Hips, and Hands.   Cancer [facility])    Prostate CA s/o Rad Tx   Chronic kidney disease    Coronary artery disease    Hypercholesterolemia    Hypertension    Radiation  09/30/2008-12/02/2010   7800 cGy  Dr.Ottelin   Stroke [facility])     Past Surgical History:  Procedure Laterality Date   CATARACT SURGERY     IVC FILTER INSERTION N/A 11/25/2021   Procedure: IVC FILTER INSERTION;  Surgeon: Algernon Huxley, MD;  Location: [place] [facility];  Service: Cardiovascular;  Laterality: N/A;   KNEE ARTHROSCOPY Left    QUADRICEPS TENDON REPAIR Right 12/15/2020   Procedure: REPAIR QUADRICEP TENDON;  Surgeon: Lovell Sheehan, MD;  Location: [facility] ORS;  Service: Orthopedics;  Laterality: Right;    Social History:  reports that he has never smoked. He has never used smokeless tobacco. He reports that he does not drink alcohol and does not use drugs.  Family History:  Family History  Problem Relation Age of Onset   Hypertension Father    Healthy Sister      Prior to Admission medications   Medication Sig Start Date End Date Taking? Authorizing Provider  acetaminophen (TYLENOL) 325 MG tablet Take 2 tablets (650 mg total) by mouth every 4 (four) hours as needed for mild pain (or temp > 37.5 C (99.5 F)). 12/02/21   Loletha Grayer, MD  amLODipine (NORVASC) 10 MG tablet Take 1 tablet (10 mg total) by mouth daily. 12/08/21   Fritzi Mandes, MD  apixaban (ELIQUIS) 5 MG TABS tablet Two tabs po wice a day for one week then one tab po twice a day afterwards 12/08/21   Fritzi Mandes, MD  aspirin EC 81 MG tablet Take 1 tablet (81 mg total) by mouth daily. Swallow whole. 12/03/21   Loletha Grayer, MD  gabapentin (NEURONTIN) 100 MG capsule Take 1 capsule (100 mg total) by mouth at bedtime. 11/22/21 01/21/22  Menshew, Dannielle Karvonen, PA-C  HYDROcodone-acetaminophen (NORCO/VICODIN) 5-325 MG tablet Take 1 tablet by mouth every 4 (four) hours as needed. 12/24/21   Harvest Dark, MD  lisinopril (ZESTRIL) 40 MG tablet Take 1 tablet (40 mg total) by mouth daily. 12/08/21   Fritzi Mandes, MD  polyethylene glycol (MIRALAX / GLYCOLAX) 17 g packet Take 17 g by mouth daily. 12/02/21   Loletha Grayer, MD   rosuvastatin (CRESTOR) 40 MG tablet Take 1 tablet (40 mg total) by mouth daily. 12/08/21   Fritzi Mandes, MD  terazosin (HYTRIN) 5 MG capsule Take 1 capsule (5 mg total) by mouth at bedtime. 12/08/21   Fritzi Mandes, MD    Physical Exam: Vitals:   12/26/21 1120 12/26/21 1242 12/26/21 1400 12/26/21 1600  BP:  106/66 110/70 112/66  Pulse: 93 89 87 86  Resp: (!) '25 17 17 20  '$ Temp:  97.8 F (36.6 C) 98 F (36.7 C) 98.4 F (36.9 C)  TempSrc:  Oral    SpO2: 94% 98% 94% 95%   General: pt is in acute distress due to shortness of breath and abdominal pain HEENT:       Eyes: PERRL, EOMI, no scleral icterus.       ENT: No discharge from the ears and nose, no pharynx injection, no tonsillar enlargement.        Neck: No JVD, no bruit, no mass felt. Heme: No neck lymph node enlargement. Cardiac: S1/S2, RRR, No murmurs, No gallops or rubs. Respiratory: has  coarse breathing sound bilaterally GI: Mildly distended, has tenderness in central and upper abdomen, no rebound pain, no organomegaly, BS present. GU: No hematuria Ext: No pitting leg edema bilaterally. 1+DP/PT pulse bilaterally. Musculoskeletal: No joint deformities, No joint redness or warmth, no limitation of ROM in spin. Skin: No rashes.  Neuro: Alert, oriented X3, cranial nerves II-XII grossly intact, moves all extremities normally.  Psych: Patient is not psychotic, no suicidal or hemocidal ideation.  Labs on Admission: I have personally reviewed following labs and imaging studies  CBC: Recent Labs  Lab 12/21/21 0245 12/24/21 0306 12/26/21 1122  WBC 10.1 10.2 31.2*  HGB 10.8* 11.0* 9.1*  HCT 35.9* 36.1* 30.1*  MCV 90.7 90.3 89.9  PLT 287 275 301   Basic Metabolic Panel: Recent Labs  Lab 12/21/21 0245 12/24/21 0306 12/26/21 1122  NA 136 139 137  K 3.9 3.8 5.0  CL 105 104 103  CO2 25 23 20*  GLUCOSE 112* 153* 142*  BUN 25* 25* 62*  CREATININE 1.46* 1.51* 2.66*  CALCIUM 9.8 9.1 8.9   GFR: Estimated Creatinine  Clearance: 20 mL/min (A) (by C-G formula based on SCr of 2.66 mg/dL (H)). Liver Function Tests: Recent Labs  Lab 12/21/21 0245 12/24/21 0306 12/26/21 1120  AST 25 28 44*  ALT '17 14 21  '$ ALKPHOS 61 59 59  BILITOT 0.7 0.8 1.0  PROT 8.3* 7.7 7.3  ALBUMIN 3.9 3.4* 2.9*   Recent Labs  Lab 12/21/21 0245 12/24/21 0306  LIPASE 62* 59*   No results for input(s): "AMMONIA" in the last 168 hours. Coagulation Profile: Recent Labs  Lab 12/26/21 1119  INR 4.2*   Cardiac Enzymes: No results for input(s): "CKTOTAL", "CKMB", "CKMBINDEX", "TROPONINI" in the last 168 hours. BNP (last 3 results) No results for input(s): "PROBNP" in the last 8760 hours. HbA1C: No results for input(s): "HGBA1C" in the last 72 hours. CBG: No results for input(s): "GLUCAP" in the last 168 hours. Lipid Profile: No results for input(s): "CHOL", "HDL", "LDLCALC", "TRIG", "CHOLHDL", "LDLDIRECT" in the last 72 hours. Thyroid Function Tests: No results for input(s): "TSH", "T4TOTAL", "FREET4", "T3FREE", "THYROIDAB" in the last 72 hours. Anemia Panel: No results for input(s): "VITAMINB12", "FOLATE", "FERRITIN", "TIBC", "IRON", "RETICCTPCT" in the last 72 hours. Urine analysis:    Component Value Date/Time   COLORURINE STRAW (A) 12/03/2021 0621   APPEARANCEUR CLEAR (A) 12/03/2021 0621   LABSPEC 1.004 (L) 12/03/2021 0621   PHURINE 6.0 12/03/2021 0621   GLUCOSEU NEGATIVE 12/03/2021 0621   HGBUR NEGATIVE 12/03/2021 0621   BILIRUBINUR NEGATIVE 12/03/2021 0621   KETONESUR NEGATIVE 12/03/2021 0621   PROTEINUR NEGATIVE 12/03/2021 0621   NITRITE NEGATIVE 12/03/2021 0621   LEUKOCYTESUR NEGATIVE 12/03/2021 0621   Sepsis Labs: '@LABRCNTIP'$ (procalcitonin:4,lacticidven:4) )No results found for this or any previous visit (from the past 240 hour(s)).   Radiological Exams on Admission: US Abdomen Limited RUQ (LIVER/GB)  Result Date: 12/26/2021 CLINICAL DATA:  Pain EXAM: ULTRASOUND ABDOMEN LIMITED RIGHT UPPER QUADRANT  COMPARISON:  CT chest abdomen and pelvis 12/24/2021 FINDINGS: Gallbladder: Gallbladder sludge is present adherent to the gallbladder wall. No gallstones are seen. There is a small amount of pericholecystic fluid. The gallbladder wall is thickened measuring up to 5.4 mm. Sonographic Percell Miller sign is negative. Common bile duct: Diameter: 5.3 mm Liver: No focal lesion identified. Increase in parenchymal echogenicity. Limited evaluation of the left lobe secondary to overlying bowel gas. Portal vein is patent on color Doppler imaging with normal direction of blood flow towards the liver. Other: None. IMPRESSION:  1. Gallbladder sludge with gallbladder wall thickening and pericholecystic fluid. Negative sonographic Murphy sign. Please correlate clinically for acute or chronic acalculous cholecystitis. 2. Echogenic liver likely related to diffuse hepatocellular disease such as fatty infiltration. Electronically Signed   By: Ronney Asters M.D.   On: 12/26/2021 15:30   CT Chest Wo Contrast  Result Date: 12/26/2021 CLINICAL DATA:  Pneumonia EXAM: CT CHEST WITHOUT CONTRAST TECHNIQUE: Multidetector CT imaging of the chest was performed following the standard protocol without IV contrast. RADIATION DOSE REDUCTION: This exam was performed according to the departmental dose-optimization program which includes automated exposure control, adjustment of the mA and/or kV according to patient size and/or use of iterative reconstruction technique. COMPARISON:  Previous CT done on 12/24/2021 and previous chest radiographs including the examination done today FINDINGS: Cardiovascular: Coronary artery calcifications are seen. Scattered calcifications are seen in thoracic aorta. Minimal pericardial effusion is present. Mediastinum/Nodes: No significant lymphadenopathy seen in mediastinum. Lungs/Pleura: Small bilateral pleural effusions are seen, more so on the right side. There is interval increase in patchy infiltrates in both lower lung  fields suggesting worsening multifocal atelectasis/pneumonia. Small patchy infiltrate is seen in posterior segment of right upper lobe. There is no pneumothorax. Upper Abdomen: The gallbladder is distended. There is mild stranding in the fat planes adjacent to the gallbladder. There are smooth marginated fluid density lesions in the visualized upper poles of both kidneys suggesting bilateral renal cysts. No follow-up imaging is recommended. Musculoskeletal: No acute findings are seen. Degenerative changes are noted in visualized lower cervical spine and thoracic spine. IMPRESSION: There is significant interval increase in infiltrates in both lower lung fields suggesting worsening of multifocal atelectasis/pneumonia. There is small infiltrate in the posterior segment of right upper lobe suggesting pneumonia. Small bilateral pleural effusions, more so on the right side with interval increase. Gallbladder is distended. There is mild stranding in the fat planes adjacent to the gallbladder. Possibility of acute cholecystitis is not excluded. Follow-up gallbladder sonogram may be considered. These results will be called to the ordering clinician or representative by the Radiologist Assistant, and communication documented in the PACS or [employer]. Coronary artery disease. Minimal pericardial effusion. Bilateral renal cysts. Electronically Signed   By: Elmer Picker M.D.   On: 12/26/2021 13:58   DG Chest 2 View  Result Date: 12/26/2021 CLINICAL DATA:  Shortness of breath EXAM: CHEST - 2 VIEW COMPARISON:  Previous studies including the examination of 12/24/2021 FINDINGS: Transverse diameter of heart is slightly increased. There is poor inspiration. Increased markings are seen in the lower lung fields. There are no signs of alveolar pulmonary edema. There is no significant pleural effusion or pneumothorax. Degenerative changes are noted in both shoulders, more so on the right side. IMPRESSION: Increased  markings in both lower lung fields may suggest crowding of markings due to poor inspiration or subsegmental atelectasis/pneumonia. Electronically Signed   By: Elmer Picker M.D.   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  Lactic acid 2.7 --> 1.7. - Will admit to med-surg bed as inpt - IV Vancomycin and Zosyn  (Zosyn will also cover possible cholecystitis) - Mucinex for cough  - Bronchodilators - Urine legionella and S. pneumococcal antigen  - Follow up blood culture x2, sputum culture and Covid19 PCR - will get Procalcitonin - IVF: 2.5L of NS bolus in ED, followed by 75 mL per hour of LR   Severe sepsis ([place]) -see above  Abdominal pain CT scan showed gallbladder distention.  RUQ ultrasound showed possible cholecystitis.  Consulted Dr. [place] Abbot for general surgery. -Patient is on Zosyn -HIDA scan is ordered by Dr. [place] Abbot -As needed Zofran and morphine -IV fluid as above  Hypercholesterolemia - Crestor  Acute renal failure superimposed on stage 3a chronic kidney disease ([place]) Baseline creatinine 1.2- 1.4.  His creatinine is at 2.66, BUN 62.  Likely due to dehydration and continuation of lisinopril -Hold lisinopril -IV fluid as above -Follow-up by BMP  Hypertension - IV hydralazine as needed -Hold home lisinopril and amlodipine since patient is at high risk of developing hypotension due to severe sepsis  DVT (deep venous thrombosis)  (HCC) - Switch Eliquis to IV heparin in case patient needed surgery  Hx of pulmonary embolus - Switch Eliquis to IV heparin in case patient needed surgery  CVA (cerebral vascular accident) ([place]) - Aspirin-Crestor  CAD (coronary artery disease) History of CAD and myocardial injury due to severe sepsis: Troponin level 28 -Trend troponin -Heparin, Crestor -Check A1c, FLP  Myocardial injury - See above          DVT ppx: IV Heparin  Code Status: Full code  Family Communication:  Yes, patient's wife   at bed side.       Disposition Plan:  Anticipate discharge back to previous environment  Consults called: Dr. [place] Abbot for surgery  Admission status and Level of care: Telemetry Medical:    as inpt        Dispo: The patient is from: Home              Anticipated d/c is to: Home              Anticipated d/c date is: 2 days              Patient currently is not medically stable to d/c.    Severity of Illness:  The appropriate patient status for this patient is INPATIENT. Inpatient status is judged to be reasonable and necessary in order to provide the required intensity of service to ensure the patient's safety. The patient's presenting symptoms, physical exam findings, and initial radiographic and laboratory data in the context of their chronic comorbidities is felt to place them at high risk for further clinical deterioration. Furthermore, it is not anticipated that the patient will be medically stable for discharge from the hospital within 2 midnights of admission.   * I certify that at the point of admission it is my clinical judgment that the patient will require inpatient hospital care spanning beyond 2 midnights from the point of admission due to high intensity of service, high risk for further deterioration and high frequency of surveillance required.*       Date of Service 12/26/2021    Ivor Costa [facility]   If 7PM-7AM, please contact  night-coverage [email or web address] 12/26/2021, 6:56 PM

## 2021-12-26 NOTE — Consult Note (Signed)
ANTICOAGULATION CONSULT NOTE - Initial Consult  Pharmacy Consult for heparin infusion Indication: pulmonary embolus  Allergies  Allergen Reactions   Atorvastatin Other (See Comments)    Patient Measurements:   Heparin Dosing Weight: 76.3kg  Vital Signs: Temp: 98.4 F (36.9 C) (09/09 1600) Temp Source: Oral (09/09 1242) BP: 112/66 (09/09 1600) Pulse Rate: 86 (09/09 1600)  Labs: Recent Labs    12/24/21 0306 12/24/21 0637 12/26/21 1122  HGB 11.0*  --  9.1*  HCT 36.1*  --  30.1*  PLT 275  --  313  CREATININE 1.51*  --  2.66*  TROPONINIHS 8 7 28*    Estimated Creatinine Clearance: 20 mL/min (A) (by C-G formula based on SCr of 2.66 mg/dL (H)).   Medical History: Past Medical History:  Diagnosis Date   Arthritis    Lower Back, Hips, and Hands.   Cancer [facility])    Prostate CA s/o Rad Tx   Chronic kidney disease    Coronary artery disease    Hypercholesterolemia    Hypertension    Radiation 09/30/2008-12/02/2010   7800 cGy  Dr.Ottelin   Stroke [facility])     Medications:  PTA: Eliquis '5mg'$  BID. Last dose taken on 9/9 in the morning.  Inpatient: Heparin infusion (9/9 >>) Allergies: No AC/APT related allergies  Assessment: 81 y.o. male with a past medical history of arthritis, CAD, hypertension, hyperlipidemia, prior CVA who presents to the emergency department for abdominal pain as well as chest pain. Patient taking Eliquis PTA for DVT diagnosed on 8/17.   Goal of Therapy:  Heparin level 0.3-0.7 units/ml Monitor platelets by anticoagulation protocol: Yes    Date Time aPTT/HL Rate/Comment   Plan:  Order aPTT stat Tonight at 2000, give heparin IV 4500 units bolus x1; then start heparin infusion at 1300 units/hr Check aPTT/Anti-Xa level in 8 hours and daily once consecutively therapeutic.  Titrate by aPTT's until lab correlation is noted, then titrate by anti-xa alone. Continue to monitor H&H and platelets daily while on heparin gtt.  Darrick Penna 12/26/2021,4:27 PM

## 2021-12-26 NOTE — Consult Note (Signed)
Pharmacy Antibiotic Note  Mark [name] is a 81 y.o. male admitted on 12/26/2021 with  HCAP and possible acute cholecystitis .  Pharmacy has been consulted for zosyn and vancomycin dosing.  Plan: Give Zosyn 2.25g IV every 8 hours Give Vancomycin '1750mg'$  IV once followed by vancomycin variable dosing protocol.  Reassess serum creatinine with AM labs. Order random vancomycin tomorrow morning at 10am.  Continue to monitor and dose adjust antibiotics according to renal function and indication  Temp (24hrs), Avg:98.3 F (36.8 C), Min:97.8 F (36.6 C), Max:98.8 F (37.1 C)  Recent Labs  Lab 12/21/21 0245 12/24/21 0306 12/26/21 1119 12/26/21 1122 12/26/21 1322  WBC 10.1 10.2  --  31.2*  --   CREATININE 1.46* 1.51*  --  2.66*  --   LATICACIDVEN  --   --  2.7*  --  1.7    Estimated Creatinine Clearance: 20 mL/min (A) (by C-G formula based on SCr of 2.66 mg/dL (H)).    Allergies  Allergen Reactions   Atorvastatin Other (See Comments)    Antimicrobials this admission: 9/9 ceftriaxone + azithromycin IV x1 ED >>  9/9 Zosyn >> 9/9 Vancomycin >    Microbiology results: 9/9 BCx: sent 9/9 Sputum: sent  9/9 MRSA PCR: sent  Thank you for allowing pharmacy to be a part of this patient's care.  Darrick Penna 12/26/2021 4:56 PM

## 2021-12-26 NOTE — Assessment & Plan Note (Signed)
CT scan showed gallbladder distention.  RUQ ultrasound showed possible cholecystitis.  Consulted Dr. Hampton Abbot for general surgery. -Patient is on Zosyn -HIDA scan is ordered by Dr. Hampton Abbot -As needed Zofran and morphine -IV fluid as above

## 2021-12-26 NOTE — Progress Notes (Signed)
CODE SEPSIS - PHARMACY COMMUNICATION  **Broad Spectrum Antibiotics should be administered within 1 hour of Sepsis diagnosis**  Time Code Sepsis Called/Page Received: 1154  Antibiotics Ordered: ceftriaxone, azithromycin  Time of 1st antibiotic administration: 1145  Additional action taken by pharmacy: N/A  Gretel Acre, PharmD PGY1 Pharmacy Resident 12/26/2021 11:56 AM

## 2021-12-26 NOTE — Assessment & Plan Note (Signed)
-   Switch Eliquis to IV heparin in case patient needed surgery

## 2021-12-26 NOTE — ED Triage Notes (Signed)
Pt from home via EMS- c/o intermittent SHOB that starts at 2 am, generalized weakness, and intermittent abdominal pain x2 weeks. Wife told EMS he has a cardiologist but can't see him until October. Pt had stroke in June and wife states he has not been the same since.  Pt is AOX4, is denying pain. Respiration are even and unlabored- per ems pt has bouts of hyperventilation that drop oxygen to 88% BP 80/50 HR 95-96 irregular

## 2021-12-26 NOTE — Assessment & Plan Note (Signed)
-   IV hydralazine as needed -Hold home lisinopril and amlodipine since patient is at high risk of developing hypotension due to severe sepsis

## 2021-12-26 NOTE — Assessment & Plan Note (Signed)
History of CAD and myocardial injury due to severe sepsis: Troponin level 28 -Trend troponin -Heparin, Crestor -Check A1c, FLP

## 2021-12-27 ENCOUNTER — Encounter: Payer: Self-pay | Admitting: Internal Medicine

## 2021-12-27 ENCOUNTER — Inpatient Hospital Stay: Payer: No Typology Code available for payment source

## 2021-12-27 DIAGNOSIS — I82433 Acute embolism and thrombosis of popliteal vein, bilateral: Secondary | ICD-10-CM | POA: Diagnosis not present

## 2021-12-27 DIAGNOSIS — N179 Acute kidney failure, unspecified: Secondary | ICD-10-CM | POA: Diagnosis not present

## 2021-12-27 DIAGNOSIS — J9601 Acute respiratory failure with hypoxia: Secondary | ICD-10-CM

## 2021-12-27 DIAGNOSIS — A419 Sepsis, unspecified organism: Secondary | ICD-10-CM | POA: Diagnosis not present

## 2021-12-27 DIAGNOSIS — J189 Pneumonia, unspecified organism: Secondary | ICD-10-CM | POA: Diagnosis not present

## 2021-12-27 LAB — BASIC METABOLIC PANEL
Anion gap: 9 (ref 5–15)
BUN: 73 mg/dL — ABNORMAL HIGH (ref 8–23)
CO2: 21 mmol/L — ABNORMAL LOW (ref 22–32)
Calcium: 7.9 mg/dL — ABNORMAL LOW (ref 8.9–10.3)
Chloride: 107 mmol/L (ref 98–111)
Creatinine, Ser: 3.39 mg/dL — ABNORMAL HIGH (ref 0.61–1.24)
GFR, Estimated: 18 mL/min — ABNORMAL LOW (ref >60)
Glucose, Bld: 122 mg/dL — ABNORMAL HIGH (ref 70–99)
Potassium: 4.7 mmol/L (ref 3.5–5.1)
Sodium: 137 mmol/L (ref 135–145)

## 2021-12-27 LAB — CBC
HCT: 25.3 % — ABNORMAL LOW (ref 39.0–52.0)
Hemoglobin: 7.9 g/dL — ABNORMAL LOW (ref 13.0–17.0)
MCH: 27.6 pg (ref 26.0–34.0)
MCHC: 31.2 g/dL (ref 30.0–36.0)
MCV: 88.5 fL (ref 80.0–100.0)
Platelets: 288 10*3/uL (ref 150–400)
RBC: 2.86 MIL/uL — ABNORMAL LOW (ref 4.22–5.81)
RDW: 14.7 % (ref 11.5–15.5)
WBC: 25.3 10*3/uL — ABNORMAL HIGH (ref 4.0–10.5)
nRBC: 0 % (ref 0.0–0.2)

## 2021-12-27 LAB — LIPID PANEL
Cholesterol: 103 mg/dL (ref 0–200)
HDL: 46 mg/dL (ref >40)
LDL Cholesterol: 47 mg/dL (ref 0–99)
Total CHOL/HDL Ratio: 2.2 RATIO
Triglycerides: 50 mg/dL (ref <150)
VLDL: 10 mg/dL (ref 0–40)

## 2021-12-27 LAB — BRAIN NATRIURETIC PEPTIDE: B Natriuretic Peptide: 48.2 pg/mL (ref 0.0–100.0)

## 2021-12-27 LAB — VANCOMYCIN, RANDOM: Vancomycin Rm: 19 ug/mL

## 2021-12-27 LAB — PHOSPHORUS: Phosphorus: 4.9 mg/dL — ABNORMAL HIGH (ref 2.5–4.6)

## 2021-12-27 LAB — HEPATIC FUNCTION PANEL
ALT: 45 U/L — ABNORMAL HIGH (ref 0–44)
AST: 85 U/L — ABNORMAL HIGH (ref 15–41)
Albumin: 2.5 g/dL — ABNORMAL LOW (ref 3.5–5.0)
Alkaline Phosphatase: 64 U/L (ref 38–126)
Bilirubin, Direct: 0.2 mg/dL (ref 0.0–0.2)
Indirect Bilirubin: 0.2 mg/dL — ABNORMAL LOW (ref 0.3–0.9)
Total Bilirubin: 0.4 mg/dL (ref 0.3–1.2)
Total Protein: 6.5 g/dL (ref 6.5–8.1)

## 2021-12-27 LAB — HEPARIN LEVEL (UNFRACTIONATED): Heparin Unfractionated: >1.1 IU/mL — ABNORMAL HIGH (ref 0.30–0.70)

## 2021-12-27 LAB — GLUCOSE, CAPILLARY
Glucose-Capillary: 119 mg/dL — ABNORMAL HIGH (ref 70–99)
Glucose-Capillary: 126 mg/dL — ABNORMAL HIGH (ref 70–99)

## 2021-12-27 LAB — STREP PNEUMONIAE URINARY ANTIGEN: Strep Pneumo Urinary Antigen: NEGATIVE

## 2021-12-27 LAB — APTT: aPTT: 98 seconds — ABNORMAL HIGH (ref 24–36)

## 2021-12-27 LAB — MAGNESIUM: Magnesium: 2 mg/dL (ref 1.7–2.4)

## 2021-12-27 MED ORDER — MORPHINE SULFATE (PF) 2 MG/ML IV SOLN: 2.0000 mg | Freq: Once | INTRAVENOUS | Status: AC

## 2021-12-27 MED ORDER — ACETAMINOPHEN 650 MG RE SUPP: 650.0000 mg | Freq: Four times a day (QID) | RECTAL | Status: DC | PRN

## 2021-12-27 MED ORDER — ORAL CARE MOUTH RINSE: 15.0000 mL | OROMUCOSAL | Status: DC | PRN

## 2021-12-27 MED ORDER — CHLORHEXIDINE GLUCONATE CLOTH 2 % EX PADS
6.0000 | MEDICATED_PAD | Freq: Every day | CUTANEOUS | Status: DC
Start: 2021-12-27 — End: 2021-12-27
  Administered 2021-12-27: 6 via TOPICAL

## 2021-12-27 MED ORDER — SODIUM BICARBONATE 8.4 % IV SOLN: 100.0000 meq | Freq: Once | INTRAVENOUS | Status: DC

## 2021-12-27 MED ORDER — MORPHINE SULFATE (PF) 2 MG/ML IV SOLN
2.0000 mg | Freq: Once | INTRAVENOUS | Status: AC
  Administered 2021-12-27: 2 mg via INTRAVENOUS

## 2021-12-27 MED ORDER — SODIUM BICARBONATE 8.4 % IV SOLN
50.0000 meq | Freq: Once | INTRAVENOUS | Status: AC
  Administered 2021-12-27: 50 meq via INTRAVENOUS
  Filled 2021-12-27: qty 50

## 2021-12-27 MED ORDER — IPRATROPIUM-ALBUTEROL 0.5-2.5 (3) MG/3ML IN SOLN: 3.0000 mL | RESPIRATORY_TRACT | Status: DC

## 2021-12-27 MED ORDER — MORPHINE SULFATE (PF) 2 MG/ML IV SOLN
2.0000 mg | INTRAVENOUS | Status: DC | PRN
  Administered 2021-12-28: 2 mg via INTRAVENOUS
  Filled 2021-12-27: qty 1

## 2021-12-27 MED ORDER — GLYCOPYRROLATE 0.2 MG/ML IJ SOLN: 0.2000 mg | INTRAMUSCULAR | Status: DC | PRN

## 2021-12-27 MED ORDER — HALOPERIDOL LACTATE 5 MG/ML IJ SOLN
0.5000 mg | INTRAMUSCULAR | Status: DC | PRN
Start: 2021-12-27 — End: 2021-12-28

## 2021-12-27 MED ORDER — ACETAMINOPHEN 325 MG PO TABS: 650.0000 mg | ORAL_TABLET | Freq: Four times a day (QID) | ORAL | Status: DC | PRN

## 2021-12-27 MED ORDER — ONDANSETRON 4 MG PO TBDP: 4.0000 mg | ORAL_TABLET | Freq: Four times a day (QID) | ORAL | Status: DC | PRN

## 2021-12-27 MED ORDER — LACTATED RINGERS IV BOLUS
250.0000 mL | Freq: Once | INTRAVENOUS | Status: AC
  Administered 2021-12-27: 250 mL via INTRAVENOUS

## 2021-12-27 MED ORDER — BUDESONIDE 0.5 MG/2ML IN SUSP: 0.5000 mg | Freq: Two times a day (BID) | RESPIRATORY_TRACT | Status: DC

## 2021-12-27 MED ORDER — PNEUMOCOCCAL 20-VAL CONJ VACC 0.5 ML IM SUSY: 0.5000 mL | PREFILLED_SYRINGE | INTRAMUSCULAR | Status: DC

## 2021-12-27 MED ORDER — HALOPERIDOL 0.5 MG PO TABS
0.5000 mg | ORAL_TABLET | ORAL | Status: DC | PRN
Start: 2021-12-27 — End: 2021-12-28

## 2021-12-27 MED ORDER — BIOTENE DRY MOUTH MT LIQD: 15.0000 mL | OROMUCOSAL | Status: DC | PRN

## 2021-12-27 MED ORDER — LACTATED RINGERS IV BOLUS
1000.0000 mL | Freq: Once | INTRAVENOUS | Status: AC
  Administered 2021-12-27: 1000 mL via INTRAVENOUS

## 2021-12-27 MED ORDER — HALOPERIDOL LACTATE 2 MG/ML PO CONC
0.5000 mg | ORAL | Status: DC | PRN
Start: 2021-12-27 — End: 2021-12-28

## 2021-12-27 MED ORDER — MORPHINE SULFATE (PF) 2 MG/ML IV SOLN
INTRAVENOUS | Status: AC
  Administered 2021-12-27: 2 mg via INTRAVENOUS
  Filled 2021-12-27: qty 1

## 2021-12-27 MED ORDER — GLYCOPYRROLATE 1 MG PO TABS: 1.0000 mg | ORAL_TABLET | ORAL | Status: DC | PRN

## 2021-12-27 MED ORDER — METHYLPREDNISOLONE SODIUM SUCC 40 MG IJ SOLR: 40.0000 mg | Freq: Two times a day (BID) | INTRAMUSCULAR | Status: DC

## 2021-12-27 MED ORDER — GLYCOPYRROLATE 0.2 MG/ML IJ SOLN
0.2000 mg | INTRAMUSCULAR | Status: DC | PRN
  Administered 2021-12-27: 0.2 mg via INTRAVENOUS
  Filled 2021-12-27: qty 1

## 2021-12-27 MED ORDER — METHYLPREDNISOLONE SODIUM SUCC 40 MG IJ SOLR
40.0000 mg | Freq: Two times a day (BID) | INTRAMUSCULAR | Status: DC
  Administered 2021-12-27: 40 mg via INTRAVENOUS
  Filled 2021-12-27: qty 1

## 2021-12-27 MED ORDER — POLYVINYL ALCOHOL 1.4 % OP SOLN: 1.0000 [drp] | Freq: Four times a day (QID) | OPHTHALMIC | Status: DC | PRN

## 2021-12-27 MED ORDER — INFLUENZA VAC A&B SA ADJ QUAD 0.5 ML IM PRSY: 0.5000 mL | PREFILLED_SYRINGE | INTRAMUSCULAR | Status: DC

## 2021-12-27 MED ORDER — ONDANSETRON HCL 4 MG/2ML IJ SOLN: 4.0000 mg | Freq: Four times a day (QID) | INTRAMUSCULAR | Status: DC | PRN

## 2021-12-27 NOTE — Progress Notes (Signed)
Patient has not had urine output during the shift. Bladder scanned and 161m of urine is present. Provider on call made aware. 250ML LR bolus administered. Labs drawn and to results. Will monitor for deterioration of renal labs.

## 2021-12-27 NOTE — IPAL (Addendum)
  Interdisciplinary Goals of Care Family Meeting   Date carried out: 12/27/2021  Location of the meeting: Bedside  Member's involved: Physician and Family Member or next of kin    GOALS OF CARE DISCUSSION  The Clinical status was relayed to family in detail- Wife, Sister and Daughter  Updated and notified of patients medical condition- Patient with increased WOB and using accessory muscles to breathe Severe wheezing Severe hypoxia Severe chest pain  Explained to family course of therapy and the modalities   Patient with Progressive multiorgan failure with a very high probablity of a very minimal chance of meaningful recovery despite all aggressive and optimal medical therapy.    Family understands the situation.  They have consented and agreed to DNR/DNI status and also would NOT want Hemodialysis, his QOL has significantly declined over last few months  THEY HAVE ALL AGREED [place]    Family are satisfied with Plan of action and management. All questions answered  Additional CC time 55 mins   Caitland Porchia Patricia Pesa, M.D.  [facility]  Medical Director [place] Director [facility] Cardio-Pulmonary Department

## 2021-12-27 NOTE — Progress Notes (Signed)
[facility] [place] NAME: Mark [name]    MR#:  [id number]  DATE OF BIRTH:  1940-09-12  SUBJECTIVE:   Patient transferred earlier in the ICU with respiratory distress. He was having a lot of respiratory secretions and oxygen requirement went up. Was seen by ICU attending Dr. Christel Mormon. Patient has multiorgan failure and overall declining. Discussion was led with patient's family and now he is comfort care with DNR DNI. Met with family in the ICU.  VITALS:  Blood pressure (!) 92/48, pulse 76, temperature 98.1 F (36.7 C), temperature source Axillary, resp. rate 15, height 5' 6"  (1.676 m), weight 84.5 kg, SpO2 100 %.  PHYSICAL EXAMINATION:  limited due to patient being comfort care GENERAL:  81 y.o.-year-old patient lying in the bed with respiratory acute distress. Appears weak and deconditioned LUNGS: Normal breath sounds bilaterally CARDIOVASCULAR: S1, S2 normal.  LABORATORY PANEL:  CBC Recent Labs  Lab 12/27/21 0432  WBC 25.3*  HGB 7.9*  HCT 25.3*  PLT 288    Chemistries  Recent Labs  Lab 12/27/21 0432  NA 137  K 4.7  CL 107  CO2 21*  GLUCOSE 122*  BUN 73*  CREATININE 3.39*  CALCIUM 7.9*  MG 2.0  AST 85*  ALT 45*  ALKPHOS 64  BILITOT 0.4   Cardiac Enzymes No results for input(s): "TROPONINI" in the last 168 hours. RADIOLOGY:  DG Chest Port 1 View  Result Date: 12/27/2021 CLINICAL DATA:  Acute respiratory failure. EXAM: PORTABLE CHEST 1 VIEW COMPARISON:  12/26/2020 FINDINGS: Stable cardiomediastinal contours. Low lung volumes. Very low lung volumes. Unchanged small right pleural effusion. Bibasilar opacities are again noted and appear unchanged from the previous exam. IMPRESSION: 1. Persistent low lung volumes with bibasilar pulmonary opacities compatible with atelectasis and airspace disease. 2. Small right pleural effusion unchanged. Electronically Signed   By: Kerby Moors M.D.   On: 12/27/2021 08:40   US  Abdomen Limited RUQ (LIVER/GB)  Result Date: 12/26/2021 CLINICAL DATA:  Pain EXAM: ULTRASOUND ABDOMEN LIMITED RIGHT UPPER QUADRANT COMPARISON:  CT chest abdomen and pelvis 12/24/2021 FINDINGS: Gallbladder: Gallbladder sludge is present adherent to the gallbladder wall. No gallstones are seen. There is a small amount of pericholecystic fluid. The gallbladder wall is thickened measuring up to 5.4 mm. Sonographic Percell Miller sign is negative. Common bile duct: Diameter: 5.3 mm Liver: No focal lesion identified. Increase in parenchymal echogenicity. Limited evaluation of the left lobe secondary to overlying bowel gas. Portal vein is patent on color Doppler imaging with normal direction of blood flow towards the liver. Other: None. IMPRESSION: 1. Gallbladder sludge with gallbladder wall thickening and pericholecystic fluid. Negative sonographic Murphy sign. Please correlate clinically for acute or chronic acalculous cholecystitis. 2. Echogenic liver likely related to diffuse hepatocellular disease such as fatty infiltration. Electronically Signed   By: Ronney Asters M.D.   On: 12/26/2021 15:30   CT Chest Wo Contrast  Result Date: 12/26/2021 CLINICAL DATA:  Pneumonia EXAM: CT CHEST WITHOUT CONTRAST TECHNIQUE: Multidetector CT imaging of the chest was performed following the standard protocol without IV contrast. RADIATION DOSE REDUCTION: This exam was performed according to the departmental dose-optimization program which includes automated exposure control, adjustment of the mA and/or kV according to patient size and/or use of iterative reconstruction technique. COMPARISON:  Previous CT done on 12/24/2021 and previous chest radiographs including the examination done today FINDINGS: Cardiovascular: Coronary artery calcifications are seen. Scattered calcifications are seen in thoracic aorta. Minimal pericardial effusion is  present. Mediastinum/Nodes: No significant lymphadenopathy seen in mediastinum. Lungs/Pleura: Small  bilateral pleural effusions are seen, more so on the right side. There is interval increase in patchy infiltrates in both lower lung fields suggesting worsening multifocal atelectasis/pneumonia. Small patchy infiltrate is seen in posterior segment of right upper lobe. There is no pneumothorax. Upper Abdomen: The gallbladder is distended. There is mild stranding in the fat planes adjacent to the gallbladder. There are smooth marginated fluid density lesions in the visualized upper poles of both kidneys suggesting bilateral renal cysts. No follow-up imaging is recommended. Musculoskeletal: No acute findings are seen. Degenerative changes are noted in visualized lower cervical spine and thoracic spine. IMPRESSION: There is significant interval increase in infiltrates in both lower lung fields suggesting worsening of multifocal atelectasis/pneumonia. There is small infiltrate in the posterior segment of right upper lobe suggesting pneumonia. Small bilateral pleural effusions, more so on the right side with interval increase. Gallbladder is distended. There is mild stranding in the fat planes adjacent to the gallbladder. Possibility of acute cholecystitis is not excluded. Follow-up gallbladder sonogram may be considered. These results will be called to the ordering clinician or representative by the Radiologist Assistant, and communication documented in the PACS or [employer]. Coronary artery disease. Minimal pericardial effusion. Bilateral renal cysts. Electronically Signed   By: Elmer Picker M.D.   On: 12/26/2021 13:58   DG Chest 2 View  Result Date: 12/26/2021 CLINICAL DATA:  Shortness of breath EXAM: CHEST - 2 VIEW COMPARISON:  Previous studies including the examination of 12/24/2021 FINDINGS: Transverse diameter of heart is slightly increased. There is poor inspiration. Increased markings are seen in the lower lung fields. There are no signs of alveolar pulmonary edema. There is no significant  pleural effusion or pneumothorax. Degenerative changes are noted in both shoulders, more so on the right side. IMPRESSION: Increased markings in both lower lung fields may suggest crowding of markings due to poor inspiration or subsegmental atelectasis/pneumonia. Electronically Signed   By: Elmer Picker M.D.   On: 12/26/2021 11:45    Assessment and Plan Mark [name] is a 81 y.o. 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  Patient states that he has SOB for more than 3 days, which has been progressively worsening.  Patient has cough with clear mucus production.    CT of chest: There is significant interval increase in infiltrates in both lower lung fields suggesting worsening of multifocal atelectasis/pneumonia. There is small infiltrate in the posterior segment of right upper lobe suggesting pneumonia. Small bilateral pleural effusions, more so on the right side with interval increase.   Gallbladder is distended. There is mild stranding in the fat planes adjacent to the gallbladder. Possibility of acute cholecystitis is not excluded. Follow-up gallbladder sonogram may be considered.   US-RUQ: 1. Gallbladder sludge with gallbladder wall thickening and pericholecystic fluid. Negative sonographic Murphy sign. Please correlate clinically for acute or chronic acalculous cholecystitis. 2. Echogenic liver likely related to diffuse hepatocellular disease such as fatty infiltration.  Severe sepsis due to multifocal pneumonia with multiorgan failure and worsening acute on chronic hypoxic respiratory failure -- patient was on 2 L nasal cannula oxygen. Oxygen requirement went up to 4 L and then non-rebreather. Transiently was transferred to ICU. Long discussion was held by ICU attending with family and  given for overall prognosis patient was changed to DNR  DNI per family request. Patient -- patient looked like was having a lot of oral secretions. And high risk for aspiration. -- He is now comfort care.  Acute cholecystitis acute renal failure superimposed on stage IIIa  History of DVT and PE  recent history of CVA with hemorrhage and conversion  history of CAD and MI  overall poor prognosis. Continue comfort care measures. Hospice liaison contacted to see patient tomorrow    Family communication :wife and dter at bedside Consults :ICU MD CODE STATUS: DNR/DNI DVT Prophylaxis :none--comfort care Level of care: Med-Surg Status is: Inpatient Remains inpatient appropriate because: comfort care    TOTAL TIME TAKING CARE OF THIS PATIENT: 35 minutes.  >50% time spent on counselling and coordination of care  Note: This dictation was prepared with Dragon dictation along with smaller phrase technology. Any transcriptional errors that result from this process are unintentional.  Fritzi Mandes M.D    [facility]   CC: Primary care physician; Rolley Sims, MD

## 2021-12-27 NOTE — Progress Notes (Signed)
   12/27/21 1000  Clinical Encounter Type  Visited With Patient and family together  Visit Type Initial;Critical Care;Patient actively dying  Referral From Nurse  Consult/Referral To Chaplain  Spiritual Encounters  Spiritual Needs Prayer;Grief support   Chaplain responded to call to provide EOL care and support.

## 2021-12-27 NOTE — Consult Note (Signed)
ANTICOAGULATION CONSULT NOTE  Pharmacy Consult for heparin infusion Indication: pulmonary embolus  Allergies  Allergen Reactions   Atorvastatin Other (See Comments)    Patient Measurements: Height: '5\' 6"'$  (167.6 cm) Weight: 81.3 kg (179 lb 3.7 oz) IBW/kg (Calculated) : 63.8 Heparin Dosing Weight: 76.3kg  Vital Signs: Temp: 98.2 F (36.8 C) (09/10 0437) Temp Source: Oral (09/10 0437) BP: 109/69 (09/10 0437) Pulse Rate: 83 (09/10 0437)  Labs: Recent Labs    12/26/21 1119 12/26/21 1122 12/26/21 1700 12/26/21 1841 12/26/21 2003 12/27/21 0432  HGB  --  9.1*  --   --   --  7.9*  HCT  --  30.1*  --   --   --  25.3*  PLT  --  313  --   --   --  288  APTT 53*  --   --   --   --  98*  LABPROT 39.9*  --   --   --   --   --   INR 4.2*  --   --   --   --   --   HEPARINUNFRC  --   --  >1.10*  --   --  >1.10*  CREATININE  --  2.66*  --   --   --  3.39*  TROPONINIHS  --  28*  --  24* 41*  --      Estimated Creatinine Clearance: 17.4 mL/min (A) (by C-G formula based on SCr of 3.39 mg/dL (H)).   Medical History: Past Medical History:  Diagnosis Date   Arthritis    Lower Back, Hips, and Hands.   Cancer [facility])    Prostate CA s/o Rad Tx   Chronic kidney disease    Coronary artery disease    Hypercholesterolemia    Hypertension    Radiation 09/30/2008-12/02/2010   7800 cGy  Dr.Ottelin   Stroke [facility])     Medications:  PTA: Eliquis '5mg'$  BID. Last dose taken on 9/9 in the morning.  Inpatient: Heparin infusion (9/9 >>) Allergies: No AC/APT related allergies  Assessment: 81 y.o. male with a past medical history of arthritis, CAD, hypertension, hyperlipidemia, prior CVA who presents to the emergency department for abdominal pain as well as chest pain. Patient taking Eliquis PTA for DVT diagnosed on 8/17.   Goal of Therapy:  Heparin level 0.3-0.7 units/ml Monitor platelets by anticoagulation protocol: Yes    Date Time aPTT/HL Rate/Comment 9/10 0432 98 / >1.10 Thera x  1/Supra  Plan:  Continue heparin infusion at 1300 units/hr Recheck aPTT level in 8 hr to confirm  Titrate by aPTT's until lab correlation is noted, then titrate by anti-xa alone. HL & CBC daily while on heparin  Renda Rolls, PharmD, [facility] 12/27/2021 5:58 AM

## 2021-12-27 NOTE — Progress Notes (Signed)
12/27/21  Patient deteriorated earlier this morning with much increased work of breathing and shortness of breath.  He was initially planned for HIDA scan this morning, but it was canceled due to worsening clinical status.  He's transferred to ICU now and is DNR/DNI.  Discussed with Dr. Posey Pronto.  For now, surgical team will sign off and HIDA scan can be canceled.  We're available if any questions or concerns.  Olean Ree, MD

## 2021-12-27 NOTE — Consult Note (Signed)
NAME:  Mark [name], MRN:  [id number], DOB:  1940-08-05, LOS: 1 ADMISSION DATE:  12/26/2021, CONSULTATION DATE: 12/27/21 REFERRING MD: Dr. Posey Pronto, CHIEF COMPLAINT: Abdominal Pain  History of Present Illness:  This is an 81 yo male who presented to [facility] ER via EMS on 09/9 with c/o intermittent shortness of breath, intermittent abdominal pain, and generalized weakness.  He stated the intermittent abdominal pain started 2 weeks prior to ER presentation.  EMS reported when they arrived on the scene pt had bouts of hyperventilation with hypoxia O2 sats 88% on RA requiring 2L O2 via nasal canula.  Pt previously hospitalized at [facility] on 08/17 to 08/22 with acute CVA, right sided- PE, and bilateral lower extremity DVT (despite IVC filter).  He was discharged on eliquis.    ED Course Upon arrival to the ER CXR revealed subsegmental atelectasis/ pneumonia.  Lab results: CO2 20, glucose 142, BUN 62, creatinine 2.66, albumin 2.9, AST 44, troponin 28, wbc 31.2, hgb 9.1, and PT 39.9/INR 4.2.  Pt received ceftriaxone and azithromycin.    CT Chest 09/9: There is significant interval increase in infiltrates in both lower lung fields suggesting worsening of multifocal atelectasis/pneumonia. There is small infiltrate in the posterior segment of right upper lobe suggesting pneumonia. Small bilateral pleural effusions, more so on the right side with interval increase. Gallbladder is distended. There is mild stranding in the fat planes adjacent to the gallbladder. Possibility of acute cholecystitis is not excluded. Follow-up gallbladder sonogram may be considered.  [place] Abd Limited RUQ 09/9: Gallbladder sludge with gallbladder wall thickening and pericholecystic fluid. Negative sonographic Murphy sign. Please correlate clinically for acute or chronic acalculous cholecystitis. Echogenic liver likely related to diffuse hepatocellular disease such as fatty infiltration.  Pt subsequently admitted to the medsurg unit per  hospitalist team for additional workup and treatment.  See detailed hospital course under significant events.    Pertinent  Medical History  Arthritis  Prostate Cancer s/p radiation  CAD CKD Hypercholesterolemia  HTN CVA  Intracranial Hemorrhage  Right PE DVT  PVD  Significant Hospital Events: Including procedures, antibiotic start and stop dates in addition to other pertinent events   09/9: Pt admitted to the medsurg unit with sepsis secondary to bilateral pneumonia and possible cholecystitis  09/9: General surgery consulted for possible cholecystitis recommended HIDA scan to evaluate the gallbladder for obstruction  09/10: Pt developed worsening acute on chronic renal failure with oliguria received iv fluid resuscitation  09/10: Pt developed worsening acute hypoxic respiratory failure requiring 40% venti mask and transfer to the [place] [facility].  PCCM team consulted to assist with management.  Following ICU Intensivist goals of care discussion with pts family code status changed to DNR/DNI   Interim History / Subjective:  Tachypnea has improved following administration of 4 mg iv morphine and 1 amp of sodium bicarb.  Pt remains on 40% venturi mask with O2 sats '@96'$ %  Objective   Blood pressure 106/84, pulse 87, temperature 98.4 F (36.9 C), temperature source Oral, resp. rate (!) 25, height '5\' 6"'$  (1.676 m), weight 81.3 kg, SpO2 100 %.    FiO2 (%):  [55 %] 55 %   Intake/Output Summary (Last 24 hours) at 12/27/2021 0820 Last data filed at 12/27/2021 0734 Gross per 24 hour  Intake 7120.23 ml  Output 200 ml  Net 6920.23 ml   Filed Weights   12/26/21 1950  Weight: 81.3 kg    Examination: General: Acutely-ill appearing male, resting in bed, NAD on 40% venturi mask  HENT: Supple, mild JVD  Lungs: Rhonchi with wheezing throughout, mild tachypnea Cardiovascular: NSR, rrr, s1s2, no r/g, 2+ radial/1+ distal pulses, no edema  Abdomen: +BS x4, obese, mild distension, taut, tender   Extremities: Moves all extremities although weak  Neuro: Alert but disoriented to time, follows commands, PERRLA  GU: Indwelling foley catheter draining yellow urine   Resolved Hospital Problem list     Assessment & Plan:  Acute hypoxic respiratory failure secondary to pneumonia and atelectasis  - Supplemental O2 for dyspnea and/or hypoxia  - Maintain O2 sats >92% - IV and nebulized steroids  - Scheduled and prn bronchodilator therapy  - Aggressive pulmonary hygiene   Hyperlipidemia Elevated troponin likely demand ischemia in the setting sepsis  Hx: CVA, Right-sided PE, CAD, Dyslipidemia, and DVT - Continuous telemetry monitoring  - Continue aspirin  - Will hold rosuvastatin due to elevated liver enzymes  - Continue heparin gtt   Acute on superimposed stage 3a chronic kidney disease with compensated metabolic acidosis  - Trend BMP  - Replace electrolytes as indicated  - Monitor UOP - Avoid nephrotoxic medications - Will give 1 amp of sodium bicarb   Transaminitis likely in the setting of sepsis  - Trend hepatic function panel  - Avoid hepatotoxic medications   Leukocytosis secondary to pneumonia and possible cholecystitis  - Trend WBC and monitor fever curve  - Trend PCT  - Follow cultures  - Continue zosyn - General surgery consulted appreciate input: once stable from respiratory standpoint proceed with HIDA scan to further evaluation of gallbladder   Anemia without signs of active bleeding  - Trend CBC  - Monitor for s/sx of bleeding  - Transfuse for hgb <7  Abdominal pain  - Prn morphine if pain uncontrolled will start low dose morphine gtt  Best Practice (right click and "Reselect all SmartList Selections" daily)   Diet/type: NPO DVT prophylaxis: systemic heparin GI prophylaxis: N/A Lines: N/A Foley:  Yes, and it is still needed Code Status:  DNR Last date of multidisciplinary goals of care discussion [N/A]  Labs   CBC: Recent Labs  Lab  12/21/21 0245 12/24/21 0306 12/26/21 1122 12/27/21 0432  WBC 10.1 10.2 31.2* 25.3*  HGB 10.8* 11.0* 9.1* 7.9*  HCT 35.9* 36.1* 30.1* 25.3*  MCV 90.7 90.3 89.9 88.5  PLT 287 275 313 323    Basic Metabolic Panel: Recent Labs  Lab 12/21/21 0245 12/24/21 0306 12/26/21 1122 12/27/21 0432  NA 136 139 137 137  K 3.9 3.8 5.0 4.7  CL 105 104 103 107  CO2 25 23 20* 21*  GLUCOSE 112* 153* 142* 122*  BUN 25* 25* 62* 73*  CREATININE 1.46* 1.51* 2.66* 3.39*  CALCIUM 9.8 9.1 8.9 7.9*   GFR: Estimated Creatinine Clearance: 17.4 mL/min (A) (by C-G formula based on SCr of 3.39 mg/dL (H)). Recent Labs  Lab 12/21/21 0245 12/24/21 0306 12/26/21 1119 12/26/21 1122 12/26/21 1322 12/26/21 1841 12/27/21 0432  PROCALCITON  --   --   --   --   --  24.83  --   WBC 10.1 10.2  --  31.2*  --   --  25.3*  LATICACIDVEN  --   --  2.7*  --  1.7  --   --     Liver Function Tests: Recent Labs  Lab 12/21/21 0245 12/24/21 0306 12/26/21 1120 12/27/21 0432  AST 25 28 44* 85*  ALT '17 14 21 '$ 45*  ALKPHOS 61 59 59 64  BILITOT 0.7 0.8 1.0 0.4  PROT 8.3* 7.7 7.3 6.5  ALBUMIN 3.9 3.4* 2.9* 2.5*   Recent Labs  Lab 12/21/21 0245 12/24/21 0306  LIPASE 62* 59*   No results for input(s): "AMMONIA" in the last 168 hours.  ABG    Component Value Date/Time   TCO2 29 10/22/2010 0708     Coagulation Profile: Recent Labs  Lab 12/26/21 1119  INR 4.2*    Cardiac Enzymes: No results for input(s): "CKTOTAL", "CKMB", "CKMBINDEX", "TROPONINI" in the last 168 hours.  HbA1C: Hgb A1c MFr Bld  Date/Time Value Ref Range Status  12/26/2021 06:41 PM 4.7 (L) 4.8 - 5.6 % Final    Comment:    (NOTE) Pre diabetes:          5.7%-6.4%  Diabetes:              >6.4%  Glycemic control for   <7.0% adults with diabetes   11/24/2021 09:33 AM 3.8 (L) 4.8 - 5.6 % Final    Comment:    (NOTE) Pre diabetes:          5.7%-6.4%  Diabetes:              >6.4%  Glycemic control for   <7.0% adults with  diabetes     CBG: Recent Labs  Lab 12/26/21 2022 12/27/21 0742  GLUCAP 151* 119*    Review of Systems: Positives in BOLD   Gen: Denies fever, chills, weight change, fatigue, night sweats HEENT: Denies blurred vision, double vision, hearing loss, tinnitus, sinus congestion, rhinorrhea, sore throat, neck stiffness, dysphagia PULM: shortness of breath, cough, sputum production, hemoptysis, wheezing CV: Denies chest pain, edema, orthopnea, paroxysmal nocturnal dyspnea, palpitations GI: abdominal pain, nausea, vomiting, diarrhea, hematochezia, melena, constipation, change in bowel habits GU: Denies dysuria, hematuria, polyuria, oliguria, urethral discharge Endocrine: Denies hot or cold intolerance, polyuria, polyphagia or appetite change Derm: Denies rash, dry skin, scaling or peeling skin change Heme: Denies easy bruising, bleeding, bleeding gums Neuro: Denies headache, numbness, weakness, slurred speech, loss of memory or consciousness  Past Medical History:  He,  has a past medical history of Arthritis, Cancer ([place]), Chronic kidney disease, Coronary artery disease, Hypercholesterolemia, Hypertension, Radiation (09/30/2008-12/02/2010), and Stroke ([place]).   Surgical History:   Past Surgical History:  Procedure Laterality Date   CATARACT SURGERY     IVC FILTER INSERTION N/A 11/25/2021   Procedure: IVC FILTER INSERTION;  Surgeon: Algernon Huxley, MD;  Location: [facility];  Service: Cardiovascular;  Laterality: N/A;   KNEE ARTHROSCOPY Left    QUADRICEPS TENDON REPAIR Right 12/15/2020   Procedure: REPAIR QUADRICEP TENDON;  Surgeon: Lovell Sheehan, MD;  Location: [facility];  Service: Orthopedics;  Laterality: Right;     Social History:   reports that he has never smoked. He has never used smokeless tobacco. He reports that he does not drink alcohol and does not use drugs.   Family History:  His family history includes Healthy in his sister; Hypertension in his father.    Allergies Allergies  Allergen Reactions   Atorvastatin Other (See Comments)     Home Medications  Prior to Admission medications   Medication Sig Start Date End Date Taking? Authorizing Provider  acetaminophen (TYLENOL) 325 MG tablet Take 2 tablets (650 mg total) by mouth every 4 (four) hours as needed for mild pain (or temp > 37.5 C (99.5 F)). 12/02/21  Yes Wieting, Richard, MD  amLODipine (NORVASC) 10 MG tablet Take 1 tablet (10 mg total) by mouth daily. 12/08/21  Yes  Fritzi Mandes, MD  apixaban (ELIQUIS) 5 MG TABS tablet Two tabs po wice a day for one week then one tab po twice a day afterwards 12/08/21  Yes Fritzi Mandes, MD  aspirin EC 81 MG tablet Take 1 tablet (81 mg total) by mouth daily. Swallow whole. 12/03/21  Yes Wieting, Richard, MD  gabapentin (NEURONTIN) 100 MG capsule Take 1 capsule (100 mg total) by mouth at bedtime. 11/22/21 01/21/22 Yes Menshew, Dannielle Karvonen, PA-C  lisinopril (ZESTRIL) 40 MG tablet Take 1 tablet (40 mg total) by mouth daily. 12/08/21  Yes Fritzi Mandes, MD  rosuvastatin (CRESTOR) 40 MG tablet Take 1 tablet (40 mg total) by mouth daily. 12/08/21  Yes Fritzi Mandes, MD  terazosin (HYTRIN) 5 MG capsule Take 1 capsule (5 mg total) by mouth at bedtime. 12/08/21  Yes Fritzi Mandes, MD  HYDROcodone-acetaminophen (NORCO/VICODIN) 5-325 MG tablet Take 1 tablet by mouth every 4 (four) hours as needed. Patient not taking: Reported on 12/26/2021 12/24/21   Harvest Dark, MD  polyethylene glycol (MIRALAX / GLYCOLAX) 17 g packet Take 17 g by mouth daily. Patient not taking: Reported on 12/26/2021 12/02/21   Loletha Grayer, MD     Critical care time: 60 minutes     Donell Beers, [place] Pager [phone] (please enter 7 digits) PCCM Consult Pager [phone] (please enter 7 digits)

## 2021-12-27 NOTE — Progress Notes (Signed)
[employer] - nurse reports no urine output and only 189m noted on bladder scan  Action - 250 LR bolus Requested am labs stat creatinine up to 3.39 from 2.66 GFR worsening 18 from 24 Additional liter LR bolus ordered.  Increase MIVF rate to 125 Hepatic function panel added to blood in lab Requested pharmacy for review current med doses with worsening of renal function and optimize Repeat renal function panel at 1400

## 2021-12-28 ENCOUNTER — Encounter: Admission: RE | Payer: Self-pay | Source: Home / Self Care

## 2021-12-28 ENCOUNTER — Ambulatory Visit
Admission: RE | Admit: 2021-12-28 | Payer: No Typology Code available for payment source | Source: Home / Self Care | Admitting: Vascular Surgery

## 2021-12-28 DIAGNOSIS — J189 Pneumonia, unspecified organism: Secondary | ICD-10-CM | POA: Diagnosis not present

## 2021-12-28 DIAGNOSIS — Z515 Encounter for palliative care: Secondary | ICD-10-CM

## 2021-12-28 SURGERY — IVC FILTER REMOVAL
Anesthesia: Moderate Sedation

## 2021-12-28 MED ORDER — GLYCOPYRROLATE 1 MG PO TABS: 1.0000 mg | ORAL_TABLET | ORAL | 0 refills | Status: AC | PRN

## 2021-12-28 MED ORDER — MORPHINE SULFATE 10 MG/5ML PO SOLN: 10.0000 mg | ORAL | Status: DC | PRN

## 2021-12-28 MED ORDER — MORPHINE SULFATE (CONCENTRATE) 10 MG/0.5ML PO SOLN: 10.0000 mg | ORAL | 0 refills | Status: AC | PRN

## 2021-12-28 NOTE — Progress Notes (Signed)
     Referral received for [name] for goals of care discussion. Chart reviewed and updates received from hospitalist Dr. Posey Pronto.  The patient is on comfort care and has been accepted to residential hospice with [facility].  Discharge summary has been posted, anticipate discharge today.  I reached out to the hospitalist Dr. Posey Pronto who indicates no further palliative needs at this time, consult discontinued.  Please reach out to [place] if any further palliative needs.  PatientThank you for your referral and allowing [facility] to assist in [place] care.   Walden Field, NP Palliative Medicine Team Phone: [phone]  NO CHARGE

## 2021-12-28 NOTE — Discharge Summary (Signed)
Physician Discharge Summary   Patient: Mark [name] MRN: [id number] DOB: 02-23-1941  Admit date:     12/26/2021  Discharge date: 12/28/21  Discharge Physician: Fritzi Mandes   PCP: Mark Sims, MD   Discharge Diagnoses: Severe Sepsis with Multifocal pneumonia and respiratory failure  Hospital Course:  Mark [name] is a 81 y.o. 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  Patient states that he has SOB for more than 3 days, which has been progressively worsening.  Patient has cough with clear mucus production.     CT of chest: There is significant interval increase in infiltrates in both lower lung fields suggesting worsening of multifocal atelectasis/pneumonia. There is small infiltrate in the posterior segment of right upper lobe suggesting pneumonia. Small bilateral pleural effusions, more so on the right side with interval increase.   Gallbladder is distended. There is mild stranding in the fat planes adjacent to the gallbladder. Possibility of acute cholecystitis is not excluded. Follow-up gallbladder sonogram may be considered.   US-RUQ: 1. Gallbladder sludge with gallbladder wall thickening and pericholecystic fluid. Negative sonographic Murphy sign. Please correlate clinically for acute or chronic acalculous cholecystitis. 2. Echogenic liver likely related to diffuse hepatocellular disease such as fatty infiltration.   Severe sepsis due to multifocal pneumonia with multiorgan failure and worsening acute on chronic hypoxic respiratory failure -- patient was on 2 L nasal cannula oxygen. Oxygen requirement went up to 4 L and then non-rebreather. Transiently was transferred to ICU. Long discussion was held by ICU attending with family and given for overall prognosis patient was  changed to DNR DNI per family request. Patient -- patient looked like was having a lot of oral secretions. And high risk for aspiration. -- He is now comfort care.   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 Continue comfort care measures. Hospice contacted.. Pt will discharge to hospice facility today       Family communication :wife and dter at bedside Consults :ICU MD CODE STATUS: DNR/DNI  Disposition: Hospice care Diet recommendation:  Discharge Diet Orders (From admission, onward)     Start     Ordered   12/28/21 0000  Diet - low sodium heart healthy        12/28/21 1145            DISCHARGE MEDICATION: Allergies as of 12/28/2021       Reactions   Atorvastatin Other (See Comments)        Medication List     STOP taking these medications    amLODipine 10 MG tablet Commonly known as: NORVASC   apixaban 5 MG Tabs tablet Commonly known as: ELIQUIS   aspirin EC 81 MG tablet   gabapentin 100 MG capsule Commonly known as: Neurontin   HYDROcodone-acetaminophen 5-325 MG tablet Commonly known as: NORCO/VICODIN   lisinopril 40 MG tablet Commonly known as: ZESTRIL   polyethylene glycol 17 g packet Commonly known as: MIRALAX / GLYCOLAX   rosuvastatin 40 MG tablet Commonly known as: CRESTOR   terazosin 5 MG capsule Commonly known as: HYTRIN       TAKE these medications    acetaminophen 325 MG tablet Commonly known as:  TYLENOL Take 2 tablets (650 mg total) by mouth every 4 (four) hours as needed for mild pain (or temp > 37.5 C (99.5 F)).   glycopyrrolate 1 MG tablet Commonly known as: ROBINUL Take 1 tablet (1 mg total) by mouth every 4 (four) hours as needed (excessive secretions).   morphine CONCENTRATE 10 MG/0.5ML Soln concentrated solution Take 0.5 mLs (10 mg total) by mouth every 2  (two) hours as needed for severe pain.        Discharge Exam: [name] Weights   12/26/21 1950 12/27/21 0900  Weight: 81.3 kg 84.5 kg     Condition at discharge: poor  The results of significant diagnostics from this hospitalization (including imaging, microbiology, ancillary and laboratory) are listed below for reference.   Imaging Studies: DG Chest Port 1 View  Result Date: 12/27/2021 CLINICAL DATA:  Acute respiratory failure. EXAM: PORTABLE CHEST 1 VIEW COMPARISON:  12/26/2020 FINDINGS: Stable cardiomediastinal contours. Low lung volumes. Very low lung volumes. Unchanged small right pleural effusion. Bibasilar opacities are again noted and appear unchanged from the previous exam. IMPRESSION: 1. Persistent low lung volumes with bibasilar pulmonary opacities compatible with atelectasis and airspace disease. 2. Small right pleural effusion unchanged. Electronically Signed   By: Kerby Moors M.D.   On: 12/27/2021 08:40   US Abdomen Limited RUQ (LIVER/GB)  Result Date: 12/26/2021 CLINICAL DATA:  Pain EXAM: ULTRASOUND ABDOMEN LIMITED RIGHT UPPER QUADRANT COMPARISON:  CT chest abdomen and pelvis 12/24/2021 FINDINGS: Gallbladder: Gallbladder sludge is present adherent to the gallbladder wall. No gallstones are seen. There is a small amount of pericholecystic fluid. The gallbladder wall is thickened measuring up to 5.4 mm. Sonographic Percell Miller sign is negative. Common bile duct: Diameter: 5.3 mm Liver: No focal lesion identified. Increase in parenchymal echogenicity. Limited evaluation of the left lobe secondary to overlying bowel gas. Portal vein is patent on color Doppler imaging with normal direction of blood flow towards the liver. Other: None. IMPRESSION: 1. Gallbladder sludge with gallbladder wall thickening and pericholecystic fluid. Negative sonographic Murphy sign. Please correlate clinically for acute or chronic acalculous cholecystitis. 2. Echogenic liver likely related to diffuse  hepatocellular disease such as fatty infiltration. Electronically Signed   By: Ronney Asters M.D.   On: 12/26/2021 15:30   CT Chest Wo Contrast  Result Date: 12/26/2021 CLINICAL DATA:  Pneumonia EXAM: CT CHEST WITHOUT CONTRAST TECHNIQUE: Multidetector CT imaging of the chest was performed following the standard protocol without IV contrast. RADIATION DOSE REDUCTION: This exam was performed according to the departmental dose-optimization program which includes automated exposure control, adjustment of the mA and/or kV according to patient size and/or use of iterative reconstruction technique. COMPARISON:  Previous CT done on 12/24/2021 and previous chest radiographs including the examination done today FINDINGS: Cardiovascular: Coronary artery calcifications are seen. Scattered calcifications are seen in thoracic aorta. Minimal pericardial effusion is present. Mediastinum/Nodes: No significant lymphadenopathy seen in mediastinum. Lungs/Pleura: Small bilateral pleural effusions are seen, more so on the right side. There is interval increase in patchy infiltrates in both lower lung fields suggesting worsening multifocal atelectasis/pneumonia. Small patchy infiltrate is seen in posterior segment of right upper lobe. There is no pneumothorax. Upper Abdomen: The gallbladder is distended. There is mild stranding in the fat planes adjacent to the gallbladder. There are smooth marginated fluid density lesions in the visualized upper poles of both kidneys suggesting bilateral renal cysts. No follow-up imaging is recommended. Musculoskeletal: No acute findings are seen. Degenerative changes are noted in visualized lower cervical spine and thoracic  spine. IMPRESSION: There is significant interval increase in infiltrates in both lower lung fields suggesting worsening of multifocal atelectasis/pneumonia. There is small infiltrate in the posterior segment of right upper lobe suggesting pneumonia. Small bilateral pleural  effusions, more so on the right side with interval increase. Gallbladder is distended. There is mild stranding in the fat planes adjacent to the gallbladder. Possibility of acute cholecystitis is not excluded. Follow-up gallbladder sonogram may be considered. These results will be called to the ordering clinician or representative by the Radiologist Assistant, and communication documented in the PACS or [employer]. Coronary artery disease. Minimal pericardial effusion. Bilateral renal cysts. Electronically Signed   By: Elmer Picker M.D.   On: 12/26/2021 13:58   DG Chest 2 View  Result Date: 12/26/2021 CLINICAL DATA:  Shortness of breath EXAM: CHEST - 2 VIEW COMPARISON:  Previous studies including the examination of 12/24/2021 FINDINGS: Transverse diameter of heart is slightly increased. There is poor inspiration. Increased markings are seen in the lower lung fields. There are no signs of alveolar pulmonary edema. There is no significant pleural effusion or pneumothorax. Degenerative changes are noted in both shoulders, more so on the right side. IMPRESSION: Increased markings in both lower lung fields may suggest crowding of markings due to poor inspiration or subsegmental atelectasis/pneumonia. Electronically Signed   By: Elmer Picker M.D.   On: 12/26/2021 11:45   VAS [place] LOWER EXTREMITY VENOUS (DVT)  Result Date: 12/25/2021  Lower Venous DVT Study Patient Name:  KAISEN ACKERS  Date of Exam:   12/18/2021 Medical Rec #: [id number]          Accession #:    [id number] Date of Birth: Nov 16, 1940          Patient Gender: M Patient Age:   83 years Exam Location:  [facility] Procedure:      VAS [place] LOWER EXTREMITY VENOUS (DVT) Referring Phys: Leotis Pain --------------------------------------------------------------------------------  Indications: Pain, Swelling, and Edema.  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  Performing Technologist: Delorise Shiner RVT  Examination Guidelines: A complete evaluation includes B-mode imaging, spectral Doppler, color Doppler, and power Doppler as needed of all accessible portions of each vessel. Bilateral testing is considered an integral part of a complete examination. Limited examinations for reoccurring indications may be performed as noted. The reflux portion of the exam is performed with the patient in reverse Trendelenburg.  +---------+---------------+---------+-----------+----------+--------------+ RIGHT    CompressibilityPhasicitySpontaneityPropertiesThrombus Aging +---------+---------------+---------+-----------+----------+--------------+ CFV      Full           Yes      Yes                                 +---------+---------------+---------+-----------+----------+--------------+ SFJ      Full                    Yes                                 +---------+---------------+---------+-----------+----------+--------------+ FV Prox  Full           Yes      Yes                                 +---------+---------------+---------+-----------+----------+--------------+  FV Mid   Full           Yes      Yes                                 +---------+---------------+---------+-----------+----------+--------------+ FV DistalFull           Yes      Yes                                 +---------+---------------+---------+-----------+----------+--------------+ PFV      Full                    Yes                                 +---------+---------------+---------+-----------+----------+--------------+ POP      Full           Yes      Yes                                 +---------+---------------+---------+-----------+----------+--------------+ PTV      Partial                                                     +---------+---------------+---------+-----------+----------+--------------+ GSV      Full                                dilated                  +---------+---------------+---------+-----------+----------+--------------+ SSV      Full                                                        +---------+---------------+---------+-----------+----------+--------------+   +---------+---------------+---------+-----------+----------+--------------+ LEFT     CompressibilityPhasicitySpontaneityPropertiesThrombus Aging +---------+---------------+---------+-----------+----------+--------------+ CFV      Full           Yes      Yes                                 +---------+---------------+---------+-----------+----------+--------------+ SFJ      Full                    Yes                                 +---------+---------------+---------+-----------+----------+--------------+ FV Prox  Full           Yes      Yes                                 +---------+---------------+---------+-----------+----------+--------------+ FV Mid   Full  Yes      Yes                                 +---------+---------------+---------+-----------+----------+--------------+ FV DistalFull           Yes      Yes                                 +---------+---------------+---------+-----------+----------+--------------+ PFV      Full                                                        +---------+---------------+---------+-----------+----------+--------------+ POP      Full           Yes      Yes                                 +---------+---------------+---------+-----------+----------+--------------+ PTV      Full                                                        +---------+---------------+---------+-----------+----------+--------------+ GSV      Full                                                        +---------+---------------+---------+-----------+----------+--------------+ SSV      Full                                                         +---------+---------------+---------+-----------+----------+--------------+     Summary: RIGHT: - Findings consistent with age indeterminate deep vein thrombosis involving the right posterior tibial veins. - There is no evidence of superficial venous thrombosis.  LEFT: - There is no evidence of deep vein thrombosis in the lower extremity. - There is no evidence of deep vein thrombosis proximal to the inguinal ligament or in the common femoral vein. - There is no evidence of superficial venous thrombosis.  - Previously documented Profunda DVT appears to have resolved.  *See table(s) above for measurements and observations. Electronically signed by Leotis Pain MD on 12/25/2021 at 8:52:22 AM.    Final    CT Angio Chest PE W and/or Wo Contrast  Result Date: 12/24/2021 CLINICAL DATA:  Acute onset of chest and abdominal pain. EXAM: CT ANGIOGRAPHY CHEST CT ABDOMEN AND PELVIS WITH CONTRAST TECHNIQUE: Multidetector CT imaging of the chest was performed using the standard protocol during bolus administration of intravenous contrast. Multiplanar CT image reconstructions and MIPs were obtained to evaluate the vascular anatomy. Multidetector CT imaging of the abdomen and pelvis was performed using the standard protocol during bolus administration of intravenous contrast. RADIATION DOSE REDUCTION: This  exam was performed according to the departmental dose-optimization program which includes automated exposure control, adjustment of the mA and/or kV according to patient size and/or use of iterative reconstruction technique. CONTRAST:  149m OMNIPAQUE IOHEXOL 350 MG/ML SOLN COMPARISON:  CT scan 09/14/2021 FINDINGS: CTA CHEST FINDINGS Cardiovascular: The heart is within normal limits in size for age and stable. No pericardial effusion. Stable tortuosity and calcification of the thoracic aorta but no aneurysm or dissection. The branch vessels are patent. Three-vessel coronary artery calcifications are noted. The pulmonary arteries are  fairly well opacified. No filling defects to suggest pulmonary embolism. Mediastinum/Nodes: No mediastinal or hilar mass or lymphadenopathy. The esophagus is grossly normal. Lungs/Pleura: Patchy bibasilar scarring changes and areas of subsegmental atelectasis. Very small bilateral pleural effusions. No pulmonary edema. No infiltrates. No worrisome pulmonary lesions or pulmonary nodules. Musculoskeletal: No chest wall mass, supraclavicular or axillary adenopathy. Mild stable symmetric gynecomastia. The bony thorax is intact. Review of the MIP images confirms the above findings. CT ABDOMEN and PELVIS FINDINGS Hepatobiliary: No hepatic lesions or intrahepatic biliary dilatation. Stable cortical defect involving the right hepatic lobe may be due to remote trauma or previous small infarct. The gallbladder is mildly distended but no definite gallstones or pericholecystic inflammatory changes. Pancreas: No mass, inflammation or ductal dilatation. Spleen: Normal size. No focal lesions. Adrenals/Urinary Tract: Adrenal glands are unremarkable and stable. Stable simple nonenhancing bilateral renal cysts not requiring any further imaging evaluation or follow-up. The bladder is unremarkable. Stomach/Bowel: The stomach is distended with fluid and air. No mass or obvious inflammatory process. The duodenum is unremarkable. No findings for small bowel obstruction. There is moderate distension of the right and transverse colon with stool and gas. Stable diverticulosis of the sigmoid colon without evidence of acute diverticulitis. The terminal ileum and appendix are normal. Vascular/Lymphatic: Advanced atherosclerotic calcification involving the aorta and branch vessels but no aneurysm or dissection. The major venous structures are patent. An IVC filter is noted. No mesenteric or retroperitoneal mass or adenopathy. Reproductive: The prostate gland and seminal vesicles are unremarkable. Probable UroLift markers in the prostate gland.  Other: No pelvic mass or adenopathy. No free pelvic fluid collections. No inguinal mass or adenopathy. No abdominal wall hernia or subcutaneous lesions. Musculoskeletal: Stable degenerative changes involving the spine, SI joints and right hip. Left hip prosthesis is intact. Review of the MIP images confirms the above findings. IMPRESSION: 1. No acute pulmonary embolism. 2. Bibasilar scarring changes and subsegmental atelectasis with very small bilateral pleural effusions. 3. No acute abdominal/pelvic findings. 4. Advanced atherosclerotic calcification involving the aorta and branch vessels including the coronary arteries. 5. Mild distention of the gallbladder but no gallstones or CT findings for acute cholecystitis. Aortic Atherosclerosis (ICD10-I70.0). Electronically Signed   By: PMarijo SanesM.D.   On: 12/24/2021 07:41   CT ABDOMEN PELVIS W CONTRAST  Result Date: 12/24/2021 CLINICAL DATA:  Acute onset of chest and abdominal pain. EXAM: CT ANGIOGRAPHY CHEST CT ABDOMEN AND PELVIS WITH CONTRAST TECHNIQUE: Multidetector CT imaging of the chest was performed using the standard protocol during bolus administration of intravenous contrast. Multiplanar CT image reconstructions and MIPs were obtained to evaluate the vascular anatomy. Multidetector CT imaging of the abdomen and pelvis was performed using the standard protocol during bolus administration of intravenous contrast. RADIATION DOSE REDUCTION: This exam was performed according to the departmental dose-optimization program which includes automated exposure control, adjustment of the mA and/or kV according to patient size and/or use of iterative reconstruction technique. CONTRAST:  1025mOMNIPAQUE IOHEXOL  350 MG/ML SOLN COMPARISON:  CT scan 09/14/2021 FINDINGS: CTA CHEST FINDINGS Cardiovascular: The heart is within normal limits in size for age and stable. No pericardial effusion. Stable tortuosity and calcification of the thoracic aorta but no aneurysm or  dissection. The branch vessels are patent. Three-vessel coronary artery calcifications are noted. The pulmonary arteries are fairly well opacified. No filling defects to suggest pulmonary embolism. Mediastinum/Nodes: No mediastinal or hilar mass or lymphadenopathy. The esophagus is grossly normal. Lungs/Pleura: Patchy bibasilar scarring changes and areas of subsegmental atelectasis. Very small bilateral pleural effusions. No pulmonary edema. No infiltrates. No worrisome pulmonary lesions or pulmonary nodules. Musculoskeletal: No chest wall mass, supraclavicular or axillary adenopathy. Mild stable symmetric gynecomastia. The bony thorax is intact. Review of the MIP images confirms the above findings. CT ABDOMEN and PELVIS FINDINGS Hepatobiliary: No hepatic lesions or intrahepatic biliary dilatation. Stable cortical defect involving the right hepatic lobe may be due to remote trauma or previous small infarct. The gallbladder is mildly distended but no definite gallstones or pericholecystic inflammatory changes. Pancreas: No mass, inflammation or ductal dilatation. Spleen: Normal size. No focal lesions. Adrenals/Urinary Tract: Adrenal glands are unremarkable and stable. Stable simple nonenhancing bilateral renal cysts not requiring any further imaging evaluation or follow-up. The bladder is unremarkable. Stomach/Bowel: The stomach is distended with fluid and air. No mass or obvious inflammatory process. The duodenum is unremarkable. No findings for small bowel obstruction. There is moderate distension of the right and transverse colon with stool and gas. Stable diverticulosis of the sigmoid colon without evidence of acute diverticulitis. The terminal ileum and appendix are normal. Vascular/Lymphatic: Advanced atherosclerotic calcification involving the aorta and branch vessels but no aneurysm or dissection. The major venous structures are patent. An IVC filter is noted. No mesenteric or retroperitoneal mass or  adenopathy. Reproductive: The prostate gland and seminal vesicles are unremarkable. Probable UroLift markers in the prostate gland. Other: No pelvic mass or adenopathy. No free pelvic fluid collections. No inguinal mass or adenopathy. No abdominal wall hernia or subcutaneous lesions. Musculoskeletal: Stable degenerative changes involving the spine, SI joints and right hip. Left hip prosthesis is intact. Review of the MIP images confirms the above findings. IMPRESSION: 1. No acute pulmonary embolism. 2. Bibasilar scarring changes and subsegmental atelectasis with very small bilateral pleural effusions. 3. No acute abdominal/pelvic findings. 4. Advanced atherosclerotic calcification involving the aorta and branch vessels including the coronary arteries. 5. Mild distention of the gallbladder but no gallstones or CT findings for acute cholecystitis. Aortic Atherosclerosis (ICD10-I70.0). Electronically Signed   By: Marijo Sanes M.D.   On: 12/24/2021 07:41   DG Chest 2 View  Result Date: 12/24/2021 CLINICAL DATA:  Chest pain and epigastric pain and nausea. EXAM: CHEST - 2 VIEW COMPARISON:  Portable chest 12/21/2021 FINDINGS: The lungs expiratory but generally clear apart from linear atelectasis in the bases. No pleural effusion is seen. There is mild cardiomegaly without evidence of CHF. There is aortic atherosclerosis with stable mediastinum. There is spinal bridging enthesopathy with no acute skeletal findings. IMPRESSION: No acute radiographic chest findings. Expiratory exam with hypoventilatory change and limited view of the bases. Mild cardiomegaly. Electronically Signed   By: Telford Nab M.D.   On: 12/24/2021 03:43   DG Chest Port 1 View  Result Date: 12/21/2021 CLINICAL DATA:  Chest pain EXAM: PORTABLE CHEST 1 VIEW COMPARISON:  12/03/2021 FINDINGS: Lungs volumes are small, but are symmetric and are clear. No pneumothorax or pleural effusion. Cardiac size within normal limits. Pulmonary vascularity is  normal.  Osseous structures are age-appropriate. No acute bone abnormality. IMPRESSION: Pulmonary hypoinflation. Electronically Signed   By: Fidela Salisbury M.D.   On: 12/21/2021 03:50   CT Angio Chest PE W and/or Wo Contrast  Result Date: 12/03/2021 CLINICAL DATA:  Chest pain and altered mental status. History of DVT EXAM: CT ANGIOGRAPHY CHEST WITH CONTRAST TECHNIQUE: Multidetector CT imaging of the chest was performed using the standard protocol during bolus administration of intravenous contrast. Multiplanar CT image reconstructions and MIPs were obtained to evaluate the vascular anatomy. RADIATION DOSE REDUCTION: This exam was performed according to the departmental dose-optimization program which includes automated exposure control, adjustment of the mA and/or kV according to patient size and/or use of iterative reconstruction technique. CONTRAST:  37m OMNIPAQUE IOHEXOL 350 MG/ML SOLN COMPARISON:  CT chest 09/15/2018 FINDINGS: Cardiovascular: There is adequate opacification of the pulmonary arteries to the segmental level. There is web-like nonocclusive clot at the bifurcation of the right upper lobe pulmonary artery ([id number]). No other pulmonary emboli are identified. There is no convincing evidence of right heart strain. The main pulmonary artery is not dilated. The heart size is mildly enlarged. There is no pericardial effusion. There is coronary artery disease and calcified atherosclerotic plaque of the thoracic aorta. Mediastinum/Nodes: The thyroid is unremarkable. The esophagus is grossly unremarkable. There is no mediastinal, hilar, or axillary lymphadenopathy. Lungs/Pleura: The trachea and central airways are patent. There is prominent bilateral bronchial wall thickening. There are trace bilateral pleural effusions with adjacent opacities likely reflecting atelectasis. There is no focal consolidation. There is no pulmonary edema. There is no pneumothorax There are no suspicious nodules. Upper Abdomen:  Bilateral renal cysts are noted. The imaged portions of the upper abdominal viscera are otherwise unremarkable. Musculoskeletal: There is no acute osseous abnormality or suspicious osseous lesion. Review of the MIP images confirms the above findings. IMPRESSION: 1. Small burden of web-like nonocclusive clot at the right upper lobe pulmonary artery bifurcation without evidence of right heart strain. 2. Trace bilateral pleural effusions and central bronchial wall thickening which may reflect bronchitis. Aortic Atherosclerosis (ICD10-I70.0). These results were called by telephone at the time of interpretation on 12/03/2021 at 11:44 am to provider Dr. WJacelyn Grip who verbally acknowledged these results. Electronically Signed   By: PValetta MoleM.D.   On: 12/03/2021 11:44   CT HEAD WO CONTRAST (5MM)  Result Date: 12/03/2021 CLINICAL DATA:  Mental status changes. EXAM: CT HEAD WITHOUT CONTRAST TECHNIQUE: Contiguous axial images were obtained from the base of the skull through the vertex without intravenous contrast. RADIATION DOSE REDUCTION: This exam was performed according to the departmental dose-optimization program which includes automated exposure control, adjustment of the mA and/or kV according to patient size and/or use of iterative reconstruction technique. COMPARISON:  11/26/2021. FINDINGS: Brain: There is no evidence for acute hemorrhage, hydrocephalus, mass lesion, or abnormal extra-axial fluid collection. No definite CT evidence for acute infarction. 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 Diffuse loss of parenchymal volume is consistent with atrophy. Patchy low attenuation in the deep hemispheric and periventricular white matter is nonspecific, but likely reflects chronic microvascular ischemic demyelination. Vascular: No hyperdense vessel or unexpected calcification. Skull: No evidence for fracture. No worrisome lytic or sclerotic lesion. Sinuses/Orbits: The  visualized paranasal sinuses and mastoid air cells are clear. Visualized portions of the globes and intraorbital fat are unremarkable. Other: None. IMPRESSION: 1. No acute intracranial abnormality. 2. 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 3. Atrophy with chronic  small vessel ischemic disease. Electronically Signed   By: Misty Stanley M.D.   On: 12/03/2021 06:07   DG Chest 2 View  Result Date: 12/03/2021 CLINICAL DATA:  Chest pain. EXAM: CHEST - 2 VIEW COMPARISON:  11/24/2021 FINDINGS: 0514 hours. Low volume film. Cardiopericardial silhouette is at upper limits of normal for size. Basilar atelectasis evident. No edema or focal airspace consolidation. No substantial pleural effusion. Telemetry leads overlie the chest. IMPRESSION: Low volume film with basilar atelectasis. Electronically Signed   By: Misty Stanley M.D.   On: 12/03/2021 06:04   DG Hip Unilat W or Wo Pelvis 2-3 Views Right  Result Date: 12/03/2021 CLINICAL DATA:  Right leg pain. EXAM: DG HIP (WITH OR WITHOUT PELVIS) 2-3V RIGHT COMPARISON:  None Available. FINDINGS: No evidence for acute fracture. SI joints and symphysis pubis unremarkable. Patient is status post left total hip replacement, incompletely visualize femoral component. AP and frog-leg lateral views of the right hip show no evidence for femoral neck fracture. Degenerative spurring noted in the acetabulum and femoral head. IMPRESSION: Degenerative changes in the right hip without acute bony findings. Electronically Signed   By: Misty Stanley M.D.   On: 12/03/2021 06:03    Microbiology: Results for orders placed or performed during the hospital encounter of 12/26/21  Expectorated Sputum Assessment w Gram Stain, Rflx to Resp Cult     Status: None   Collection Time: 12/26/21 11:19 AM   Specimen: Expectorated Sputum  Result Value Ref Range Status   Specimen Description EXPECTORATED SPUTUM  Final   Special Requests NONE  Final    Sputum evaluation   Final    THIS SPECIMEN IS ACCEPTABLE FOR SPUTUM CULTURE Performed at [facility], [address], [place], [place] [address]    Report Status 12/26/2021 FINAL  Final  Culture, Respiratory w Gram Stain     Status: None (Preliminary result)   Collection Time: 12/26/21 11:19 AM  Result Value Ref Range Status   Specimen Description   Final    EXPECTORATED SPUTUM Performed at [facility], [address], [place], [place] [address]    Special Requests   Final    NONE Reflexed from [id number] Performed at [facility], [address] [place], [place] [address]    Gram Stain   Final    NO WBC SEEN ABUNDANT GRAM NEGATIVE RODS ABUNDANT GRAM POSITIVE COCCI IN PAIRS IN CHAINS IN CLUSTERS MODERATE GRAM NEGATIVE COCCOBACILLI MODERATE SQUAMOUS EPITHELIAL CELLS PRESENT Performed at [facility], [place] [address], [place], [place] [address]    Culture PENDING  Incomplete   Report Status PENDING  Incomplete  Blood culture (routine x 2)     Status: None (Preliminary result)   Collection Time: 12/26/21 11:22 AM   Specimen: BLOOD  Result Value Ref Range Status   Specimen Description BLOOD RIGHT ANTECUBITAL  Final   Special Requests   Final    BOTTLES DRAWN AEROBIC AND ANAEROBIC Blood Culture adequate volume   Culture   Final    NO GROWTH 2 DAYS Performed at [facility], [address] [place],  [address]    Report Status PENDING  Incomplete  Blood culture (routine x 2)     Status: None (Preliminary result)   Collection Time: 12/26/21 11:27 AM   Specimen: BLOOD  Result Value Ref Range Status   Specimen Description BLOOD BLOOD RIGHT FOREARM  Final   Special Requests   Final    BOTTLES DRAWN AEROBIC AND ANAEROBIC  Blood Culture results may not be optimal due to an excessive volume of blood received in culture bottles   Culture   Final    NO GROWTH 2 DAYS Performed at [facility], [address] [place],  [place] [address]    Report Status PENDING  Incomplete  SARS Coronavirus 2 by RT PCR (hospital order, performed in [facility] hospital lab) *cepheid single result test* Anterior Nasal Swab     Status: None   Collection Time: 12/26/21  8:15 PM   Specimen: Anterior Nasal Swab  Result Value Ref Range Status   SARS Coronavirus 2 by RT PCR NEGATIVE NEGATIVE Final    Comment: (NOTE) SARS-CoV-2 target nucleic acids are NOT DETECTED.  The SARS-CoV-2 RNA is generally detectable in upper and lower respiratory specimens during the acute phase of infection. The lowest concentration of SARS-CoV-2 viral copies this assay can detect is 250 copies / mL. A negative result does not preclude SARS-CoV-2 infection and should not be used as the sole basis for treatment or other patient management decisions.  A negative result may occur with improper specimen collection / handling, submission of specimen other than nasopharyngeal swab, presence of viral mutation(s) within the areas targeted by this assay, and inadequate number of viral copies (<250 copies / mL). A negative result must be combined with clinical observations, patient history, and epidemiological information.  Fact Sheet for Patients:   https://www..info/  Fact Sheet for Healthcare Providers: https://hall.com/  This test is not yet approved or  cleared by the [place] FDA and has been authorized for detection and/or diagnosis of SARS-CoV-2 by FDA under an Emergency Use Authorization (EUA).  This EUA will remain in effect (meaning this test can be used) for the duration of the COVID-19 declaration under Section 564(b)(1) of the Act, 21 U.S.C. section 360bbb-3(b)(1), unless the authorization is terminated or revoked sooner.  Performed at [facility], [address] [place], [place] [address]   Surgical pcr screen     Status: None   Collection Time: 12/26/21  8:20 PM   Specimen:  Nasal Mucosa; Nasal Swab  Result Value Ref Range Status   MRSA, PCR NEGATIVE NEGATIVE Final   Staphylococcus aureus NEGATIVE NEGATIVE Final    Comment: (NOTE) The Xpert SA Assay (FDA approved for NASAL specimens in patients 80 years of age and older), is one component of a comprehensive surveillance program. It is not intended to diagnose infection nor to guide or monitor treatment. Performed at [facility], [address] [place], [place] [address]     Labs: CBC: Recent Labs  Lab 12/24/21 0306 12/26/21 1122 12/27/21 0432  WBC 10.2 31.2* 25.3*  HGB 11.0* 9.1* 7.9*  HCT 36.1* 30.1* 25.3*  MCV 90.3 89.9 88.5  PLT 275 313 703   Basic Metabolic Panel: Recent Labs  Lab 12/24/21 0306 12/26/21 1122 12/27/21 0432  NA 139 137 137  K 3.8 5.0 4.7  CL 104 103 107  CO2 23 20* 21*  GLUCOSE 153* 142* 122*  BUN 25* 62* 73*  CREATININE 1.51* 2.66* 3.39*  CALCIUM 9.1 8.9 7.9*  MG  --   --  2.0  PHOS  --   --  4.9*   Liver Function Tests: Recent Labs  Lab 12/24/21 0306 12/26/21 1120 12/27/21 0432  AST 28 44* 85*  ALT 14 21 45*  ALKPHOS 59 59 64  BILITOT 0.8 1.0 0.4  PROT 7.7 7.3 6.5  ALBUMIN 3.4* 2.9* 2.5*   CBG: Recent  Labs  Lab 12/26/21 2022 12/27/21 0742 12/27/21 0858  GLUCAP 151* 119* 126*    Discharge time spent: greater than 30 minutes.  Signed: Fritzi Mandes, MD [facility] 12/28/2021

## 2021-12-28 NOTE — Progress Notes (Addendum)
Manufacturing engineer [facility]) Hospital Liaison Note  Received request from MD/Patel for family interest in [facility]. Visited patient at bedside and spoke with spouse/Angelene to confirm interest and explain services.  Approval for Hospice Home is determined by [facility] MD. Once [facility] MD has determined Hospice Home eligibility, [place] will update hospital staff and family. Approved.  Please do not hesitate to call with any hospice related questions.    Thank you for the opportunity to participate in this patient's care.  Addendum: 2:58 p.m. Consent forms have been completed.  EMS notified of patient D/C and transport arranged for 5:00 p.m.. TOC/Deliliah and Attending Physician/Dr. Posey Pronto also notified of transport arrangement.    Please send signed DNR form with patient and RN call report to [phone].    Daphene Calamity, MSW [facility] Liaison [phone]

## 2021-12-28 NOTE — Plan of Care (Signed)
  Problem: Education: Goal: Knowledge of General Education information will improve Description: Including pain rating scale, medication(s)/side effects and non-pharmacologic comfort measures Outcome: Progressing   Problem: Health Behavior/Discharge Planning: Goal: Ability to manage health-related needs will improve Outcome: Progressing   Problem: Clinical Measurements: Goal: Ability to maintain clinical measurements within normal limits will improve Outcome: Progressing Goal: Will remain free from infection Outcome: Progressing Goal: Diagnostic test results will improve Outcome: Progressing Goal: Respiratory complications will improve Outcome: Progressing Goal: Cardiovascular complication will be avoided Outcome: Progressing   Problem: Activity: Goal: Risk for activity intolerance will decrease Outcome: Progressing   Problem: Nutrition: Goal: Adequate nutrition will be maintained Outcome: Progressing   Problem: Coping: Goal: Level of anxiety will decrease Outcome: Progressing   Problem: Elimination: Goal: Will not experience complications related to bowel motility Outcome: Progressing Goal: Will not experience complications related to urinary retention Outcome: Progressing   Problem: Pain Managment: Goal: General experience of comfort will improve Outcome: Progressing   Problem: Safety: Goal: Ability to remain free from injury will improve Outcome: Progressing   Problem: Skin Integrity: Goal: Risk for impaired skin integrity will decrease Outcome: Progressing   Problem: Activity: Goal: Ability to tolerate increased activity will improve Outcome: Progressing   Problem: Clinical Measurements: Goal: Ability to maintain a body temperature in the normal range will improve Outcome: Progressing   Problem: Respiratory: Goal: Ability to maintain adequate ventilation will improve Outcome: Progressing Goal: Ability to maintain a clear airway will improve Outcome:  Progressing   Problem: Education: Goal: Knowledge of the prescribed therapeutic regimen will improve Outcome: Progressing   Problem: Coping: Goal: Ability to identify and develop effective coping behavior will improve Outcome: Progressing   Problem: Clinical Measurements: Goal: Quality of life will improve Outcome: Progressing   Problem: Respiratory: Goal: Verbalizations of increased ease of respirations will increase Outcome: Progressing   Problem: Role Relationship: Goal: Family's ability to cope with current situation will improve Outcome: Progressing Goal: Ability to verbalize concerns, feelings, and thoughts to partner or family member will improve Outcome: Progressing   Problem: Pain Management: Goal: Satisfaction with pain management regimen will improve Outcome: Progressing   Problem: Education: Goal: Ability to demonstrate management of disease process will improve Outcome: Progressing Goal: Ability to verbalize understanding of medication therapies will improve Outcome: Progressing Goal: Individualized Educational Video(s) Outcome: Progressing   Problem: Activity: Goal: Capacity to carry out activities will improve Outcome: Progressing   Problem: Cardiac: Goal: Ability to achieve and maintain adequate cardiopulmonary perfusion will improve Outcome: Progressing

## 2021-12-29 ENCOUNTER — Telehealth: Payer: Self-pay

## 2021-12-29 LAB — LEGIONELLA PNEUMOPHILA SEROGP 1 UR AG: L. pneumophila Serogp 1 Ur Ag: NEGATIVE

## 2021-12-29 LAB — BLOOD CULTURE ID PANEL (REFLEXED) - BCID2

## 2021-12-29 LAB — CULTURE, RESPIRATORY W GRAM STAIN
Culture: NORMAL
Gram Stain: NONE SEEN

## 2021-12-29 NOTE — Telephone Encounter (Unsigned)
Received call from lab with + blood culture  result for pt. Pt was dc to hospice and is now comfort care. MD Paduchowski reviewed chart and no antibiotics being given due to poor prognosis.

## 2021-12-30 ENCOUNTER — Encounter: Payer: Self-pay | Admitting: Neurology

## 2021-12-30 ENCOUNTER — Inpatient Hospital Stay: Payer: Self-pay | Admitting: Neurology

## 2021-12-30 LAB — BLOOD GAS, ARTERIAL
Acid-base deficit: 5.1 mmol/L — ABNORMAL HIGH (ref 0.0–2.0)
Bicarbonate: 18.8 mmol/L — ABNORMAL LOW (ref 20.0–28.0)
FIO2: 55 %
O2 Content: 15 L/min
O2 Saturation: 92.8 %
Patient temperature: 37
pCO2 arterial: 31 mmHg — ABNORMAL LOW (ref 32–48)
pH, Arterial: 7.39 (ref 7.35–7.45)
pO2, Arterial: 63 mmHg — ABNORMAL LOW (ref 83–108)

## 2021-12-31 LAB — CULTURE, BLOOD (ROUTINE X 2)
Culture: NO GROWTH
Special Requests: ADEQUATE

## 2022-01-08 DIAGNOSIS — I10 Essential (primary) hypertension: Secondary | ICD-10-CM | POA: Diagnosis not present

## 2022-01-08 DIAGNOSIS — R2681 Unsteadiness on feet: Secondary | ICD-10-CM | POA: Diagnosis not present

## 2022-01-17 DEATH — deceased

## 2022-02-02 ENCOUNTER — Ambulatory Visit: Payer: No Typology Code available for payment source | Admitting: Cardiovascular Disease

## 2023-06-16 IMAGING — MR MR CERVICAL SPINE W/O CM
5 series · 38 of 48 positions shown · non-contrast
Comparison: None Available.

CLINICAL DATA: Ataxia, nontraumatic, cervical pathology suspected

EXAM:
MRI CERVICAL SPINE WITHOUT CONTRAST
TECHNIQUE: Multiplanar, multisequence MR imaging of the cervical spine was
performed. No intravenous contrast was administered.

[Series 26: T2 · sagittal · 3.0mm · 0.62mm/px · 6 of 15 slices shown (1 of 2)]
[im 1/15]
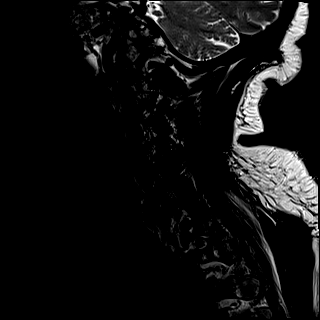
[im 3/15]
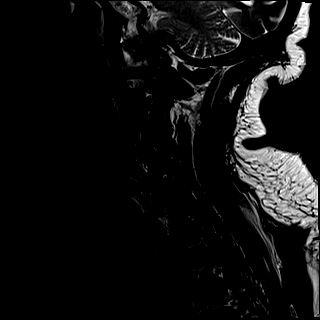
[im 6/15]
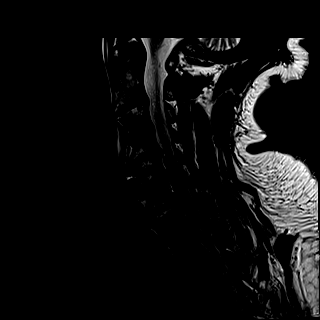
[im 9/15]
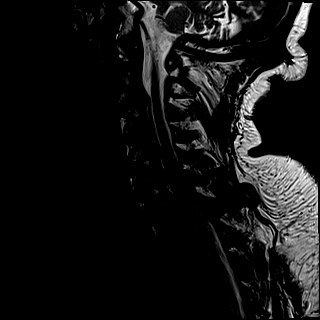
[im 12/15]
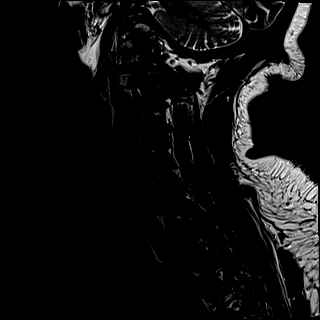
[im 15/15]
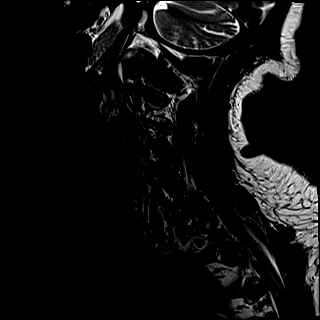

[Series 27: FLAIR · sagittal · 3.0mm · 0.78mm/px · 7 of 15 slices shown]
[im 1/15]
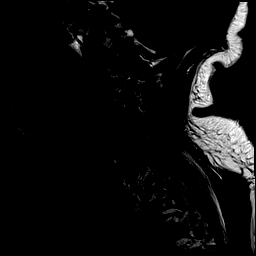
[im 3/15]
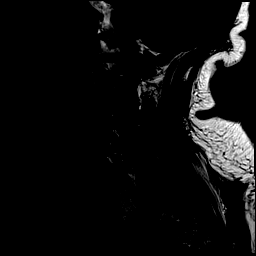
[im 5/15]
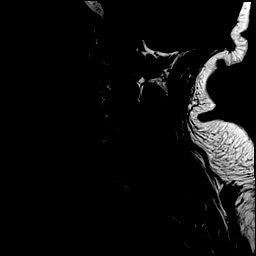
[im 8/15]
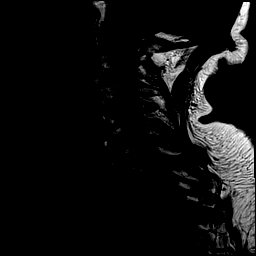
[im 10/15]
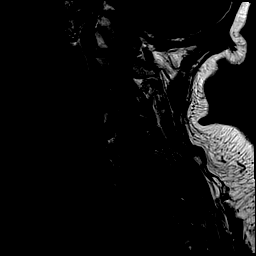
[im 12/15]
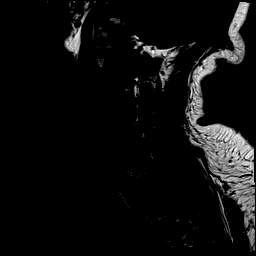
[im 15/15]
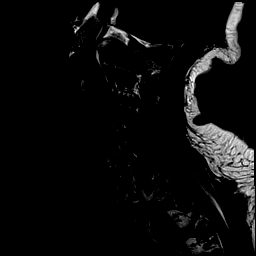

[Series 28: STIR · sagittal · 3.0mm · 0.62mm/px · 7 of 15 slices shown]
[im 1/15]
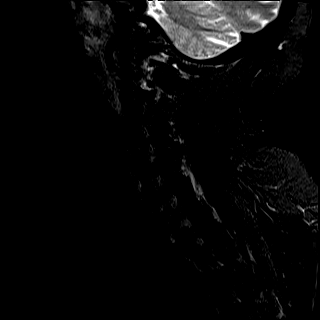
[im 3/15]
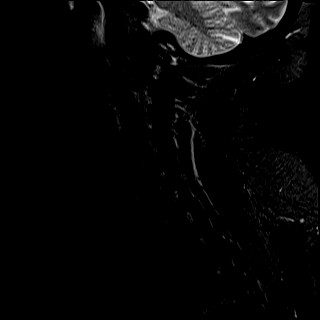
[im 5/15]
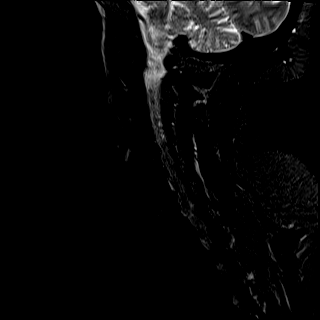
[im 8/15]
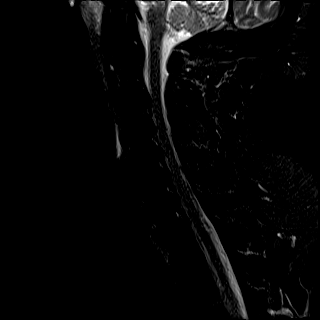
[im 10/15]
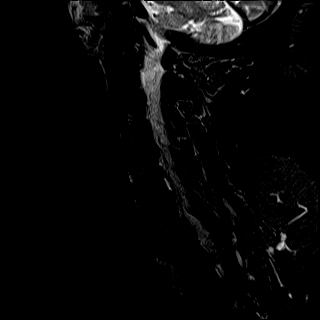
[im 12/15]
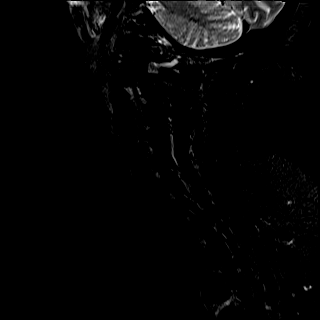
[im 15/15]
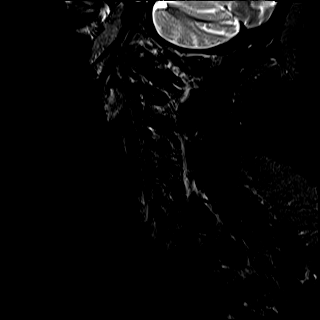

[Series 29: T2 · axial · 3.0mm · 0.70mm/px · z∈[-247,-153]mm · 10 of 29 slices shown (2 of 2)]
[im 1/29]
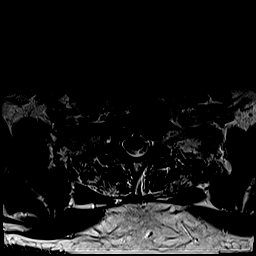
[im 3/29]
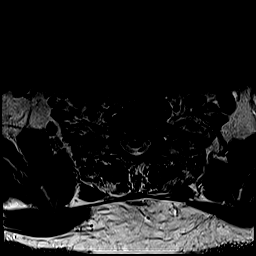
[im 5/29]
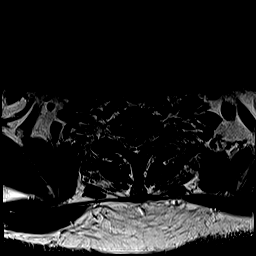
[im 7/29]
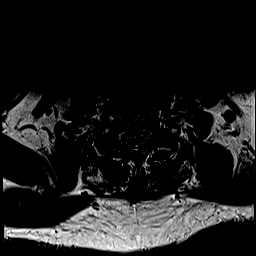
[im 9/29]
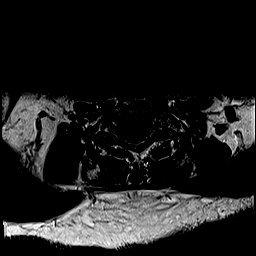
[im 13/29]
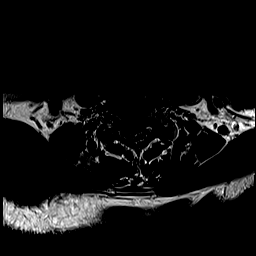
[im 16/29]
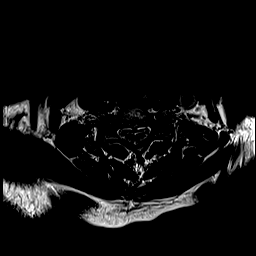
[im 20/29]
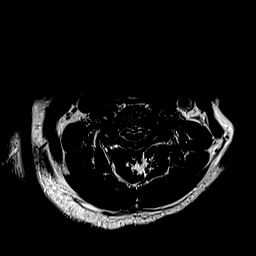
[im 24/29]
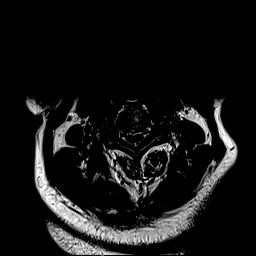
[im 29/29]
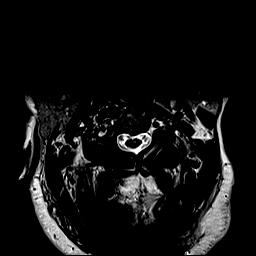

[Series 30: ax mpgr · axial · 3.0mm · 0.35mm/px · z∈[-247,-153]mm · 8 of 29 slices shown]
[im 1/29]
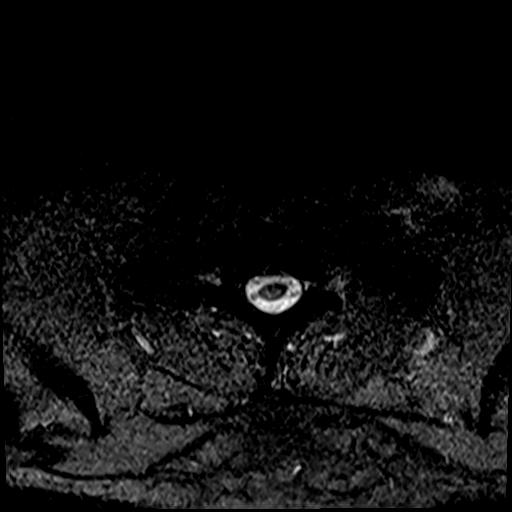
[im 5/29]
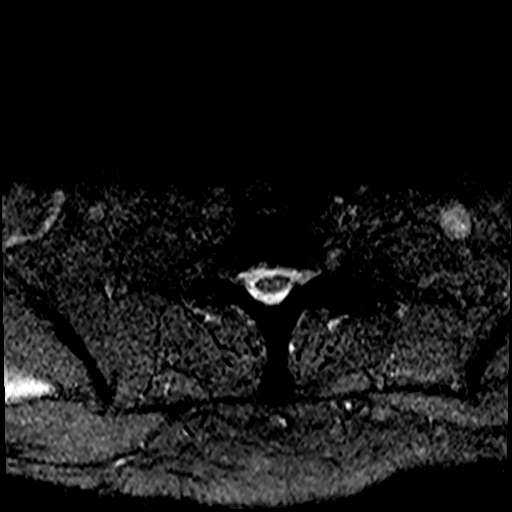
[im 9/29]
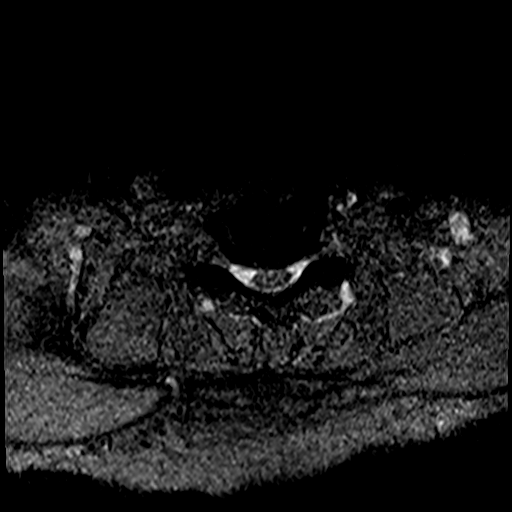
[im 13/29]
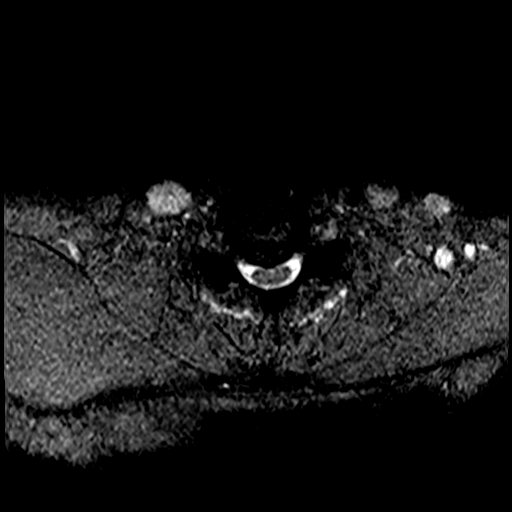
[im 16/29]
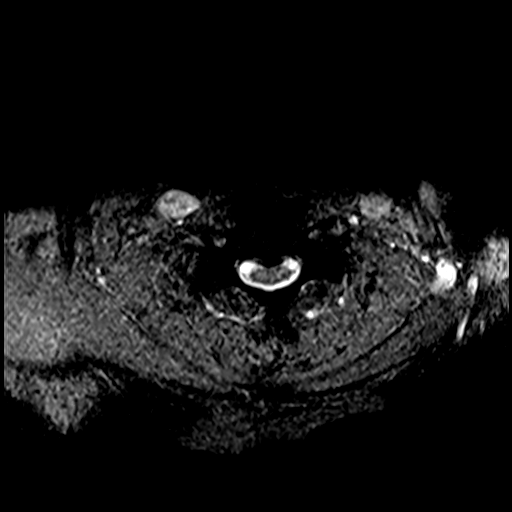
[im 20/29]
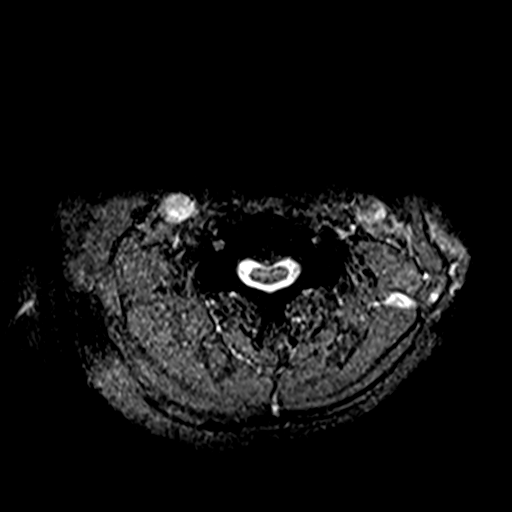
[im 24/29]
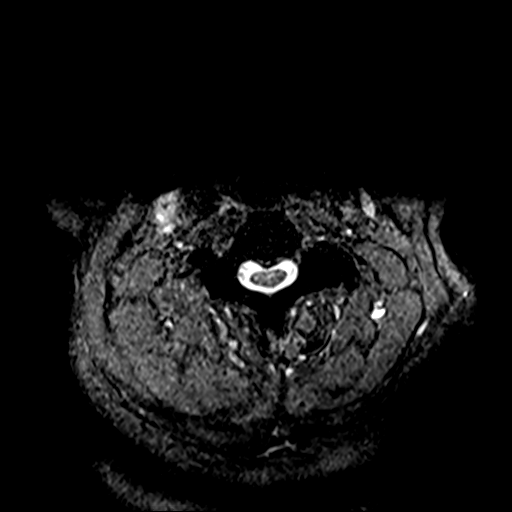
[im 29/29]
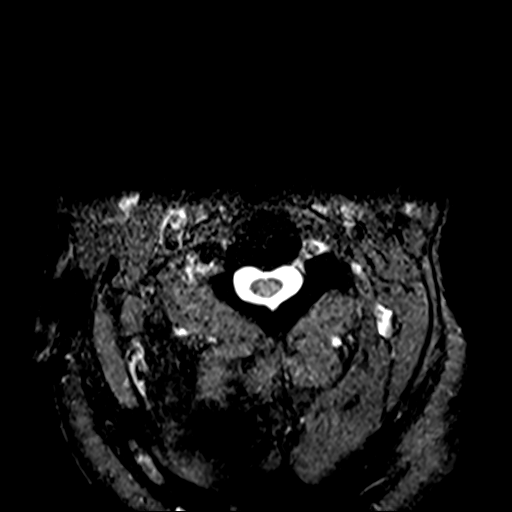

[38 of 48 positions shown; findings below may reference images not displayed]

FINDINGS: Alignment: Straightening of the cervical lordosis with grade 1
anterolisthesis of C7 on T1.

Vertebrae: No fracture, evidence of discitis, or bone lesion.
Multilevel discogenic endplate marrow changes and anterior endplate
osteophytosis.

Cord: Normal signal and morphology.

Posterior Fossa, vertebral arteries, paraspinal tissues: Negative.

Disc levels:

C2-C3: Disc osteophyte complex with central protrusion. Left greater
than right facet arthropathy. Mild canal stenosis. No significant
foraminal stenosis.

C3-C4: Disc osteophyte complex with central protrusion. Bilateral
facet arthropathy and uncovertebral spurring. Mild canal stenosis
with mild bilateral foraminal stenosis.

C4-C5: Disc osteophyte complex with mild bilateral facet and
uncovertebral spurring. Moderate canal stenosis with moderate
bilateral foraminal stenosis.

C5-C6: Disc osteophyte complex with right paracentral protrusion.
Bilateral facet and uncovertebral arthropathy. Moderate-severe canal
stenosis with severe right and moderate left foraminal stenosis.

C6-C7: Disc osteophyte complex, eccentric to the right. Bilateral
facet and uncovertebral arthropathy. Moderate canal stenosis with
mild-to-moderate bilateral foraminal stenosis.

C7-T1: Disc uncovering without focal protrusion. Mild bilateral
facet arthropathy. No foraminal or canal stenosis.
IMPRESSION: 1. Advanced multilevel cervical spondylosis with moderate-severe
canal stenosis at C5-C6 and moderate canal stenosis at C4-5 and
C6-7.
2. Multilevel bilateral foraminal stenosis, severe on the right at
C5-6.

## 2023-06-16 IMAGING — CT CT CHEST-ABD-PELV W/ CM
2 of 5 series · 14 of 36 positions shown, 16 images · IV contrast (agent unspecified)
Comparison: Chest radiographs obtained earlier today. Renal
ultrasound dated 12/06/2013. Abdomen and pelvis CT dated 04/15/2020.

CLINICAL DATA: Weakness, abdominal pain and shoulder pain. Clinical
concern for lung infiltrate, diverticulitis, small bowel obstruction
or mass.

EXAM:
CT CHEST, ABDOMEN, AND PELVIS WITH CONTRAST
TECHNIQUE: Multidetector CT imaging of the chest, abdomen and pelvis was
performed following the standard protocol during bolus
administration of intravenous contrast.

[Series 2: cap with (person_name) · axial · 0.87mm/px · z∈[-977,-447]mm · 11 of 128 slices shown, 13 images]
[im 11/128  mediastinal]
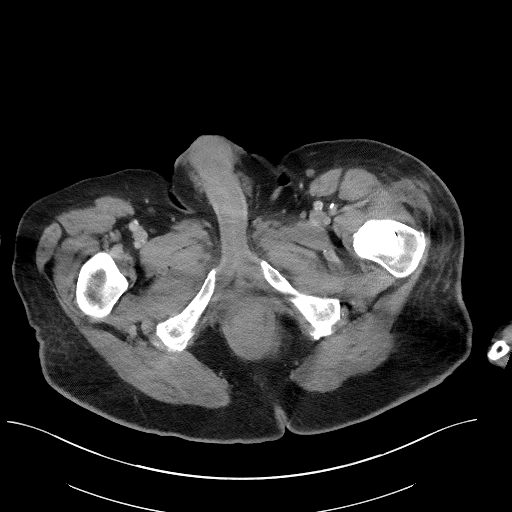
[im 11/128  bone]
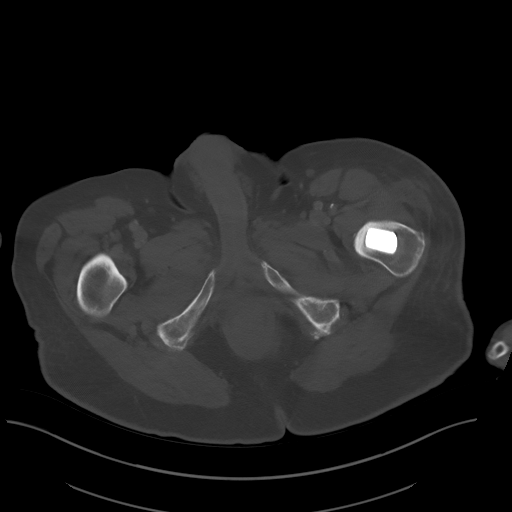
[im 22/128  mediastinal]
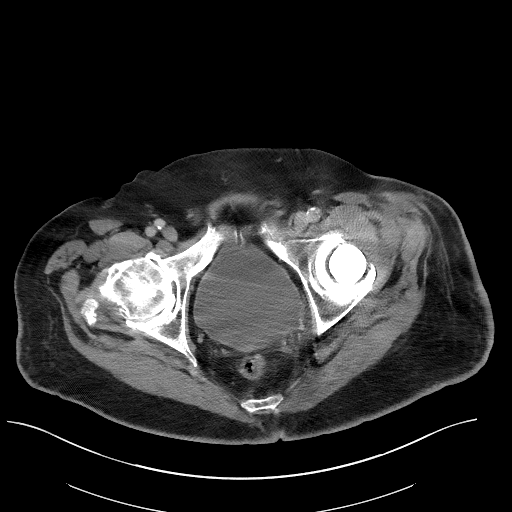
[im 32/128  mediastinal]
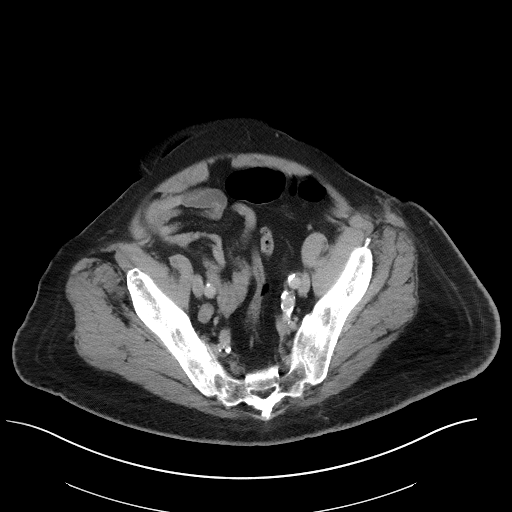
[im 43/128  mediastinal]
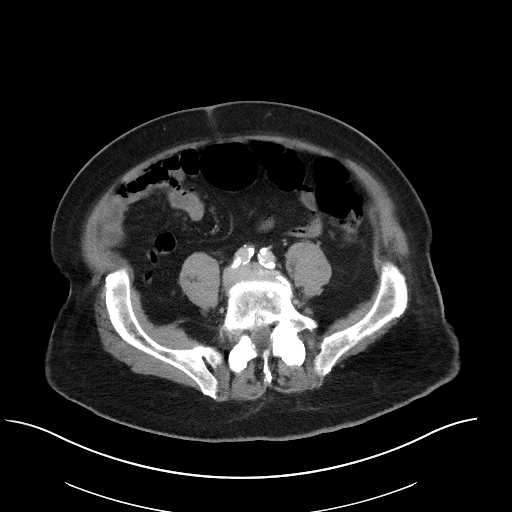
[im 53/128  mediastinal]
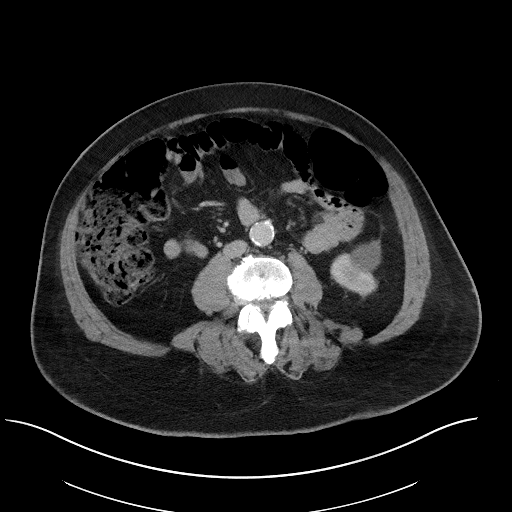
[im 64/128  mediastinal]
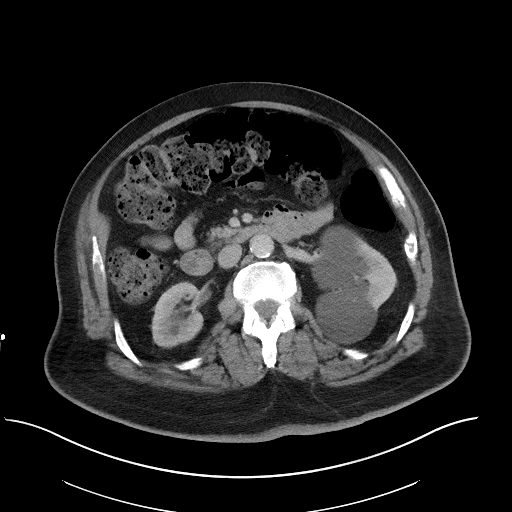
[im 75/128  mediastinal]
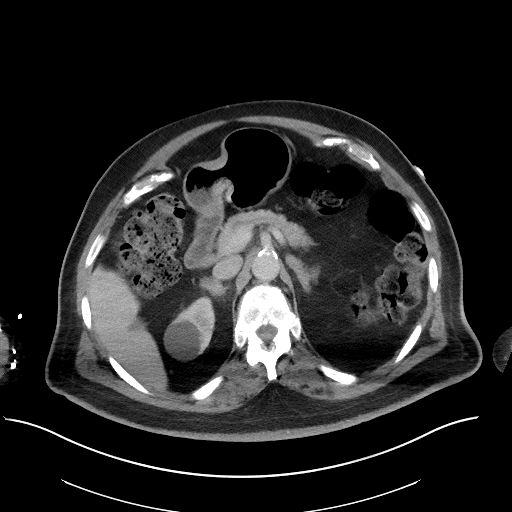
[im 85/128  mediastinal]
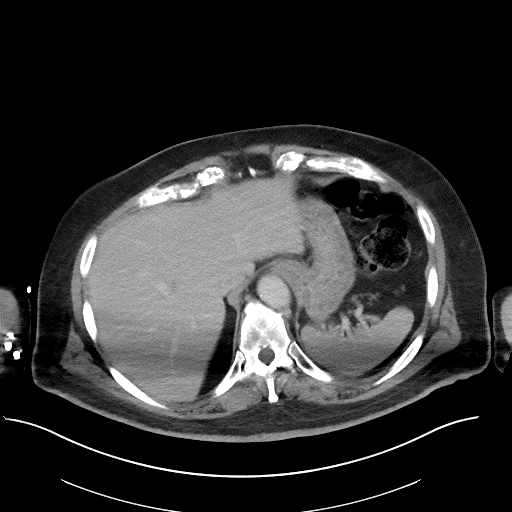
[im 96/128  mediastinal]
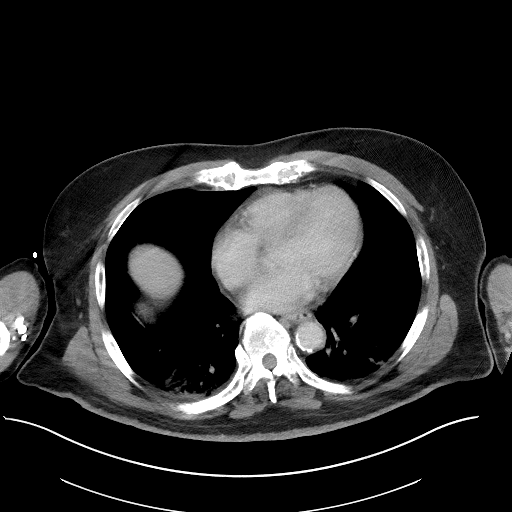
[im 96/128  bone]
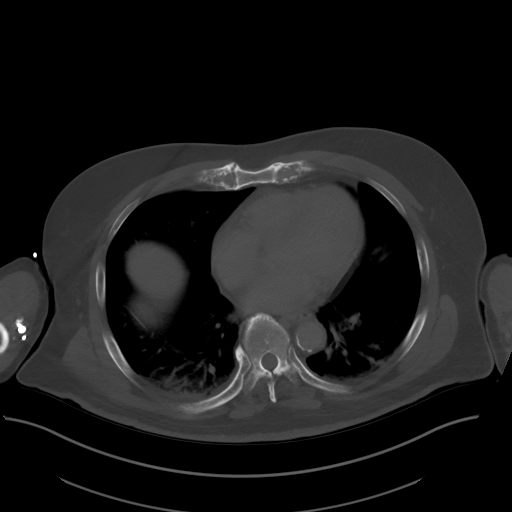
[im 106/128  mediastinal]
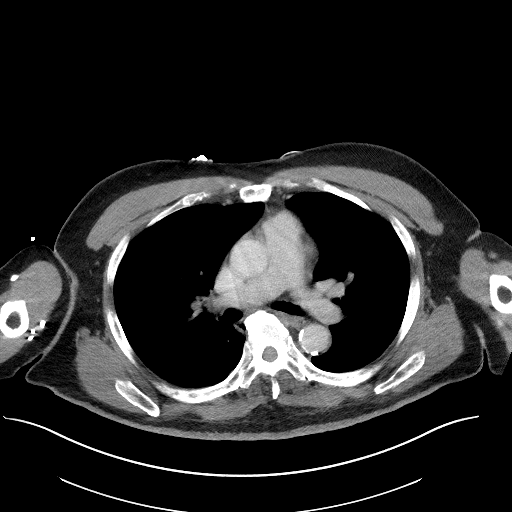
[im 117/128  mediastinal]
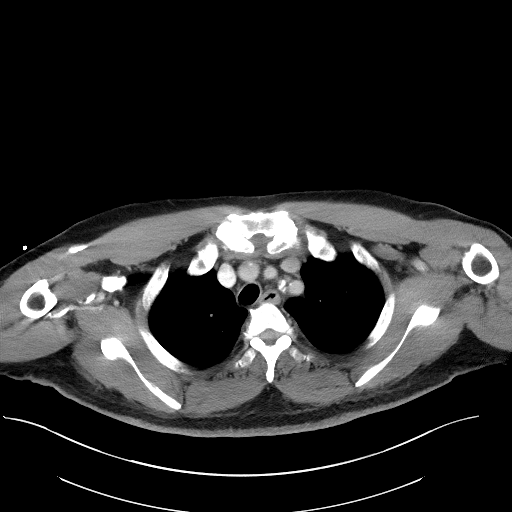

[Series 5: coronals (person_name) · coronal · 0.81mm/px · 3 of 160 slices shown]
[im 32/160  mediastinal]
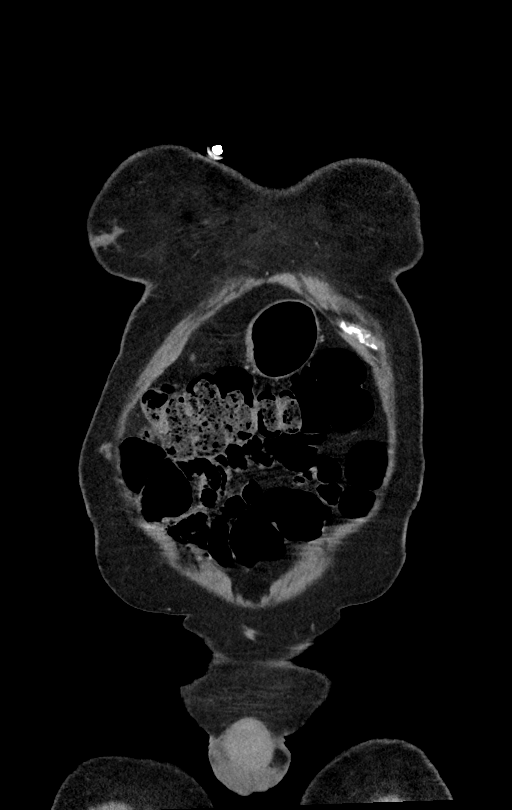
[im 64/160  mediastinal]
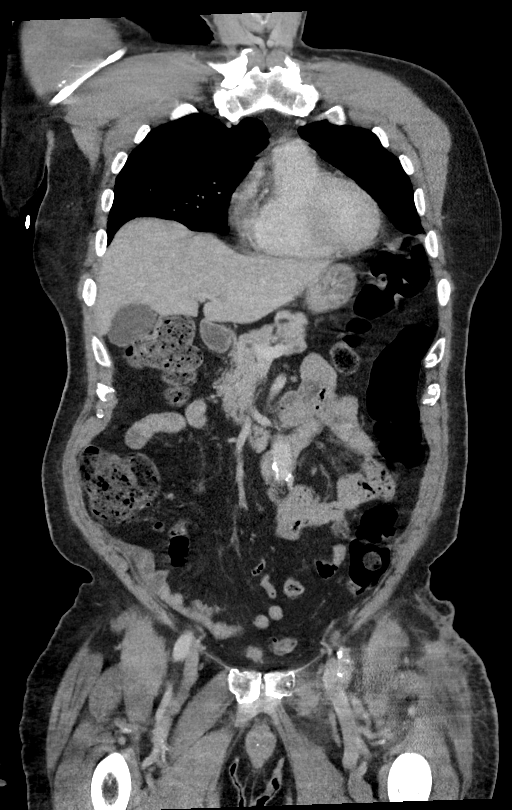
[im 96/160  mediastinal]
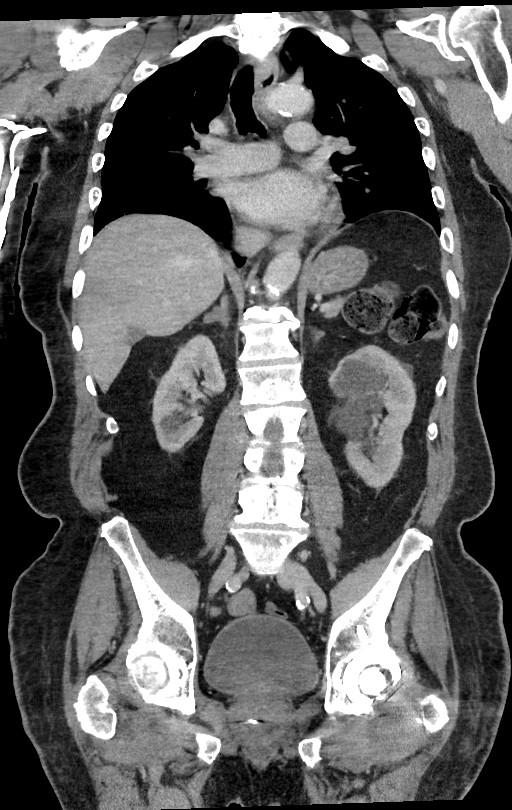

[14 of 36 positions shown; findings below may reference images not displayed]

RADIATION DOSE REDUCTION: This exam was performed according to the
departmental dose-optimization program which includes automated
exposure control, adjustment of the mA and/or kV according to
patient size and/or use of iterative reconstruction technique.

CONTRAST:  80mL OMNIPAQUE IOHEXOL 300 MG/ML  SOLN
FINDINGS: CT CHEST FINDINGS

Cardiovascular: Atheromatous calcifications, including the coronary
arteries and aorta. Normal sized heart.

Mediastinum/Nodes: No enlarged mediastinal, hilar, or axillary lymph
nodes. Thyroid gland, trachea, and esophagus demonstrate no
significant findings.

Lungs/Pleura: Bilateral dependent atelectasis.  No pleural fluid.

Musculoskeletal: Thoracic and lower cervical spine degenerative
changes. Mild bilateral gynecomastia.

CT ABDOMEN PELVIS FINDINGS

Hepatobiliary: Normal appearing gallbladder. Stable right lobe liver
probable cyst.

Pancreas: Unremarkable. No pancreatic ductal dilatation or
surrounding inflammatory changes.

Spleen: Normal in size without focal abnormality.

Adrenals/Urinary Tract: Stable mild bilateral adrenal hyperplasia.
Stable bilateral renal cysts. Unremarkable ureters and urinary
bladder.

Stomach/Bowel: Prominent stool in the right, proximal transverse and
proximal descending colon. Multiple small sigmoid colon diverticula
without evidence diverticulitis. Normal appearing appendix, small
bowel and stomach.

Vascular/Lymphatic: Atheromatous arterial calcifications without
aneurysm. No enlarged lymph nodes.

Reproductive: Moderately enlarged prostate gland containing 3 small
surgical clips or radiation seeds.

Other: No abdominal wall hernia or abnormality. No abdominopelvic
ascites.

Musculoskeletal: Left hip prosthesis. Lumbar spine degenerative
changes, including marked facet degenerative changes at the L4-5 and
L5-S1 levels with associated grade 1 anterolisthesis at the L4-5
level.
IMPRESSION: 1. No acute abnormality in the chest, abdomen or pelvis.
2.  Calcific coronary artery and aortic atherosclerosis.
3. Prominent stool in the right, proximal transverse and proximal
descending colon.
